# Patient Record
Sex: Male | Born: 1960
Health system: Southern US, Community
[De-identification: ages and names within clinical notes are randomized; demographics above are authoritative.]

## PROBLEM LIST (undated history)

## (undated) DIAGNOSIS — G4733 Obstructive sleep apnea (adult) (pediatric): Secondary | ICD-10-CM

## (undated) DIAGNOSIS — I4891 Unspecified atrial fibrillation: Secondary | ICD-10-CM

## (undated) DIAGNOSIS — I4819 Other persistent atrial fibrillation: Secondary | ICD-10-CM

## (undated) DIAGNOSIS — I519 Heart disease, unspecified: Secondary | ICD-10-CM

## (undated) DIAGNOSIS — Z974 Presence of external hearing-aid: Secondary | ICD-10-CM

## (undated) DIAGNOSIS — M1990 Unspecified osteoarthritis, unspecified site: Secondary | ICD-10-CM

## (undated) DIAGNOSIS — E785 Hyperlipidemia, unspecified: Secondary | ICD-10-CM

## (undated) HISTORY — DX: Other persistent atrial fibrillation: I48.19

## (undated) HISTORY — PX: CARDIAC CATHETERIZATION: SHX172

## (undated) HISTORY — DX: Obstructive sleep apnea (adult) (pediatric): G47.33

---

## 1997-06-16 ENCOUNTER — Other Ambulatory Visit: Admission: RE | Admit: 1997-06-16 | Discharge: 1997-06-16 | Payer: Self-pay | Admitting: Family Medicine

## 2005-04-21 ENCOUNTER — Ambulatory Visit: Payer: Self-pay | Admitting: Cardiology

## 2005-11-25 ENCOUNTER — Emergency Department: Payer: Self-pay | Admitting: Emergency Medicine

## 2005-11-25 IMAGING — CT CT CERVICAL SPINE WITHOUT CONTRAST
3 series · 16 of 33 positions shown, 19 images · non-contrast
Comparison: none

REASON FOR EXAM: fall, neck pain
COMMENTS:

[Series 3: coronal · coronal · 0.36mm/px · 3 of 37 slices shown]
[im 8/37  bone]
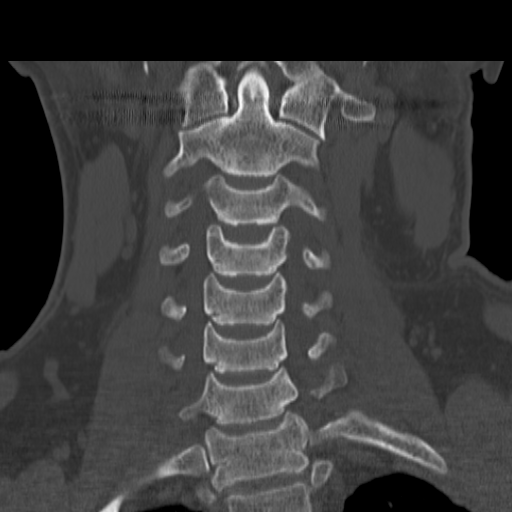
[im 15/37  bone]
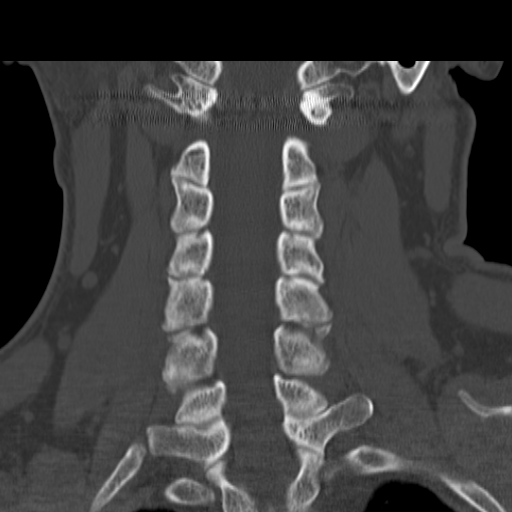
[im 22/37  bone]
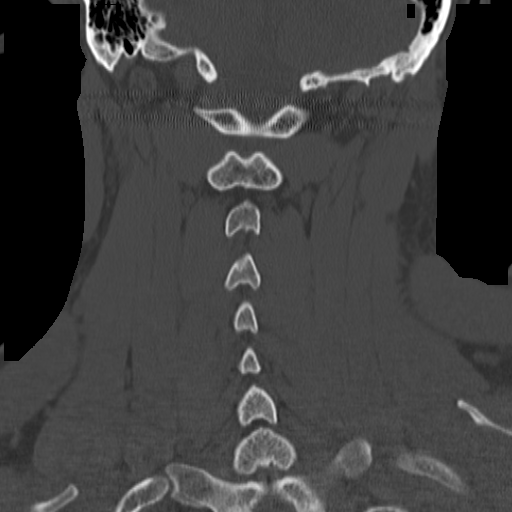

[Series 4: sagittal · sagittal · 0.35mm/px · 5 of 39 slices shown, 6 images]
[im 13/39  bone]
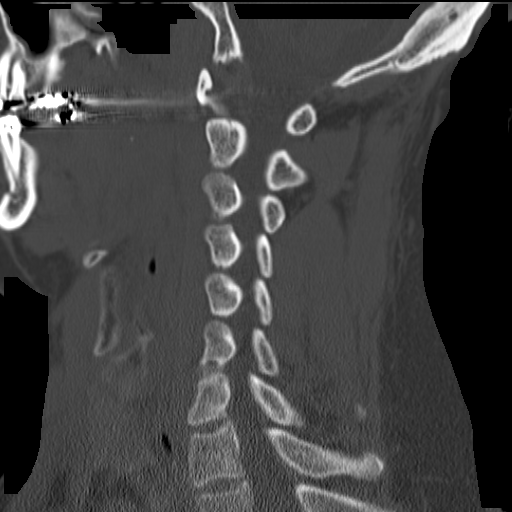
[im 16/39  bone]
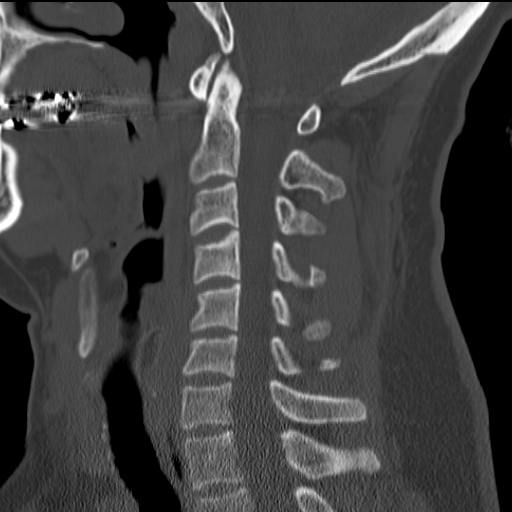
[im 20/39  soft-tissue]
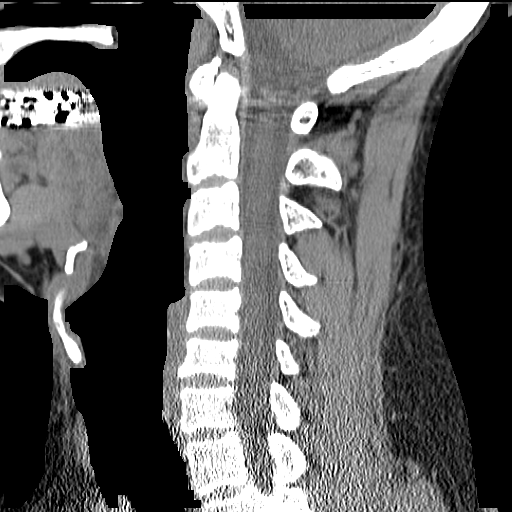
[im 20/39  bone]
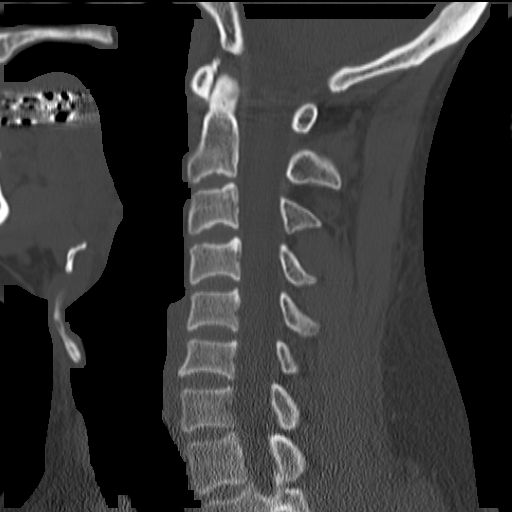
[im 23/39  bone]
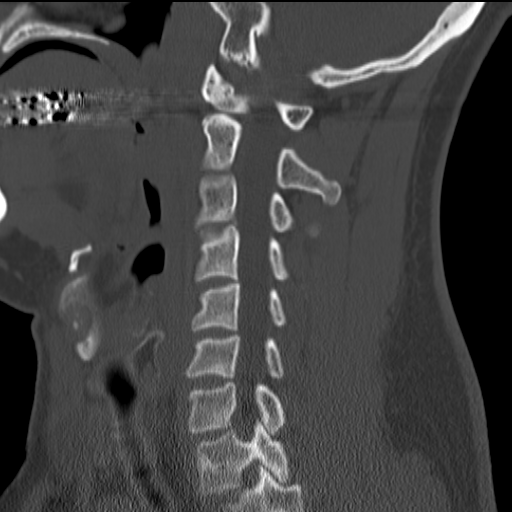
[im 26/39  bone]
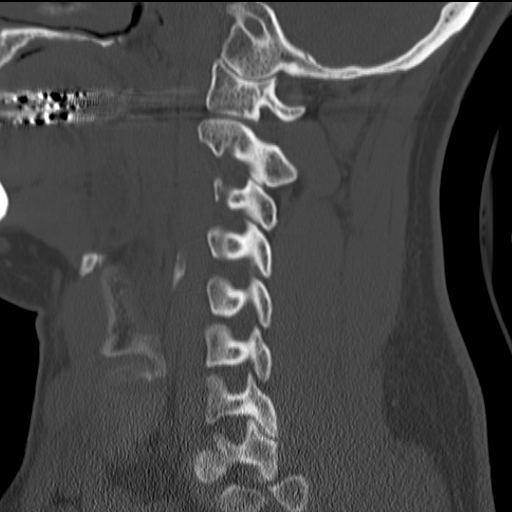

[Series 5: axial · axial · 0.32mm/px · z∈[+748,+901]mm · 8 of 91 slices shown, 10 images]
[im 7/91  soft-tissue]
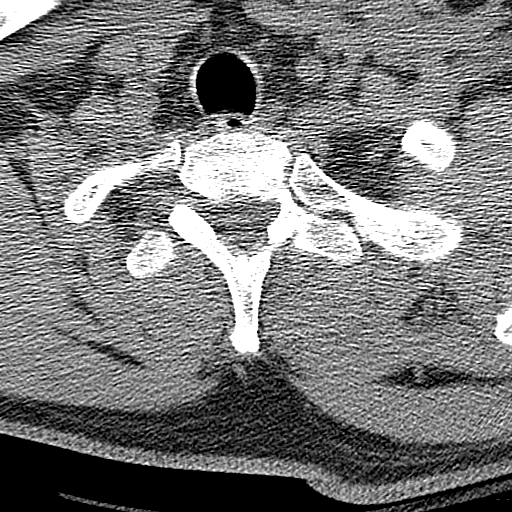
[im 7/91  bone]
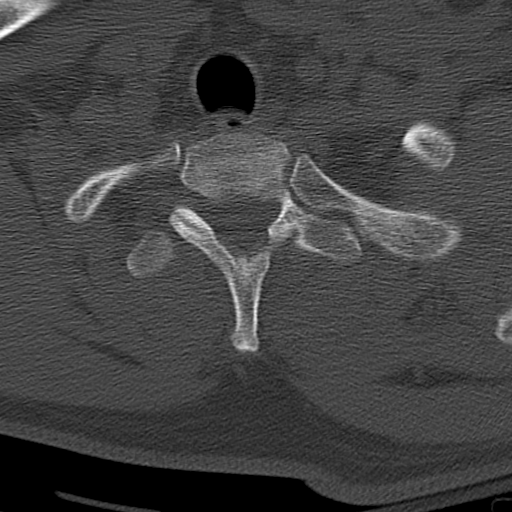
[im 21/91  bone]
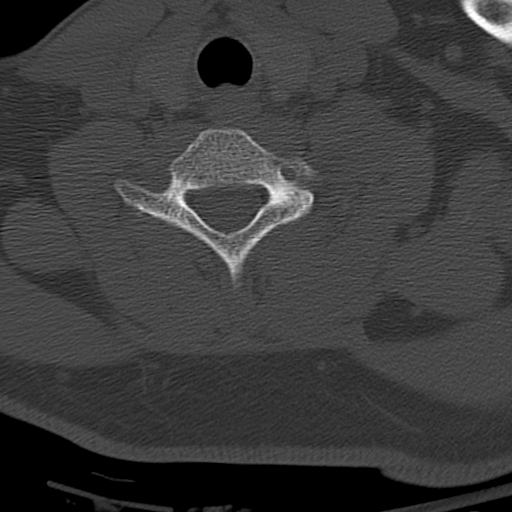
[im 28/91  bone]
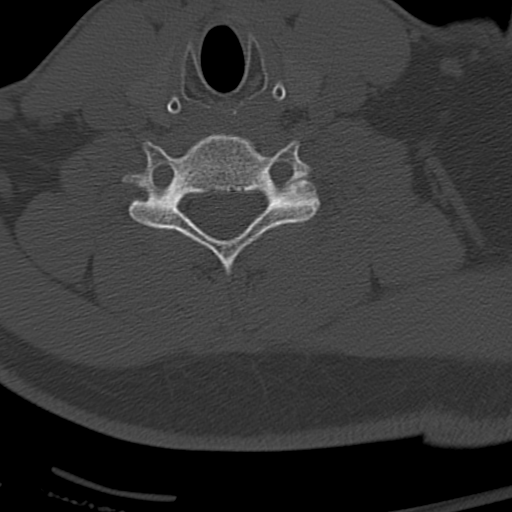
[im 42/91  bone]
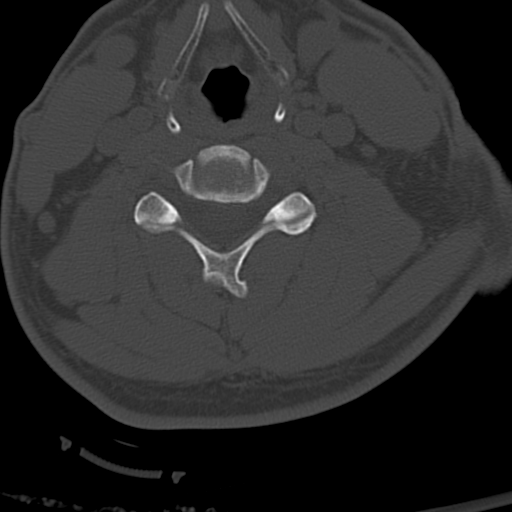
[im 49/91  soft-tissue]
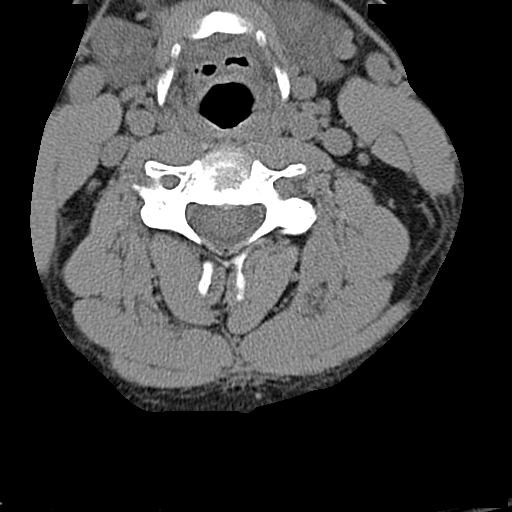
[im 49/91  bone]
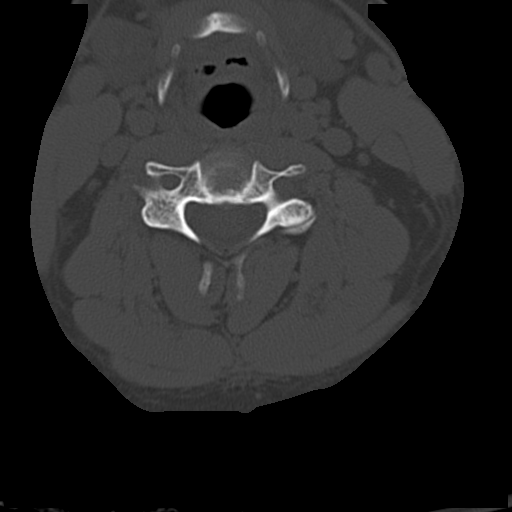
[im 63/91  bone]
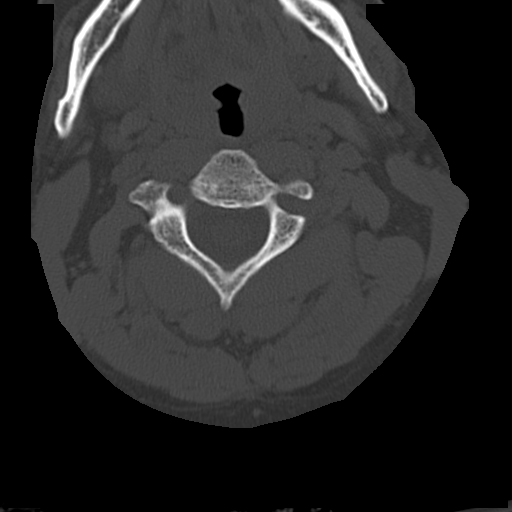
[im 70/91  bone]
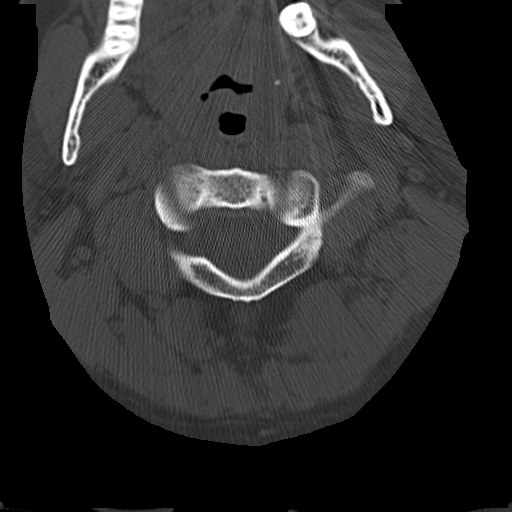
[im 84/91  bone]
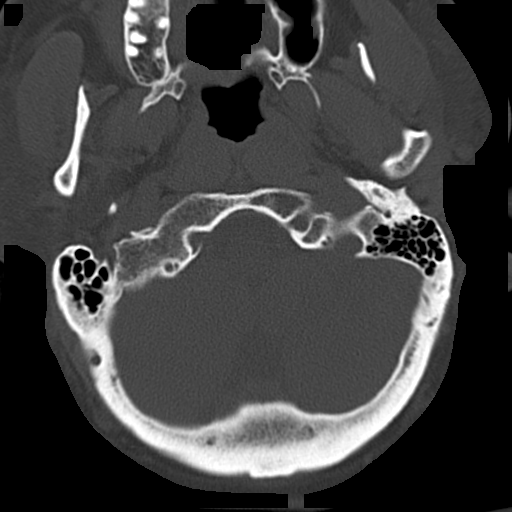

[16 of 33 positions shown; findings below may reference images not displayed]

PROCEDURE:     CT  - CT CERVICAL SPINE WO  - [DATE]  [DATE]

RESULT:          Noncontrast emergent CT scan of the cervical spine is
performed.

The vertebral body heights and intervertebral disc spaces appear to be
maintained.  There is no subluxation.  The prevertebral soft tissues are
normal.  The cervicobasilar relationships (craniocervical junction) appear
to be normal.  The odontoid appears to be intact.  The lateral masses of C1
and C2 appear to be normally aligned.  The head is rotated slightly to the
RIGHT compared to the remaining portions of the spine.
IMPRESSION: No acute bony abnormality.  No findings to suggest
acute cervical spine fracture.

The findings were phoned to the [HOSPITAL] the time of
dictation.

## 2012-08-17 ENCOUNTER — Inpatient Hospital Stay: Payer: Self-pay | Admitting: Internal Medicine

## 2012-08-17 LAB — URINALYSIS, COMPLETE
Bilirubin,UR: NEGATIVE
Glucose,UR: NEGATIVE mg/dL (ref 0–75)
Ketone: NEGATIVE
Leukocyte Esterase: NEGATIVE
Nitrite: NEGATIVE
Specific Gravity: 1.025 (ref 1.003–1.030)

## 2012-08-17 LAB — COMPREHENSIVE METABOLIC PANEL
Albumin: 4 g/dL (ref 3.4–5.0)
Alkaline Phosphatase: 77 U/L (ref 50–136)
Anion Gap: 6 — ABNORMAL LOW (ref 7–16)
BUN: 17 mg/dL (ref 7–18)
Bilirubin,Total: 1 mg/dL (ref 0.2–1.0)
Chloride: 108 mmol/L — ABNORMAL HIGH (ref 98–107)
Creatinine: 0.97 mg/dL (ref 0.60–1.30)
EGFR (Non-African Amer.): >60
Glucose: 95 mg/dL (ref 65–99)
SGOT(AST): 34 U/L (ref 15–37)
SGPT (ALT): 107 U/L — ABNORMAL HIGH (ref 12–78)
Sodium: 138 mmol/L (ref 136–145)
Total Protein: 7.4 g/dL (ref 6.4–8.2)

## 2012-08-17 LAB — CK TOTAL AND CKMB (NOT AT ARMC)
CK, Total: 92 U/L (ref 35–232)
CK-MB: 0.9 ng/mL (ref 0.5–3.6)

## 2012-08-17 LAB — CBC
HCT: 44.9 % (ref 40.0–52.0)
HGB: 15 g/dL (ref 13.0–18.0)
MCH: 30.7 pg (ref 26.0–34.0)
MCHC: 33.5 g/dL (ref 32.0–36.0)
Platelet: 192 10*3/uL (ref 150–440)
RBC: 4.9 10*6/uL (ref 4.40–5.90)
RDW: 14 % (ref 11.5–14.5)

## 2012-08-17 LAB — PRO B NATRIURETIC PEPTIDE: B-Type Natriuretic Peptide: 482 pg/mL — ABNORMAL HIGH (ref 0–125)

## 2012-08-17 LAB — TROPONIN I: Troponin-I: <0.02 ng/mL

## 2012-08-18 LAB — COMPREHENSIVE METABOLIC PANEL
Anion Gap: 8 (ref 7–16)
BUN: 13 mg/dL (ref 7–18)
Bilirubin,Total: 0.6 mg/dL (ref 0.2–1.0)
Calcium, Total: 8.1 mg/dL — ABNORMAL LOW (ref 8.5–10.1)
EGFR (African American): >60
EGFR (Non-African Amer.): >60
Glucose: 102 mg/dL — ABNORMAL HIGH (ref 65–99)
Osmolality: 280 (ref 275–301)
Potassium: 3.7 mmol/L (ref 3.5–5.1)
SGPT (ALT): 75 U/L (ref 12–78)
Sodium: 140 mmol/L (ref 136–145)
Total Protein: 6.1 g/dL — ABNORMAL LOW (ref 6.4–8.2)

## 2012-08-18 LAB — CBC WITH DIFFERENTIAL/PLATELET
Basophil %: 0.6 %
Eosinophil %: 2.6 %
HGB: 13 g/dL (ref 13.0–18.0)
MCHC: 33.4 g/dL (ref 32.0–36.0)
Monocyte #: 0.7 x10 3/mm (ref 0.2–1.0)
Monocyte %: 11.4 %
Neutrophil #: 3.3 10*3/uL (ref 1.4–6.5)
Platelet: 159 10*3/uL (ref 150–440)
RBC: 4.26 10*6/uL — ABNORMAL LOW (ref 4.40–5.90)
WBC: 5.8 10*3/uL (ref 3.8–10.6)

## 2012-08-18 LAB — MAGNESIUM: Magnesium: 1.8 mg/dL

## 2012-08-18 LAB — TROPONIN I: Troponin-I: <0.02 ng/mL

## 2012-08-19 LAB — COMPREHENSIVE METABOLIC PANEL
Albumin: 3.1 g/dL — ABNORMAL LOW (ref 3.4–5.0)
Alkaline Phosphatase: 68 U/L (ref 50–136)
BUN: 15 mg/dL (ref 7–18)
Bilirubin,Total: 0.4 mg/dL (ref 0.2–1.0)
Calcium, Total: 8 mg/dL — ABNORMAL LOW (ref 8.5–10.1)
Co2: 28 mmol/L (ref 21–32)
Creatinine: 1.1 mg/dL (ref 0.60–1.30)
EGFR (African American): >60
Glucose: 85 mg/dL (ref 65–99)
Osmolality: 287 (ref 275–301)
SGOT(AST): 17 U/L (ref 15–37)
SGPT (ALT): 60 U/L (ref 12–78)
Sodium: 144 mmol/L (ref 136–145)

## 2012-08-19 LAB — CBC WITH DIFFERENTIAL/PLATELET
Basophil #: 0 10*3/uL (ref 0.0–0.1)
Basophil %: 0.8 %
Eosinophil #: 0.2 10*3/uL (ref 0.0–0.7)
HCT: 37.5 % — ABNORMAL LOW (ref 40.0–52.0)
HGB: 12.8 g/dL — ABNORMAL LOW (ref 13.0–18.0)
Lymphocyte #: 1.5 10*3/uL (ref 1.0–3.6)
Lymphocyte %: 29.6 %
MCHC: 34 g/dL (ref 32.0–36.0)
MCV: 91 fL (ref 80–100)
Monocyte %: 12.3 %
Neutrophil #: 2.8 10*3/uL (ref 1.4–6.5)
Neutrophil %: 53.9 %
Platelet: 154 10*3/uL (ref 150–440)
RBC: 4.12 10*6/uL — ABNORMAL LOW (ref 4.40–5.90)
RDW: 14 % (ref 11.5–14.5)
WBC: 5.2 10*3/uL (ref 3.8–10.6)

## 2013-05-11 ENCOUNTER — Ambulatory Visit: Payer: Self-pay | Admitting: Internal Medicine

## 2013-07-22 DIAGNOSIS — I429 Cardiomyopathy, unspecified: Secondary | ICD-10-CM | POA: Insufficient documentation

## 2013-07-22 DIAGNOSIS — E782 Mixed hyperlipidemia: Secondary | ICD-10-CM | POA: Insufficient documentation

## 2013-07-22 DIAGNOSIS — K219 Gastro-esophageal reflux disease without esophagitis: Secondary | ICD-10-CM | POA: Insufficient documentation

## 2013-09-06 ENCOUNTER — Observation Stay: Payer: Self-pay | Admitting: Internal Medicine

## 2013-09-06 LAB — BASIC METABOLIC PANEL
Anion Gap: 10 (ref 7–16)
BUN: 17 mg/dL (ref 7–18)
Calcium, Total: 9 mg/dL (ref 8.5–10.1)
Chloride: 109 mmol/L — ABNORMAL HIGH (ref 98–107)
Co2: 19 mmol/L — ABNORMAL LOW (ref 21–32)
Creatinine: 1.35 mg/dL — ABNORMAL HIGH (ref 0.60–1.30)
EGFR (African American): >60
EGFR (Non-African Amer.): >60
Glucose: 174 mg/dL — ABNORMAL HIGH (ref 65–99)
Osmolality: 281 (ref 275–301)
POTASSIUM: 3.8 mmol/L (ref 3.5–5.1)
SODIUM: 138 mmol/L (ref 136–145)

## 2013-09-06 LAB — CBC
HCT: 46.4 % (ref 40.0–52.0)
HGB: 15.1 g/dL (ref 13.0–18.0)
MCH: 30.2 pg (ref 26.0–34.0)
MCHC: 32.5 g/dL (ref 32.0–36.0)
MCV: 93 fL (ref 80–100)
PLATELETS: 236 10*3/uL (ref 150–440)
RBC: 4.99 10*6/uL (ref 4.40–5.90)
RDW: 12.9 % (ref 11.5–14.5)
WBC: 6.4 10*3/uL (ref 3.8–10.6)

## 2013-09-06 LAB — MAGNESIUM: Magnesium: 1.8 mg/dL

## 2013-09-06 LAB — CK TOTAL AND CKMB (NOT AT ARMC)
CK, Total: 112 U/L
CK-MB: 1.1 ng/mL (ref 0.5–3.6)

## 2013-09-06 LAB — APTT: Activated PTT: 33.6 secs (ref 23.6–35.9)

## 2013-09-06 LAB — PROTIME-INR
INR: 1.2
Prothrombin Time: 15.1 secs — ABNORMAL HIGH (ref 11.5–14.7)

## 2013-09-06 LAB — PRO B NATRIURETIC PEPTIDE: B-Type Natriuretic Peptide: 685 pg/mL — ABNORMAL HIGH (ref 0–125)

## 2013-09-06 LAB — HEMOGLOBIN A1C: Hemoglobin A1C: 5.9 % (ref 4.2–6.3)

## 2013-09-06 LAB — TROPONIN I

## 2013-10-13 DIAGNOSIS — G4733 Obstructive sleep apnea (adult) (pediatric): Secondary | ICD-10-CM | POA: Insufficient documentation

## 2014-01-27 DIAGNOSIS — I480 Paroxysmal atrial fibrillation: Secondary | ICD-10-CM | POA: Insufficient documentation

## 2014-03-23 DIAGNOSIS — R001 Bradycardia, unspecified: Secondary | ICD-10-CM | POA: Insufficient documentation

## 2014-06-09 NOTE — Consult Note (Signed)
General Aspect 54 year old male that presented to the office yesterday with new onset A. fib with RVR and evaluated by Dr. Nehemiah Massed.  He was started on Eliquis, and Cardizem  240 mg by mouth x 2 doses.  He returned to the office today, with a systolic blood pressure of 96 and complaining of being dizzy, lightheaded and started complaining of chest pain, diaphoresis, shortness of breath.  Repeat EKG showed ongoing A. fib with RVR and  at 180 bpm.  I discussed with Dr. Nehemiah Massed  who recommended  be sent to the emergency room for admission, continuation of Cardizem with amiodarone drip, and plans for TEE guided cardioversion in the morning. In  the ER patient has shown improvement in rate with IV drugs. blood pressure has improved and cardiac enzymes have been negative.   Physical Exam:  GEN in mild distress   HEENT pink conjunctivae   NECK trachea midline   RESP feels short of breath   CARD Irregular rate and rhythm  Tachycardic  No murmur   ABD denies tenderness   EXTR negative edema   SKIN normal to palpation   NEURO cranial nerves intact, motor/sensory function intact   PSYCH alert, A+O to time, place, person, good insight, anxious   Review of Systems:  Subjective/Chief Complaint Rapid heartbeat associated with lightheadedness shortness of breath, chest pain   General: Weakness  for last 2 weeks   Skin: No Complaints   ENT: No Complaints   Eyes: No Complaints   Neck: No Complaints   Respiratory: Short of breath   Cardiovascular: Chest pain or discomfort  Tightness  Palpitations  Dyspnea   Gastrointestinal: No Complaints   Genitourinary: No Complaints   Vascular: No Complaints   Musculoskeletal: No Complaints   Neurologic: Dizzness   Hematologic: No Complaints   Endocrine: No Complaints   Psychiatric: Nervousness  Anxiety   Review of Systems: All other systems were reviewed and found to be negative   Medications/Allergies Reviewed Medications/Allergies  reviewed   Lab Results: Thyroid:  01-Jul-14 11:49   Thyroid Stimulating Hormone 1.81 (0.45-4.50 (International Unit)  ----------------------- Pregnant patients have  different reference  ranges for TSH:  - - - - - - - - - -  Pregnant, first trimetser:  0.36 - 2.50 uIU/mL)  Hepatic:  01-Jul-14 11:49   Bilirubin, Total 1.0  Alkaline Phosphatase 77  SGPT (ALT)  107  SGOT (AST) 34  Total Protein, Serum 7.4  Albumin, Serum 4.0  Routine Chem:  01-Jul-14 11:49   B-Type Natriuretic Peptide ([facility])  482 (Result(s) reported on 17 Aug 2012 at 12:22PM.)  Glucose, Serum 95  BUN 17  Creatinine (comp) 0.97  Sodium, Serum 138  Potassium, Serum 4.3  Chloride, Serum  108  CO2, Serum 24  Calcium (Total), Serum 8.8  Osmolality (calc) 277  eGFR (African American) >60  eGFR (Non-African American) >60 (eGFR values <54mL/min/1.73 m2 may be an indication of chronic kidney disease (CKD). Calculated eGFR is useful in patients with stable renal function. The eGFR calculation will not be reliable in acutely ill patients when serum creatinine is changing rapidly. It is not useful in  patients on dialysis. The eGFR calculation may not be applicable to patients at the low and high extremes of body sizes, pregnant women, and vegetarians.)  Anion Gap  6  Magnesium, Serum  1.7 (1.8-2.4 THERAPEUTIC RANGE: 4-7 mg/dL TOXIC: > 10 mg/dL  -----------------------)  Cardiac:  01-Jul-14 11:49   CK, Total 92  CPK-MB, Serum 0.9 (Result(s) reported  on 17 Aug 2012 at 12:22PM.)  Troponin I < 0.02 (0.00-0.05 0.05 ng/mL or less: NEGATIVE  Repeat testing in 3-6 hrs  if clinically indicated. >0.05 ng/mL: POTENTIAL  MYOCARDIAL INJURY. Repeat  testing in 3-6 hrs if  clinically indicated. NOTE: An increase or decrease  of 30% or more on serial  testing suggests a  clinically important change)  Routine Hem:  01-Jul-14 11:49   WBC (CBC) 6.2  RBC (CBC) 4.90  Hemoglobin (CBC) 15.0  Hematocrit (CBC) 44.9   Platelet Count (CBC) 192 (Result(s) reported on 17 Aug 2012 at 12:03PM.)  MCV 92  MCH 30.7  MCHC 33.5  RDW 14.0   Radiology Results: XRay:    01-Jul-14 11:54, Chest Portable Single View  Chest Portable Single View   REASON FOR EXAM:    dyspnea  COMMENTS:       PROCEDURE: DXR - DXR PORTABLE CHEST SINGLE VIEW  - Aug 17 2012 11:54AM     RESULT: The lungs are well-expanded. There is no focal infiltrate. The   cardiac silhouette is enlarged. The central pulmonary vascularity is   prominent. There is no interstitial nor alveolar edema or pleural   effusion. The mediastinum is normal in width.    IMPRESSION:  The findings suggest low-grade CHF without significant   pulmonary edema.     Dictation Site: 2    VerifiedBy: DAVID A. Martinique, M.D., MD    No Known Allergies:   Vital Signs/Nurse's Notes: **Vital Signs.:   01-Jul-14 13:10  Vital Signs Type Admission  Temperature Temperature (F) 97.9  Celsius 36.6  Temperature Source oral  Pulse Pulse 277  Systolic BP Systolic BP 824  Diastolic BP (mmHg) Diastolic BP (mmHg) 77  Mean BP 90  Pulse Ox % Pulse Ox % 95  Pulse Ox Activity Level  At rest  Oxygen Delivery Room Air/ 21 %  Pulse Ox Heart Rate 84    Impression New-onset A. fib with RVR,  less than optimal response to outpatient by mouth medication, with unstable symptoms.   Plan 1.  Continue Cardizem drip which after 2 IV doses patient showed some improvement in ventricular response with increase in blood pressure. 2.  Amiodarone drip for hopeful Maddon Horton or help with maintenance of sinus rhythm after  possible cardioversion. 3.  Continue Eloquis  for reduction of stroke risk. 3.  Plans for TEE guided cardioversion  tomorrow a.m. by Dr. Nehemiah Massed.  Patient was seen in collaboration with Dr. Nehemiah Massed.   Electronic Signatures: Roderic Palau (NP)  (Signed 01-Jul-14 15:09)  Authored: General Aspect/Present Illness, History and Physical Exam, Review of System, Labs,  Radiology, Allergies, Vital Signs/Nurse's Notes, Impression/Plan   Last Updated: 01-Jul-14 15:09 by Roderic Palau (NP)

## 2014-06-09 NOTE — H&P (Signed)
PATIENT NAME:  Francisco Reed, Francisco Reed MR#:  [id number] DATE OF BIRTH:  [birth year]  DATE OF ADMISSION:  08/17/2012  FAMILY PHYSICIAN:  Leonie Douglas. Doy Hutching, M.D.   REASON FOR ADMISSION: Rapid atrial fibrillation.   HISTORY OF PRESENT ILLNESS: The patient is a 54 year old male with a history of noncardiac chest pain with cardiac catheterization in 2007, which was normal. He presented to the office yesterday with a 2 week history of shortness of breath, fatigue and abdominal fullness. Was found to be in rapid atrial fibrillation. Was seen by cardiology and started on Cardizem CD and Eliquis. Represented today with persistent symptoms. Remained in rapid A. fib. He is now admitted for further evaluation.   PAST MEDICAL HISTORY:  1.  History of noncardiac chest pain with negative cardiac catheterization in 2007.   MEDICATIONS: 1.  Cardizem CD 240 mg p.o. daily.  2.  Eliquis 5 mg p.o. b.i.d.   ALLERGIES: No known drug allergies.   SOCIAL HISTORY: The patient is married with 2 children. No history of alcohol or tobacco abuse.   FAMILY HISTORY: Positive for coronary artery disease and atrial fibrillation. Negative for prostate or colon cancer.   REVIEW OF SYSTEMS:  CONSTITUTIONAL: No fever or change in weight.  EYES: No blurred or double vision. No glaucoma.  ENT: No tinnitus or hearing loss. No nasal discharge or bleeding. No difficulty swallowing.  RESPIRATORY: The patient has had cough and shortness of breath. No hemoptysis. No painful respiration.  CARDIOVASCULAR: No chest pain. Some orthopnea. Some palpitations. No syncope.  GASTROINTESTINAL: No nausea, vomiting or diarrhea. No change in bowel habits.  GENITOURINARY: No dysuria or hematuria. No incontinence.  ENDOCRINE: No polyuria or polydipsia. No heat or cold intolerance.  HEMATOLOGIC: The patient denies anemia, easy bruising or bleeding.  LYMPHATIC: No swollen glands.  MUSCULOSKELETAL: The patient denies pain in his neck, back, shoulders,  knees, hips. No gout.  NEUROLOGIC: No numbness or migraines. Denies stroke or seizures.  PSYCH: The patient denies anxiety, insomnia or depression.   PHYSICAL EXAMINATION: GENERAL: The patient is in moderate distress.  VITAL SIGNS: Currently remarkable for a blood pressure of 128/82 with a heart rate of 170, which is irregular. Respiratory rate of 20. He is afebrile. Sats 99% on room air.  HEENT: Normocephalic, atraumatic. Pupils equally round and reactive to light and accommodation. Extraocular movements are intact. Sclerae anicteric. Conjunctivae are clear.  Oropharynx is clear.  NECK: Supple without JVD or bruits. No adenopathy or thyromegaly was noted.  LUNGS:  Reveal basilar rhonchi with no wheezes or rales. No dullness. Respiratory effort is mildly increased.  CARDIAC: Irregularly irregular rhythm with a rapid rate. No significant rubs or gallops noted. PMI is nondisplaced. Chest wall is nontender.  ABDOMEN: Soft, nontender with normoactive bowel sounds. No organomegaly or masses were appreciated. No hernias or bruits were noted.  EXTREMITIES: Without clubbing, cyanosis or edema. Pulses were 2+ bilaterally.  SKIN: Warm and dry without rash or lesions.  NEUROLOGIC:  Reveal cranial nerves II through XII grossly intact. Deep tendon reflexes were symmetric. Motor and sensory exam is nonfocal.  PSYCHIATRIC:  Revealed the patient is alert and oriented to person, place and time. He was cooperative and used good judgment.   DIAGNOSTIC, RADIOLOGIC AND LABORATORY DATA: Magnesium was low at 1.7. Glucose 95 with a BUN of 17, creatinine 0.97 with a sodium of 138 and a potassium of 4.3. CBC was within normal limits. EKG: Revealed rapid atrial fibrillation with no acute ischemic changes. Chest x-ray was unremarkable.  ASSESSMENT: 1.  New onset rapid atrial fibrillation.  2.  Hypomagnesemia.  3.  Shortness of breath.  4.  Generalized weakness and fatigue.  5.  History of normal cardiac  catheterization in 2007.   PLAN: The patient will be started on amiodarone and diltiazem drips and admitted to the intensive care unit. We will continue Eliquis at this time. We will follow serial cardiac enzymes. We will obtain a cardiology consult. The patient was seen by Dr. Nehemiah Massed as an outpatient yesterday. The plan is to begin the amiodarone and Cardizem drips and proceed with a transesophageal echo followed by cardioversion tomorrow. We will supplement oxygen at this time empirically and wean as tolerated. Follow up routine labs in the morning. We will supplement his magnesium at this time. Further treatment and evaluation will depend upon the patient's progress.   TOTAL SPENT ON THIS PATIENT: 45 minutes.     ____________________________ Leonie Douglas Doy Hutching, MD jds:dmm D: 08/17/2012 12:21:54 ET T: 08/17/2012 12:39:39 ET JOB#: [id number]  cc: Leonie Douglas. Doy Hutching, MD, <Dictator> Nadyne Gariepy Lennice Sites MD ELECTRONICALLY SIGNED 08/17/2012 14:44

## 2014-06-10 NOTE — Consult Note (Signed)
PATIENT NAME:  Francisco Reed, Francisco Reed MR#:  [id number] DATE OF BIRTH:  1960/07/01  DATE OF CONSULTATION:  09/06/2013  REFERRING PHYSICIAN:  Dr. Doy Hutching.   CARDIOLOGIST:  Dr. Nehemiah Massed.  CONSULTING PHYSICIAN:  Dwayne D. Callwood, MD  INDICATION: Atrial fibrillation.   HISTORY OF PRESENT ILLNESS:  Francisco Reed is a 54 year old white male with known history of hypertension, with atrial fibrillation off and on recently.  He saw Dr. Nehemiah Massed when  his atrial fibrillation became uncontrolled again. Over the last week or so he had been stable.  The patient complains of palpitations, irregular heart beat, tachycardia.  He was seen by Dr. Nehemiah Massed and placed on verapamil, metoprolol and flecainide as well as Eliquis. He was doing reasonably well with reasonable rate control but then started having recurrent tachycardia with heart rates of 180.  He came to the Emergency Room after seeing Dr. Doy Hutching. The patient had some improvement in his rate and now is here with recurrent atrial fibrillation. He has had palpitations, tachycardia, no significant near-syncope, and now needs further evaluation and consideration for cardioversion.   REVIEW OF SYSTEMS: No blackout spells or syncope. Denies nausea or vomiting, had some sweating, weakness, fatigue. No weight loss, no weight gain.  No hemoptysis, no hematemesis. No bright red blood per rectum.   PAST MEDICAL HISTORY: Mild obesity, mild hypertension, paroxysmal atrial fibrillation.  FAMILY HISTORY: Atrial fibrillation, myocardial infarction.   SOCIAL HISTORY: Married. Works. Denies smoking or alcohol consumption.   ALLERGIES: No known drug allergies.  PHYSICAL EXAMINATION:  VITAL SIGNS: Blood pressure 104/90, pulse 77, respiratory rate 14, afebrile.  HEENT: Normocephalic, atraumatic. Pupils equal and reactive to light.  NECK: Supple. No significant JVD, adenopathy.  LUNGS: Clear to auscultation and percussion. No significant wheeze, rhonchi, or rale.  HEART:  Irregularly irregular. Systolic ejection murmur at the apex.  ABDOMEN: Benign. EXTREMITIES: Within normal limits.  NEUROLOGIC: Intact.  SKIN: Normal.   LABORATORY DATA: Troponin less than 0.02, white count 64, H and H 15 and 45, platelet count 236,000, glucose 174, BUN 17, creatinine 1.35, sodium 138, potassium 3.8, PT 15, PTT 33. BNP 685, hemoglobin A1c 5.9.   EKG: Atrial fibrillation, rate of about 80, nonspecific ST-T wave changes.   ASSESSMENT: Atrial fibrillation, difficult to control; palpitations, tachycardia, hypertension.   PLAN:  Agree with admission. Place on telemetry. Continue anticoagulation and consider whether cardioversion is indicated at this point, either with a TEE and/or defer TEE and go directly to cardioversion.  The patient appears to have not been in atrial fibrillation for very long. We will recommend therapy with flecainide. Would consider increasing the dose from 50 twice a day to 100 twice a day. He is on verapamil and metoprolol, appears to be rate-controlled at this point. Continue Eliquis for anticoagulation. Again, I will discuss the case with Dr. Nehemiah Massed.  The goal is probably cardioversion with strong consideration eventually for ablation in this patient since he has been difficult to control.    ____________________________ Loran Senters. Clayborn Bigness, MD ddc:lt D: 09/07/2013 09:19:51 ET T: 09/07/2013 09:59:03 ET JOB#: [id number]  cc: Dwayne D. Clayborn Bigness, MD, <Dictator> Yolonda Kida MD ELECTRONICALLY SIGNED 10/03/2013 10:22

## 2014-06-10 NOTE — H&P (Signed)
PATIENT NAME:  Francisco Reed, Francisco Reed MR#:  [id number] DATE OF BIRTH:  1960-05-22  DATE OF ADMISSION:  09/06/2013  PRIMARY CARE PHYSICIAN:  Dr. Doy Hutching.   CHIEF COMPLAINT:  Palpitations. Not feeling well.   HISTORY OF PRESENT ILLNESS:  Francisco Reed is a 54 year old Caucasian gentleman with past medical history of chronic atrial fibrillation, on p.o. anticoagulation with Eliquis, who comes to the Emergency Room after he started having weakness, felt palpitations.  His heart rate when monitored was up in the 200s.  He has recently had a visit to Dr. Alveria Apley office where verapamil, flecainide and Eliquis were added.  His metoprolol was also increased recently, about 3-4 days ago.  The patient has been feeling weak, was found to have a heart rate in the 200s. When he came to the Emergency Room his heart rate was in the 70s, however, he was hypotensive, had a presyncopal episode without any fall or loss of consciousness at work.  He is being admitted for further evaluation and management.   The patient's chronic atrial fibrillation was diagnosed in July 2014. He had undergone cardioversion last year due to irregular and labile heart rate. The patient at present denies any chest pain or shortness of breath.  His heart rate during my evaluation is 63-70, atrial fibrillation, with blood pressure of 117/51.    PAST MEDICAL HISTORY:  Atrial fibrillation.   ALLERGIES:  No known drug allergies.   MEDICATIONS:   1.  Verapamil 240 mg p.o. daily.  2.  Metoprolol succinate 25 mg b.i.d.  3.  Flecainide 50 mg b.i.d.  4.  Eliquis 5 mg b.i.d.   SOCIAL HISTORY:  The patient works in [employer].   FAMILY HISTORY:  Positive for CAD and atrial fibrillation.   REVIEW OF SYSTEMS:  CONSTITUTIONAL:  No fever. Positive for fatigue, weakness.  EYES:  No blurred or double vision, glaucoma, or cataract.  ENT:  No tinnitus, ear pain or hearing loss. RESPIRATORY: No cough, wheezing, hemoptysis or painful respiratory.   CARDIOVASCULAR: Positive for palpitations and presyncope. No chest pain or orthopnea, edema.  GASTROINTESTINAL: No nausea, vomiting, diarrhea or abdominal pain.  GENITOURINARY: No dysuria, hematuria, or frequency.  ENDOCRINE:  No polyuria, nocturia or thyroid problems.  HEMATOLOGY:  No anemia, easy bruising or bleeding.  SKIN:  No acne, rash or lesion.  NEUROLOGICAL: No CVA, TIA, migraines or dysarthria.  PSYCHIATRIC: No anxiety, depression or bipolar disorder.   All other systems reviewed are negative.   PHYSICAL EXAMINATION:  GENERAL: The patient is awake, alert and oriented x 3. In no acute distress.  VITAL SIGNS:  Afebrile. Pulse 63-77 and regular. Blood pressure 104/87, pulse oximetry 99% on room air.  HEENT:  Atraumatic, normocephalic, PERRLA, OMI intact. Oral mucosa is moist.   NECK:  Supple.  No JVD, no carotid bruits.  RESPIRATORY:  Clear to auscultation bilaterally.  No rales, rhonci, respiratory distress or labored breathing.  CARDIOVASCULAR:  Regular rate and rhythm, no murmur heard. PMI not lateralized.  CHEST:  Nontender.  EXTREMITIES:  Good pedal pulses, good femoral pulses, no lower extremity edema.  ABDOMEN:  Soft, benign, nontender.  No organomegaly.  Positive bowel sounds.  NEUROLOGICAL:  Grossly intact.  Cranial nerves II-XII intact. No motor or sensory deficits.  PSYCHIATRIC: Patient is awake, alert and oriented x 3.   EKG:  Shows chronic atrial fibrillation with heart rate in the 70s.   CHEST X-RAY:  No active disease.   LABORATORY DATA: CBC within normal limits.  Troponin less than  0.02.  Glucose is 174, BUN 17, creatinine 1.35, sodium 138, potassium 3.8, chloride 109, bicarbonate 19, calcium 9.0, PT-INR 15.1/1.2.  B-type natriuretic peptide 685.    ASSESSMENT:  Francisco Reed is a 54 year old male with history of chronic atrial fibrillation comes in with:  1.  Rapid atrial fibrillation with rapid ventricular response, heart rate in the 200s. The patient came  into the Emergency Room.  Heart rate was down in the 70s-80s.  He was asymptomatic, however, earlier did have a presyncopal episode.  The patient had required cardioversion in the past given his labile heart rate. We will admit the patient to the telemetry floor.  The case was discussed with Dr. Clayborn Bigness who will see the patient in consultation.  I will continue his home medications, verapamil, Toprol XL, flecainide and Eliquis. Cycle cardiac enzymes x 3.  I will hold off on any echocardiogram since one was done last year. Will await further cardiology recommendation.  2.  Hyperglycemia. Check A1c, monitor sugars; treatment accordingly.  3.  DVT prophylaxis. Already on Eliquis.  4.  Further workup according to the patient's clinical course. Hospital admission plan was discussed with the patient and his wife.   TIME SPENT:  50 minutes.    ____________________________ Hart Rochester Posey Pronto, MD sap:lt D: 09/06/2013 14:59:00 ET T: 09/06/2013 15:37:25 ET JOB#: [id number]  cc: Leonie Douglas. Doy Hutching, MD Terrian Ridlon A. Posey Pronto, MD, <Dictator>     Ilda Basset MD ELECTRONICALLY SIGNED 09/28/2013 11:31

## 2014-12-28 ENCOUNTER — Encounter
Admission: RE | Disposition: A | Discharge status: Home / Self Care | Payer: Self-pay | Source: Ambulatory Visit | Attending: Internal Medicine

## 2014-12-28 ENCOUNTER — Encounter: Payer: Self-pay | Admitting: Anesthesiology

## 2014-12-28 ENCOUNTER — Ambulatory Visit: Payer: BLUE CROSS/BLUE SHIELD | Admitting: Anesthesiology

## 2014-12-28 ENCOUNTER — Ambulatory Visit
Admission: RE | Admit: 2014-12-28 | Discharge: 2014-12-28 | Disposition: A | Discharge status: Home / Self Care | Payer: BLUE CROSS/BLUE SHIELD | Source: Ambulatory Visit | Attending: Internal Medicine | Admitting: Internal Medicine

## 2014-12-28 DIAGNOSIS — Z79899 Other long term (current) drug therapy: Secondary | ICD-10-CM | POA: Insufficient documentation

## 2014-12-28 DIAGNOSIS — E785 Hyperlipidemia, unspecified: Secondary | ICD-10-CM | POA: Insufficient documentation

## 2014-12-28 DIAGNOSIS — Z8249 Family history of ischemic heart disease and other diseases of the circulatory system: Secondary | ICD-10-CM | POA: Insufficient documentation

## 2014-12-28 DIAGNOSIS — I480 Paroxysmal atrial fibrillation: Secondary | ICD-10-CM | POA: Insufficient documentation

## 2014-12-28 DIAGNOSIS — Z7901 Long term (current) use of anticoagulants: Secondary | ICD-10-CM | POA: Insufficient documentation

## 2014-12-28 DIAGNOSIS — I428 Other cardiomyopathies: Secondary | ICD-10-CM | POA: Insufficient documentation

## 2014-12-28 DIAGNOSIS — R079 Chest pain, unspecified: Secondary | ICD-10-CM | POA: Diagnosis not present

## 2014-12-28 DIAGNOSIS — I4891 Unspecified atrial fibrillation: Secondary | ICD-10-CM | POA: Diagnosis present

## 2014-12-28 HISTORY — PX: ELECTROPHYSIOLOGIC STUDY: SHX172A

## 2014-12-28 HISTORY — DX: Hyperlipidemia, unspecified: E78.5

## 2014-12-28 SURGERY — CARDIOVERSION (CATH LAB)
Anesthesia: General

## 2014-12-28 MED ORDER — FENTANYL CITRATE (PF) 100 MCG/2ML IJ SOLN: 25.0000 ug | INTRAMUSCULAR | Status: DC | PRN

## 2014-12-28 MED ORDER — MIDAZOLAM HCL 2 MG/2ML IJ SOLN
INTRAMUSCULAR | Status: DC | PRN
  Administered 2014-12-28: 1 mg via INTRAVENOUS

## 2014-12-28 MED ORDER — SODIUM CHLORIDE 0.9 % IV SOLN
INTRAVENOUS | Status: DC
Start: 2014-12-28 — End: 2014-12-28
  Administered 2014-12-28 (×2): via INTRAVENOUS

## 2014-12-28 MED ORDER — ONDANSETRON HCL 4 MG/2ML IJ SOLN: 4.0000 mg | Freq: Once | INTRAMUSCULAR | Status: DC | PRN

## 2014-12-28 MED ORDER — PROPOFOL 10 MG/ML IV BOLUS
INTRAVENOUS | Status: DC | PRN
  Administered 2014-12-28 (×2): 40 mg via INTRAVENOUS
  Administered 2014-12-28: 50 mg via INTRAVENOUS

## 2014-12-28 NOTE — Transfer of Care (Signed)
Immediate Anesthesia Transfer of Care Note  Patient: Francisco Reed  Procedure(s) Performed: Procedure(s): CARDIOVERSION (N/A)  Patient Location: PACU  Anesthesia Type:General  Level of Consciousness: awake and sedated  Airway & Oxygen Therapy: Patient Spontanous Breathing  Post-op Assessment: Report given to RN and Post -op Vital signs reviewed and stable  Post vital signs: Reviewed and stable  Last Vitals:  Filed Vitals:   12/28/14 0733  BP: 113/95  Pulse: 108  Resp: 22    Complications: No apparent anesthesia complications

## 2014-12-28 NOTE — Anesthesia Postprocedure Evaluation (Signed)
  Anesthesia Post-op Note  Patient: Francisco Reed  Procedure(s) Performed: Procedure(s): CARDIOVERSION (N/A)  Anesthesia type:General  Patient location: PACU  Post pain: Pain level controlled  Post assessment: Post-op Vital signs reviewed, Patient's Cardiovascular Status Stable, Respiratory Function Stable, Patent Airway and No signs of Nausea or vomiting  Post vital signs: Reviewed and stable  Last Vitals:  Filed Vitals:   12/28/14 0907  BP: 109/86  Pulse: 58  Resp: 16    Level of consciousness: awake, alert  and patient cooperative  Complications: No apparent anesthesia complications

## 2014-12-28 NOTE — Discharge Instructions (Signed)
Electrical Cardioversion, Care After °Refer to this sheet in the next few weeks. These instructions provide you with information on caring for yourself after your procedure. Your health care provider may also give you more specific instructions. Your treatment has been planned according to current medical practices, but problems sometimes occur. Call your health care provider if you have any problems or questions after your procedure. °WHAT TO EXPECT AFTER THE PROCEDURE °After your procedure, it is typical to have the following sensations: °· Some redness on the skin where the shocks were delivered. If this is tender, a sunburn lotion or hydrocortisone cream may help. °· Possible return of an abnormal heart rhythm within hours or days after the procedure. °HOME CARE INSTRUCTIONS °· Take medicines only as directed by your health care provider. Be sure you understand how and when to take your medicine. °· Learn how to feel your pulse and check it often. °· Limit your activity for 48 hours after the procedure or as directed by your health care provider. °· Avoid or minimize caffeine and other stimulants as directed by your health care provider. °SEEK MEDICAL CARE IF: °· You feel like your heart is beating too fast or your pulse is not regular. °· You have any questions about your medicines. °· You have bleeding that will not stop. °SEEK IMMEDIATE MEDICAL CARE IF: °· You are dizzy or feel faint. °· It is hard to breathe or you feel short of breath. °· There is a change in discomfort in your chest. °· Your speech is slurred or you have trouble moving an arm or leg on one side of your body. °· You get a serious muscle cramp that does not go away. °· Your fingers or toes turn cold or blue. °  °This information is not intended to replace advice given to you by your health care provider. Make sure you discuss any questions you have with your health care provider. °  °Document Released: 11/24/2012 Document Revised: 02/24/2014  Document Reviewed: 11/24/2012 °Elsevier Interactive Patient Education ©2016 [employer] ° °

## 2014-12-28 NOTE — CV Procedure (Signed)
Electrical Cardioversion Procedure Note QUINNTON SCHILZ [id number] 1960/03/31  Procedure: Electrical Cardioversion Indications:  Atrial Fibrillation  Procedure Details Consent: Risks of procedure as well as the alternatives and risks of each were explained to the (patient/caregiver).  Consent for procedure obtained. Time Out: Verified patient identification, verified procedure, site/side was marked, verified correct patient position, special equipment/implants available, medications/allergies/relevent history reviewed, required imaging and test results available.  Performed  Patient placed on cardiac monitor, pulse oximetry, supplemental oxygen as necessary.  Sedation given: Short-acting barbiturates Pacer pads placed anterior and posterior chest.  Cardioverted 1 time(s).  Cardioverted at 120J.  Evaluation Findings: Post procedure EKG shows: NSR Complications: None Patient did tolerate procedure well.   Corey Skains 12/28/2014, 7:57 AM

## 2014-12-28 NOTE — Anesthesia Preprocedure Evaluation (Addendum)
Anesthesia Evaluation  Patient identified by MRN, date of birth, ID band Patient awake    Reviewed: Allergy & Precautions, NPO status , Patient's Chart, lab work & pertinent test results, reviewed documented beta blocker date and time   Airway Mallampati: II  TM Distance: >3 FB Neck ROM: Full    Dental no notable dental hx.    Pulmonary neg pulmonary ROS,    Pulmonary exam normal        Cardiovascular hypertension, Pt. on medications + angina Normal cardiovascular exam+ dysrhythmias Atrial Fibrillation   cardiomyopathy   Neuro/Psych negative neurological ROS  negative psych ROS   GI/Hepatic negative GI ROS, Neg liver ROS,   Endo/Other  negative endocrine ROS  Renal/GU negative Renal ROS  negative genitourinary   Musculoskeletal negative musculoskeletal ROS (+)   Abdominal Normal abdominal exam  (+)   Peds negative pediatric ROS (+)  Hematology negative hematology ROS (+)   Anesthesia Other Findings   Reproductive/Obstetrics                             Anesthesia Physical Anesthesia Plan  ASA: III  Anesthesia Plan: General   Post-op Pain Management:    Induction: Intravenous  Airway Management Planned: Nasal Cannula  Additional Equipment:   Intra-op Plan:   Post-operative Plan:   Informed Consent: I have reviewed the patients History and Physical, chart, labs and discussed the procedure including the risks, benefits and alternatives for the proposed anesthesia with the patient or authorized representative who has indicated his/her understanding and acceptance.   Dental advisory given  Plan Discussed with: CRNA  Anesthesia Plan Comments:         Anesthesia Quick Evaluation

## 2015-01-24 ENCOUNTER — Ambulatory Visit: Payer: BLUE CROSS/BLUE SHIELD | Attending: Internal Medicine

## 2015-01-24 DIAGNOSIS — G4733 Obstructive sleep apnea (adult) (pediatric): Secondary | ICD-10-CM | POA: Diagnosis not present

## 2015-09-07 ENCOUNTER — Ambulatory Visit
Admission: RE | Admit: 2015-09-07 | Discharge: 2015-09-07 | Disposition: A | Discharge status: Home / Self Care | Payer: BLUE CROSS/BLUE SHIELD | Source: Ambulatory Visit | Attending: Internal Medicine | Admitting: Internal Medicine

## 2015-09-07 ENCOUNTER — Ambulatory Visit: Payer: BLUE CROSS/BLUE SHIELD | Admitting: Anesthesiology

## 2015-09-07 ENCOUNTER — Encounter
Admission: RE | Disposition: A | Discharge status: Home / Self Care | Payer: Self-pay | Source: Ambulatory Visit | Attending: Internal Medicine

## 2015-09-07 ENCOUNTER — Encounter: Payer: Self-pay | Admitting: *Deleted

## 2015-09-07 DIAGNOSIS — Z841 Family history of disorders of kidney and ureter: Secondary | ICD-10-CM | POA: Insufficient documentation

## 2015-09-07 DIAGNOSIS — Z79899 Other long term (current) drug therapy: Secondary | ICD-10-CM | POA: Diagnosis not present

## 2015-09-07 DIAGNOSIS — I4891 Unspecified atrial fibrillation: Secondary | ICD-10-CM | POA: Diagnosis present

## 2015-09-07 DIAGNOSIS — I209 Angina pectoris, unspecified: Secondary | ICD-10-CM | POA: Diagnosis not present

## 2015-09-07 DIAGNOSIS — R001 Bradycardia, unspecified: Secondary | ICD-10-CM | POA: Diagnosis not present

## 2015-09-07 DIAGNOSIS — E785 Hyperlipidemia, unspecified: Secondary | ICD-10-CM | POA: Diagnosis not present

## 2015-09-07 DIAGNOSIS — G4733 Obstructive sleep apnea (adult) (pediatric): Secondary | ICD-10-CM | POA: Diagnosis not present

## 2015-09-07 DIAGNOSIS — I480 Paroxysmal atrial fibrillation: Secondary | ICD-10-CM | POA: Insufficient documentation

## 2015-09-07 DIAGNOSIS — Z8249 Family history of ischemic heart disease and other diseases of the circulatory system: Secondary | ICD-10-CM | POA: Insufficient documentation

## 2015-09-07 DIAGNOSIS — I429 Cardiomyopathy, unspecified: Secondary | ICD-10-CM | POA: Diagnosis not present

## 2015-09-07 DIAGNOSIS — K219 Gastro-esophageal reflux disease without esophagitis: Secondary | ICD-10-CM | POA: Insufficient documentation

## 2015-09-07 DIAGNOSIS — Z809 Family history of malignant neoplasm, unspecified: Secondary | ICD-10-CM | POA: Insufficient documentation

## 2015-09-07 HISTORY — PX: TEE WITHOUT CARDIOVERSION: SHX5443

## 2015-09-07 HISTORY — PX: ELECTROPHYSIOLOGIC STUDY: SHX172A

## 2015-09-07 SURGERY — ECHOCARDIOGRAM, TRANSESOPHAGEAL
Anesthesia: General

## 2015-09-07 MED ORDER — BUTAMBEN-TETRACAINE-BENZOCAINE 2-2-14 % EX AERO
INHALATION_SPRAY | CUTANEOUS | Status: AC
  Filled 2015-09-07: qty 20

## 2015-09-07 MED ORDER — LIDOCAINE VISCOUS 2 % MT SOLN
OROMUCOSAL | Status: AC
  Filled 2015-09-07: qty 15

## 2015-09-07 MED ORDER — PROPOFOL 10 MG/ML IV BOLUS
INTRAVENOUS | Status: DC | PRN
  Administered 2015-09-07: 20 mg via INTRAVENOUS
  Administered 2015-09-07: 80 mg via INTRAVENOUS
  Administered 2015-09-07 (×2): 20 mg via INTRAVENOUS
  Administered 2015-09-07: 10 mg via INTRAVENOUS

## 2015-09-07 MED ORDER — SODIUM CHLORIDE 0.9 % IV SOLN: INTRAVENOUS | Status: DC

## 2015-09-07 MED ORDER — SODIUM CHLORIDE 0.9 % IV SOLN
INTRAVENOUS | Status: DC
  Administered 2015-09-07: 08:00:00 via INTRAVENOUS

## 2015-09-07 MED ORDER — WHITE PETROLATUM GEL
Status: AC
  Filled 2015-09-07: qty 5

## 2015-09-07 NOTE — CV Procedure (Signed)
Electrical Cardioversion Procedure Note Francisco Reed [id number] 1960/05/10  Procedure: Electrical Cardioversion Indications:  Atrial Fibrillation  Procedure Details Consent: Risks of procedure as well as the alternatives and risks of each were explained to the (patient/caregiver).  Consent for procedure obtained. Time Out: Verified patient identification, verified procedure, site/side was marked, verified correct patient position, special equipment/implants available, medications/allergies/relevent history reviewed, required imaging and test results available.  Performed  Patient placed on cardiac monitor, pulse oximetry, supplemental oxygen as necessary.  Sedation given: Benzodiazepines and Short-acting barbiturates Pacer pads placed anterior and posterior chest.  Cardioverted 1 time(s).  Cardioverted at 120J.  Evaluation Findings: Post procedure EKG shows: NSR Complications: None Patient did tolerate procedure well.   Corey Skains 09/07/2015, 8:03 AM

## 2015-09-07 NOTE — Transfer of Care (Signed)
Immediate Anesthesia Transfer of Care Note  Patient: Francisco Reed  Procedure(s) Performed: Procedure(s): TRANSESOPHAGEAL ECHOCARDIOGRAM (TEE) (N/A) CARDIOVERSION (N/A)  Patient Location: PACU and Cath Lab  Anesthesia Type:General  Level of Consciousness: lethargic and responds to stimulation  Airway & Oxygen Therapy: Patient Spontanous Breathing and Patient connected to nasal cannula oxygen  Post-op Assessment: Report given to RN and Post -op Vital signs reviewed and stable  Post vital signs: Reviewed and stable  Last Vitals:  Filed Vitals:   09/07/15 0809 09/07/15 0810  BP:  96/66  Pulse: 50   Temp:    Resp: 15 14    Last Pain: There were no vitals filed for this visit.       Complications: No apparent anesthesia complications

## 2015-09-07 NOTE — Anesthesia Preprocedure Evaluation (Signed)
Anesthesia Evaluation  Patient identified by MRN, date of birth, ID band Patient awake    Reviewed: Allergy & Precautions, NPO status , Patient's Chart, lab work & pertinent test results, reviewed documented beta blocker date and time   History of Anesthesia Complications Negative for: history of anesthetic complications  Airway Mallampati: II  TM Distance: >3 FB Neck ROM: Full    Dental no notable dental hx. (+) Poor Dentition, Chipped   Pulmonary neg pulmonary ROS,    Pulmonary exam normal breath sounds clear to auscultation       Cardiovascular Exercise Tolerance: Poor hypertension, Pt. on medications + angina Normal cardiovascular exam+ dysrhythmias Atrial Fibrillation  Rhythm:irregular Rate:Normal  cardiomyopathy   Neuro/Psych negative neurological ROS  negative psych ROS   GI/Hepatic negative GI ROS, Neg liver ROS, neg GERD  ,  Endo/Other  negative endocrine ROS  Renal/GU negative Renal ROS  negative genitourinary   Musculoskeletal negative musculoskeletal ROS (+)   Abdominal Normal abdominal exam  (+)   Peds negative pediatric ROS (+)  Hematology negative hematology ROS (+)   Anesthesia Other Findings Past Medical History:   Atrial fibrillation (HCC)                                    Cardiomyopathy (HCC)                                         Anginal pain ([place])                                           Hyperlipemia                                                 Reproductive/Obstetrics                             Anesthesia Physical  Anesthesia Plan  ASA: IV  Anesthesia Plan: General   Post-op Pain Management:    Induction: Intravenous  Airway Management Planned: Nasal Cannula  Additional Equipment:   Intra-op Plan:   Post-operative Plan:   Informed Consent: I have reviewed the patients History and Physical, chart, labs and discussed the procedure including the  risks, benefits and alternatives for the proposed anesthesia with the patient or authorized representative who has indicated his/her understanding and acceptance.   Dental advisory given  Plan Discussed with: CRNA  Anesthesia Plan Comments:         Anesthesia Quick Evaluation

## 2015-09-07 NOTE — Discharge Instructions (Signed)
Electrical Cardioversion, Care After °Refer to this sheet in the next few weeks. These instructions provide you with information on caring for yourself after your procedure. Your health care provider may also give you more specific instructions. Your treatment has been planned according to current medical practices, but problems sometimes occur. Call your health care provider if you have any problems or questions after your procedure. °WHAT TO EXPECT AFTER THE PROCEDURE °After your procedure, it is typical to have the following sensations: °· Some redness on the skin where the shocks were delivered. If this is tender, a sunburn lotion or hydrocortisone cream may help. °· Possible return of an abnormal heart rhythm within hours or days after the procedure. °HOME CARE INSTRUCTIONS °· Take medicines only as directed by your health care provider. Be sure you understand how and when to take your medicine. °· Learn how to feel your pulse and check it often. °· Limit your activity for 48 hours after the procedure or as directed by your health care provider. °· Avoid or minimize caffeine and other stimulants as directed by your health care provider. °SEEK MEDICAL CARE IF: °· You feel like your heart is beating too fast or your pulse is not regular. °· You have any questions about your medicines. °· You have bleeding that will not stop. °SEEK IMMEDIATE MEDICAL CARE IF: °· You are dizzy or feel faint. °· It is hard to breathe or you feel short of breath. °· There is a change in discomfort in your chest. °· Your speech is slurred or you have trouble moving an arm or leg on one side of your body. °· You get a serious muscle cramp that does not go away. °· Your fingers or toes turn cold or blue. °  °This information is not intended to replace advice given to you by your health care provider. Make sure you discuss any questions you have with your health care provider. °  °Document Released: 11/24/2012 Document Revised: 02/24/2014  Document Reviewed: 11/24/2012 °Elsevier Interactive Patient Education ©2016 [employer] ° °

## 2015-09-07 NOTE — Progress Notes (Signed)
*  PRELIMINARY RESULTS* Echocardiogram Echocardiogram Transesophageal has been performed.  Francisco Reed 09/07/2015, 8:11 AM

## 2015-09-07 NOTE — Anesthesia Postprocedure Evaluation (Signed)
Anesthesia Post Note  Patient: Francisco Reed  Procedure(s) Performed: Procedure(s) (LRB): TRANSESOPHAGEAL ECHOCARDIOGRAM (TEE) (N/A) CARDIOVERSION (N/A)  Patient location during evaluation: Cath Lab Anesthesia Type: General Level of consciousness: awake and alert Pain management: pain level controlled Vital Signs Assessment: post-procedure vital signs reviewed and stable Respiratory status: spontaneous breathing, nonlabored ventilation, respiratory function stable and patient connected to nasal cannula oxygen Cardiovascular status: blood pressure returned to baseline and stable Postop Assessment: no signs of nausea or vomiting Anesthetic complications: no    Last Vitals:  Filed Vitals:   09/07/15 0925 09/07/15 0930  BP:    Pulse: 46   Temp:    Resp: 22 21    Last Pain: There were no vitals filed for this visit.               Precious Haws Piscitello

## 2015-09-19 DIAGNOSIS — R600 Localized edema: Secondary | ICD-10-CM | POA: Insufficient documentation

## 2016-02-25 ENCOUNTER — Ambulatory Visit (INDEPENDENT_AMBULATORY_CARE_PROVIDER_SITE_OTHER): Payer: BLUE CROSS/BLUE SHIELD | Admitting: Internal Medicine

## 2016-02-25 ENCOUNTER — Encounter: Payer: Self-pay | Admitting: Internal Medicine

## 2016-02-25 VITALS — BP 100/74 | HR 127 | Ht 70.0 in | Wt 223.4 lb

## 2016-02-25 DIAGNOSIS — I4891 Unspecified atrial fibrillation: Secondary | ICD-10-CM | POA: Diagnosis not present

## 2016-02-25 DIAGNOSIS — I481 Persistent atrial fibrillation: Secondary | ICD-10-CM

## 2016-02-25 DIAGNOSIS — G4733 Obstructive sleep apnea (adult) (pediatric): Secondary | ICD-10-CM

## 2016-02-25 DIAGNOSIS — I4819 Other persistent atrial fibrillation: Secondary | ICD-10-CM

## 2016-02-25 NOTE — Progress Notes (Signed)
Electrophysiology Office Note   Date:  02/25/2016   ID:  [name], DOB 10/30/1960, MRN [id number]  PCP:  Idelle Crouch, MD  Cardiologist:  Dr Nehemiah Massed Primary Electrophysiologist: [name], MD    Chief Complaint  Patient presents with  . Advice Only     History of Present Illness: Francisco Reed is a 56 y.o. male who presents today for electrophysiology evaluation.   The patient reports initially having atrial fibrillation in 2013 after presenting with symptoms of CHF.  His EF was 40-45% at that time.  He has had 4 episodes of afib (typcally about once per year) in the setting of significant stress.  He reports symptoms of fatigue and decreased exercise tolerance during his afib.  He has failed medical therapy with multaq.  He has also taken metoprolol and diltiazem.  He is anticoagulated with eliquis.  He has required cardioversion 4 times (most recently 8/17).  He saw Dr Dwana Curd for consideration of ablation at [facility] in 2016 but opted for ongoing medical therapy. Today, he is in afib.  He is not sure howe long he has been in afib currently.  Today, he denies symptoms of orthopnea, PND, lower extremity edema, claudication, dizziness, presyncope, syncope, bleeding, or neurologic sequela. The patient is tolerating medications without difficulties and is otherwise without complaint today.    Past Medical History:  Diagnosis Date  . Hyperlipemia    pt denies  . Obstructive sleep apnea    mild  . Persistent atrial fibrillation [facility])    Past Surgical History:  Procedure Laterality Date  . CARDIOVERSION    . ELECTROPHYSIOLOGIC STUDY N/A 12/28/2014   Procedure: CARDIOVERSION;  Surgeon: Corey Skains, MD;  Location: [facility] ORS;  Service: Cardiovascular;  Laterality: N/A;  . ELECTROPHYSIOLOGIC STUDY N/A 09/07/2015   Procedure: CARDIOVERSION;  Surgeon: Corey Skains, MD;  Location: [facility] ORS;  Service: Cardiovascular;  Laterality: N/A;  . TEE WITHOUT CARDIOVERSION N/A  09/07/2015   Procedure: TRANSESOPHAGEAL ECHOCARDIOGRAM (TEE);  Surgeon: Corey Skains, MD;  Location: [facility] ORS;  Service: Cardiovascular;  Laterality: N/A;     Current Outpatient Prescriptions  Medication Sig Dispense Refill  . magnesium oxide (MAG-OX) 400 MG tablet Take 400 mg by mouth daily.    Marland Kitchen apixaban (ELIQUIS) 5 MG TABS tablet Take 5 mg by mouth 2 (two) times daily.    Marland Kitchen ascorbic acid (VITAMIN C) 1000 MG tablet Take 1,000 mg by mouth daily.    Marland Kitchen diltiazem (DILACOR XR) 240 MG 24 hr capsule Take 240 mg by mouth daily.    Marland Kitchen dronedarone (MULTAQ) 400 MG tablet Take 400 mg by mouth 2 (two) times daily with a meal.    . metoprolol succinate (TOPROL-XL) 100 MG 24 hr tablet Take by mouth daily. Pt takes 100 mg in the morning and 50 mg at bedtime. Take with or immediately following a meal.    . Multiple Vitamin (MULTIVITAMIN) tablet Take 1 tablet by mouth daily.     No current facility-administered medications for this visit.     Allergies:   Patient has no allergy information on record.   Social History:  The patient  reports that he has never smoked. He has never used smokeless tobacco. He reports that he does not drink alcohol or use drugs.   Family History:  The patients father had afib.  He has two brothers with afib also.   ROS:  Please see the history of present illness.   All other systems are reviewed  and negative.    PHYSICAL EXAM: VS:  BP 100/74   Pulse (!) 127   Ht 5\' 10"  (1.778 m)   Wt 223 lb 6.4 oz (101.3 kg)   BMI 32.05 kg/m  , BMI Body mass index is 32.05 kg/m. GEN: Well nourished, well developed, in no acute distress  HEENT: normal  Neck: no JVD, carotid bruits, or masses Cardiac: iRRR; no murmurs, rubs, or gallops,no edema  Respiratory:  clear to auscultation bilaterally, normal work of breathing GI: soft, nontender, nondistended, + BS MS: no deformity or atrophy  Skin: warm and dry  Neuro:  Strength and sensation are intact Psych: euthymic mood, full  affect  EKG:  EKG is ordered today. The ekg ordered today shows afib, V rate 127 bpm, nonspecific St/T changes  Wt Readings from Last 3 Encounters:  02/25/16 223 lb 6.4 oz (101.3 kg)  09/07/15 223 lb (101.2 kg)      Other studies Reviewed: Additional studies/ records that were reviewed today include: Dr Fabiola Backer notes, Dr Dwana Curd notes  Review of the above records today demonstrates: as above   ASSESSMENT AND PLAN:  1.  Persistent atrial fibrillation The patient has symptomatic persistent afib.  He has failed medical therapy with metoprolol, diltiazem, and multaq. Therapeutic strategies for afib including medicine and ablation were discussed in detail with the patient today. Risk, benefits, and alternatives to EP study and radiofrequency ablation for afib were also discussed in detail today. These risks include but are not limited to stroke, bleeding, vascular damage, tamponade, perforation, damage to the esophagus, lungs, and other structures, pulmonary vein stenosis, worsening renal function, and death. The patient understands these risk and wishes to proceed.  We will therefore proceed with catheter ablation at the next available time.  Will proceed with TEE prior to ablation.  Stop multaq  2. OSA Mild and does not require CPAP   Current medicines are reviewed at length with the patient today.   The patient does not have concerns regarding his medicines.  The following changes were made today:  none   Signed, [name], MD  02/25/2016 12:41 PM     [address] [address] [phone] (office) [phone] (fax)

## 2016-02-25 NOTE — Patient Instructions (Addendum)
   Medication Instructions:  Your physician has recommended you make the following change in your medication:  1) Stop Multaq   Labwork: Your physician recommends that you return for lab work on 03/11/16--you do not have to fast    Testing/Procedures: Your physician has requested that you have a TEE. During a TEE, sound waves are used to create images of your heart. It provides your doctor with information about the size and shape of your heart and how well your heart's chambers and valves are working. In this test, a transducer is attached to the end of a flexible tube that's guided down your throat and into your esophagus (the tube leading from you mouth to your stomach) to get a more detailed image of your heart. You are not awake for the procedure. Please see the instruction sheet given to you today. For further information please visit [email or web address]/   Please arrive at [facility] at 8:30am Do not eat or drink after midnight the night prior to the procedure Okay to take medications the morning of the procedure Will need someone to drive you home after    Your physician has recommended that you have an ablation. Catheter ablation is a medical procedure used to treat some cardiac arrhythmias (irregular heartbeats). During catheter ablation, a long, thin, flexible tube is put into a blood vessel in your groin (upper thigh), or neck. This tube is called an ablation catheter. It is then guided to your heart through the blood vessel. Radio frequency waves destroy small areas of heart tissue where abnormal heartbeats may cause an arrhythmia to start. Please see the instruction sheet given to you today.---03/18/16  Please arrive at [facility] at 5:30am Do not eat or drink after midnight the night prior to the procedure Do not take medications the morning of the procedure Plan for one night stay  and will need someone to drive you home at discharge  Follow-Up:  Your physician recommends that you schedule a follow-up appointment in: 4 weeks from 03/18/16 with Roderic Palau, NP in [place] clinic and 3 months from 03/18/16 with Dr Rayann Heman   Any Other Special Instructions Will Be Listed Below (If Applicable).     If you need a refill on your cardiac medications before your next appointment, please call your pharmacy.

## 2016-03-10 ENCOUNTER — Other Ambulatory Visit: Payer: BLUE CROSS/BLUE SHIELD | Admitting: *Deleted

## 2016-03-10 DIAGNOSIS — I4891 Unspecified atrial fibrillation: Secondary | ICD-10-CM

## 2016-03-11 LAB — BASIC METABOLIC PANEL
BUN / CREAT RATIO: 10 (ref 9–20)
BUN: 11 mg/dL (ref 6–24)
CALCIUM: 8.6 mg/dL — AB (ref 8.7–10.2)
CHLORIDE: 103 mmol/L (ref 96–106)
CO2: 24 mmol/L (ref 18–29)
Creatinine, Ser: 1.06 mg/dL (ref 0.76–1.27)
GFR, EST AFRICAN AMERICAN: 92 mL/min/{1.73_m2} (ref >59)
GFR, EST NON AFRICAN AMERICAN: 79 mL/min/{1.73_m2} (ref >59)
Glucose: 95 mg/dL (ref 65–99)
POTASSIUM: 5.1 mmol/L (ref 3.5–5.2)
Sodium: 140 mmol/L (ref 134–144)

## 2016-03-11 LAB — CBC WITH DIFFERENTIAL/PLATELET
BASOS ABS: 0 10*3/uL (ref 0.0–0.2)
BASOS: 0 %
EOS (ABSOLUTE): 0.2 10*3/uL (ref 0.0–0.4)
Eos: 3 %
HEMOGLOBIN: 15.1 g/dL (ref 13.0–17.7)
Hematocrit: 44 % (ref 37.5–51.0)
IMMATURE GRANS (ABS): 0 10*3/uL (ref 0.0–0.1)
Immature Granulocytes: 0 %
LYMPHS ABS: 2.3 10*3/uL (ref 0.7–3.1)
Lymphs: 40 %
MCH: 31 pg (ref 26.6–33.0)
MCHC: 34.3 g/dL (ref 31.5–35.7)
MCV: 90 fL (ref 79–97)
MONOCYTES: 13 %
Monocytes Absolute: 0.8 10*3/uL (ref 0.1–0.9)
NEUTROS ABS: 2.5 10*3/uL (ref 1.4–7.0)
Neutrophils: 44 %
Platelets: 203 10*3/uL (ref 150–379)
RBC: 4.87 x10E6/uL (ref 4.14–5.80)
RDW: 13.7 % (ref 12.3–15.4)
WBC: 5.7 10*3/uL (ref 3.4–10.8)

## 2016-03-17 ENCOUNTER — Encounter (HOSPITAL_COMMUNITY)
Admission: RE | Disposition: A | Discharge status: Home / Self Care | Payer: Self-pay | Source: Ambulatory Visit | Attending: Cardiology

## 2016-03-17 ENCOUNTER — Ambulatory Visit (HOSPITAL_BASED_OUTPATIENT_CLINIC_OR_DEPARTMENT_OTHER): Payer: BLUE CROSS/BLUE SHIELD

## 2016-03-17 ENCOUNTER — Ambulatory Visit (HOSPITAL_COMMUNITY)
Admission: RE | Admit: 2016-03-17 | Discharge: 2016-03-17 | Disposition: A | Discharge status: Home / Self Care | Payer: BLUE CROSS/BLUE SHIELD | Source: Ambulatory Visit | Attending: Cardiology | Admitting: Cardiology

## 2016-03-17 ENCOUNTER — Encounter (HOSPITAL_COMMUNITY): Payer: Self-pay | Admitting: *Deleted

## 2016-03-17 DIAGNOSIS — G4733 Obstructive sleep apnea (adult) (pediatric): Secondary | ICD-10-CM | POA: Insufficient documentation

## 2016-03-17 DIAGNOSIS — I340 Nonrheumatic mitral (valve) insufficiency: Secondary | ICD-10-CM | POA: Insufficient documentation

## 2016-03-17 DIAGNOSIS — Z7901 Long term (current) use of anticoagulants: Secondary | ICD-10-CM | POA: Diagnosis not present

## 2016-03-17 DIAGNOSIS — I4891 Unspecified atrial fibrillation: Secondary | ICD-10-CM | POA: Diagnosis not present

## 2016-03-17 DIAGNOSIS — I481 Persistent atrial fibrillation: Secondary | ICD-10-CM | POA: Diagnosis not present

## 2016-03-17 DIAGNOSIS — Z79899 Other long term (current) drug therapy: Secondary | ICD-10-CM | POA: Diagnosis not present

## 2016-03-17 HISTORY — PX: TEE WITHOUT CARDIOVERSION: SHX5443

## 2016-03-17 SURGERY — ECHOCARDIOGRAM, TRANSESOPHAGEAL
Anesthesia: Moderate Sedation

## 2016-03-17 MED ORDER — MIDAZOLAM HCL 5 MG/ML IJ SOLN
INTRAMUSCULAR | Status: AC
  Filled 2016-03-17: qty 2

## 2016-03-17 MED ORDER — FENTANYL CITRATE (PF) 100 MCG/2ML IJ SOLN
INTRAMUSCULAR | Status: DC | PRN
  Administered 2016-03-17 (×2): 25 ug via INTRAVENOUS

## 2016-03-17 MED ORDER — FENTANYL CITRATE (PF) 100 MCG/2ML IJ SOLN
INTRAMUSCULAR | Status: AC
  Filled 2016-03-17: qty 2

## 2016-03-17 MED ORDER — SODIUM CHLORIDE 0.9 % IV SOLN: INTRAVENOUS | Status: DC

## 2016-03-17 MED ORDER — BUTAMBEN-TETRACAINE-BENZOCAINE 2-2-14 % EX AERO
INHALATION_SPRAY | CUTANEOUS | Status: DC | PRN
  Administered 2016-03-17: 2 via TOPICAL

## 2016-03-17 MED ORDER — MIDAZOLAM HCL 10 MG/2ML IJ SOLN
INTRAMUSCULAR | Status: DC | PRN
  Administered 2016-03-17: 1 mg via INTRAVENOUS
  Administered 2016-03-17 (×2): 2 mg via INTRAVENOUS

## 2016-03-17 NOTE — CV Procedure (Signed)
See full report in [employer] Pt sedated with versed mg and fentanyl micrograms IV Moderate to severe global reduction in LV systolic function (EF [id number]) Mild LAE; No LAA thrombus Mild MR and TR Francisco Reed

## 2016-03-17 NOTE — H&P (Signed)
[name]  02/25/2016 12:15 PM  Office Visit  MRN:  [id number]  Description: Male DOB: 1960/11/16 Provider: [name], MD Department: [facility] [facility]  Vitals   BP  100/74   Pulse    127   Ht  5\' 10"  (1.778 m)   Wt  223 lb 6.4 oz (101.3 kg)   BMI  32.05 kg/m    Progress Notes   [name], MD at 02/25/2016 12:15 PM   Status: Signed       Electrophysiology Office Note   Date:  02/25/2016   ID:  Francisco Reed, DOB 02-Apr-1960, MRN [id number]  PCP:  Idelle Crouch, MD       Cardiologist:  Dr Nehemiah Massed Primary Electrophysiologist: [name], MD          Chief Complaint  Patient presents with  . Advice Only     History of Present Illness: Francisco Reed is a 56 y.o. male who presents today for electrophysiology evaluation.   The patient reports initially having atrial fibrillation in 2013 after presenting with symptoms of CHF.  His EF was 40-45% at that time.  He has had 4 episodes of afib (typcally about once per year) in the setting of significant stress.  He reports symptoms of fatigue and decreased exercise tolerance during his afib.  He has failed medical therapy with multaq.  He has also taken metoprolol and diltiazem.  He is anticoagulated with eliquis.  He has required cardioversion 4 times (most recently 8/17).  He saw Dr Dwana Curd for consideration of ablation at [facility] in 2016 but opted for ongoing medical therapy. Today, he is in afib.  He is not sure howe long he has been in afib currently.  Today, he denies symptoms of orthopnea, PND, lower extremity edema, claudication, dizziness, presyncope, syncope, bleeding, or neurologic sequela. The patient is tolerating medications without difficulties and is otherwise without complaint today.        Past Medical History:  Diagnosis Date  . Hyperlipemia    pt denies  . Obstructive sleep apnea    mild  . Persistent atrial fibrillation [facility])         Past Surgical History:  Procedure  Laterality Date  . CARDIOVERSION    . ELECTROPHYSIOLOGIC STUDY N/A 12/28/2014   Procedure: CARDIOVERSION;  Surgeon: Corey Skains, MD;  Location: [facility] ORS;  Service: Cardiovascular;  Laterality: N/A;  . ELECTROPHYSIOLOGIC STUDY N/A 09/07/2015   Procedure: CARDIOVERSION;  Surgeon: Corey Skains, MD;  Location: [facility] ORS;  Service: Cardiovascular;  Laterality: N/A;  . TEE WITHOUT CARDIOVERSION N/A 09/07/2015   Procedure: TRANSESOPHAGEAL ECHOCARDIOGRAM (TEE);  Surgeon: Corey Skains, MD;  Location: [facility] ORS;  Service: Cardiovascular;  Laterality: N/A;           Current Outpatient Prescriptions  Medication Sig Dispense Refill  . magnesium oxide (MAG-OX) 400 MG tablet Take 400 mg by mouth daily.    Marland Kitchen apixaban (ELIQUIS) 5 MG TABS tablet Take 5 mg by mouth 2 (two) times daily.    Marland Kitchen ascorbic acid (VITAMIN C) 1000 MG tablet Take 1,000 mg by mouth daily.    Marland Kitchen diltiazem (DILACOR XR) 240 MG 24 hr capsule Take 240 mg by mouth daily.    Marland Kitchen dronedarone (MULTAQ) 400 MG tablet Take 400 mg by mouth 2 (two) times daily with a meal.    . metoprolol succinate (TOPROL-XL) 100 MG 24 hr tablet Take by mouth daily. Pt takes 100 mg in the morning and 50 mg at  bedtime. Take with or immediately following a meal.    . Multiple Vitamin (MULTIVITAMIN) tablet Take 1 tablet by mouth daily.     No current facility-administered medications for this visit.     Allergies:   Patient has no allergy information on record.   Social History:  The patient  reports that he has never smoked. He has never used smokeless tobacco. He reports that he does not drink alcohol or use drugs.   Family History:  The patients father had afib.  He has two brothers with afib also.   ROS:  Please see the history of present illness.   All other systems are reviewed and negative.    PHYSICAL EXAM: VS:  BP 100/74   Pulse (!) 127   Ht 5\' 10"  (1.778 m)   Wt 223 lb 6.4 oz (101.3 kg)   BMI 32.05 kg/m  ,  BMI Body mass index is 32.05 kg/m. GEN: Well nourished, well developed, in no acute distress  HEENT: normal  Neck: no JVD, carotid bruits, or masses Cardiac: iRRR; no murmurs, rubs, or gallops,no edema  Respiratory:  clear to auscultation bilaterally, normal work of breathing GI: soft, nontender, nondistended, + BS MS: no deformity or atrophy  Skin: warm and dry  Neuro:  Strength and sensation are intact Psych: euthymic mood, full affect  EKG:  EKG is ordered today. The ekg ordered today shows afib, V rate 127 bpm, nonspecific St/T changes     Wt Readings from Last 3 Encounters:  02/25/16 223 lb 6.4 oz (101.3 kg)  09/07/15 223 lb (101.2 kg)      Other studies Reviewed: Additional studies/ records that were reviewed today include: Dr Fabiola Backer notes, Dr Dwana Curd notes  Review of the above records today demonstrates: as above   ASSESSMENT AND PLAN:  1.  Persistent atrial fibrillation The patient has symptomatic persistent afib.  He has failed medical therapy with metoprolol, diltiazem, and multaq. Therapeutic strategies for afib including medicine and ablation were discussed in detail with the patient today. Risk, benefits, and alternatives to EP study and radiofrequency ablation for afib were also discussed in detail today. These risks include but are not limited to stroke, bleeding, vascular damage, tamponade, perforation, damage to the esophagus, lungs, and other structures, pulmonary vein stenosis, worsening renal function, and death. The patient understands these risk and wishes to proceed.  We will therefore proceed with catheter ablation at the next available time.  Will proceed with TEE prior to ablation.  Stop multaq  2. OSA Mild and does not require CPAP   Current medicines are reviewed at length with the patient today.   The patient does not have concerns regarding his medicines.  The following changes were made today:  none   Signed, [name], MD    02/25/2016 12:41 PM     [place] [address] [phone] (office) [phone] (fax)     For TEE; no changes. Kirk Ruths

## 2016-03-17 NOTE — Progress Notes (Signed)
  Echocardiogram Echocardiogram Transesophageal has been performed.  Francisco Reed 03/17/2016, 10:51 AM

## 2016-03-17 NOTE — Discharge Instructions (Signed)
TEE  YOU HAD AN CARDIAC PROCEDURE TODAY: Refer to the procedure report and other information in the discharge instructions given to you for any specific questions about what was found during the examination. If this information does not answer your questions, please call [facility] office at [phone] to clarify.   DIET: Your first meal following the procedure should be a light meal and then it is ok to progress to your normal diet. A half-sandwich or bowl of soup is an example of a good first meal. Heavy or fried foods are harder to digest and may make you feel nauseous or bloated. Drink plenty of fluids but you should avoid alcoholic beverages for 24 hours. If you had a esophageal dilation, please see attached instructions for diet.   ACTIVITY: Your care partner should take you home directly after the procedure. You should plan to take it easy, moving slowly for the rest of the day. You can resume normal activity the day after the procedure however YOU SHOULD NOT DRIVE, use power tools, machinery or perform tasks that involve climbing or major physical exertion for 24 hours (because of the sedation medicines used during the test).   SYMPTOMS TO REPORT IMMEDIATELY: A cardiologist can be reached at any hour. Please call [phone] for any of the following symptoms:  Vomiting of blood or coffee ground material  New, significant abdominal pain  New, significant chest pain or pain under the shoulder blades  Painful or persistently difficult swallowing  New shortness of breath  Black, tarry-looking or red, bloody stools  FOLLOW UP:  Please also call with any specific questions about appointments or follow up tests.   Moderate Conscious Sedation, Adult, Care After These instructions provide you with information about caring for yourself after your procedure. Your health care provider may also give you more specific instructions. Your treatment has been planned according to current medical  practices, but problems sometimes occur. Call your health care provider if you have any problems or questions after your procedure. What can I expect after the procedure? After your procedure, it is common:  To feel sleepy for several hours.  To feel clumsy and have poor balance for several hours.  To have poor judgment for several hours.  To vomit if you eat too soon. Follow these instructions at home: For at least 24 hours after the procedure:   Do not:  Participate in activities where you could fall or become injured.  Drive.  Use heavy machinery.  Drink alcohol.  Take sleeping pills or medicines that cause drowsiness.  Make important decisions or sign legal documents.  Take care of children on your own.  Rest. Eating and drinking  Follow the diet recommended by your health care provider.  If you vomit:  Drink water, juice, or soup when you can drink without vomiting.  Make sure you have little or no nausea before eating solid foods. General instructions  Have a responsible adult stay with you until you are awake and alert.  Take over-the-counter and prescription medicines only as told by your health care provider.  If you smoke, do not smoke without supervision.  Keep all follow-up visits as told by your health care provider. This is important. Contact a health care provider if:  You keep feeling nauseous or you keep vomiting.  You feel light-headed.  You develop a rash.  You have a fever. Get help right away if:  You have trouble breathing. This information is not intended to replace advice given  to you by your health care provider. Make sure you discuss any questions you have with your health care provider. Document Released: 11/24/2012 Document Revised: 07/09/2015 Document Reviewed: 05/26/2015 Elsevier Interactive Patient Education  2017 [employer].

## 2016-03-17 NOTE — Interval H&P Note (Signed)
History and Physical Interval Note:  03/17/2016 9:04 AM  Francisco Reed  has presented today for surgery, with the diagnosis of AFIB  The various methods of treatment have been discussed with the patient and family. After consideration of risks, benefits and other options for treatment, the patient has consented to  Procedure(s): TRANSESOPHAGEAL ECHOCARDIOGRAM (TEE) (N/A) as a surgical intervention .  The patient's history has been reviewed, patient examined, no change in status, stable for surgery.  I have reviewed the patient's chart and labs.  Questions were answered to the patient's satisfaction.     Kirk Ruths

## 2016-03-17 NOTE — Anesthesia Preprocedure Evaluation (Addendum)
Anesthesia Evaluation  Patient identified by MRN, date of birth, ID band Patient awake    Reviewed: Allergy & Precautions, NPO status , Patient's Chart, lab work & pertinent test results  Airway Mallampati: II  TM Distance: >3 FB Neck ROM: Full    Dental  (+) Dental Advisory Given   Pulmonary sleep apnea ,    breath sounds clear to auscultation       Cardiovascular +CHF  + dysrhythmias Atrial Fibrillation  Rhythm:Regular Rate:Normal  Echo: Study Conclusions  - Left ventricle: Systolic function was moderately to severely   reduced. The estimated ejection fraction was in the range of 30%   to 35%. Diffuse hypokinesis. - Aortic valve: No evidence of vegetation. There was trivial   regurgitation. - Aortic root: The aortic root was mildly dilated. - Mitral valve: No evidence of vegetation. There was mild   regurgitation. - Left atrium: The atrium was mildly dilated. No evidence of   thrombus in the atrial cavity or appendage. - Right ventricle: The cavity size was mildly dilated. - Right atrium: No evidence of thrombus in the atrial cavity or   appendage. - Atrial septum: No defect or patent foramen ovale was identified. - Tricuspid valve: No evidence of vegetation. - Pulmonic valve: No evidence of vegetation.   Neuro/Psych negative neurological ROS     GI/Hepatic negative GI ROS, Neg liver ROS,   Endo/Other  negative endocrine ROS  Renal/GU negative Renal ROS     Musculoskeletal   Abdominal   Peds  Hematology negative hematology ROS (+)   Anesthesia Other Findings   Reproductive/Obstetrics                            Lab Results  Component Value Date   WBC 5.7 03/10/2016   HGB 15.1 09/06/2013   HCT 44.0 03/10/2016   MCV 90 03/10/2016   PLT 203 03/10/2016   Lab Results  Component Value Date   CREATININE 1.06 03/10/2016   BUN 11 03/10/2016   NA 140 03/10/2016   K 5.1 03/10/2016    CL 103 03/10/2016   CO2 24 03/10/2016    Anesthesia Physical Anesthesia Plan  ASA: III  Anesthesia Plan: General   Post-op Pain Management:    Induction: Intravenous  Airway Management Planned: Oral ETT  Additional Equipment:   Intra-op Plan:   Post-operative Plan: Extubation in OR  Informed Consent: I have reviewed the patients History and Physical, chart, labs and discussed the procedure including the risks, benefits and alternatives for the proposed anesthesia with the patient or authorized representative who has indicated his/her understanding and acceptance.   Dental advisory given  Plan Discussed with: CRNA  Anesthesia Plan Comments:        Anesthesia Quick Evaluation

## 2016-03-18 ENCOUNTER — Ambulatory Visit (HOSPITAL_COMMUNITY): Payer: BLUE CROSS/BLUE SHIELD | Admitting: Certified Registered Nurse Anesthetist

## 2016-03-18 ENCOUNTER — Encounter (HOSPITAL_COMMUNITY): Payer: Self-pay | Admitting: Certified Registered Nurse Anesthetist

## 2016-03-18 ENCOUNTER — Encounter (HOSPITAL_COMMUNITY)
Admission: RE | Disposition: A | Discharge status: Home / Self Care | Payer: Self-pay | Source: Ambulatory Visit | Attending: Internal Medicine

## 2016-03-18 ENCOUNTER — Ambulatory Visit (HOSPITAL_COMMUNITY)
Admission: RE | Admit: 2016-03-18 | Discharge: 2016-03-19 | Disposition: A | Discharge status: Home / Self Care | Payer: BLUE CROSS/BLUE SHIELD | Source: Ambulatory Visit | Attending: Internal Medicine | Admitting: Internal Medicine

## 2016-03-18 DIAGNOSIS — I5022 Chronic systolic (congestive) heart failure: Secondary | ICD-10-CM | POA: Diagnosis not present

## 2016-03-18 DIAGNOSIS — Z7901 Long term (current) use of anticoagulants: Secondary | ICD-10-CM | POA: Diagnosis not present

## 2016-03-18 DIAGNOSIS — I4819 Other persistent atrial fibrillation: Secondary | ICD-10-CM

## 2016-03-18 DIAGNOSIS — G4733 Obstructive sleep apnea (adult) (pediatric): Secondary | ICD-10-CM | POA: Diagnosis not present

## 2016-03-18 DIAGNOSIS — E785 Hyperlipidemia, unspecified: Secondary | ICD-10-CM | POA: Insufficient documentation

## 2016-03-18 DIAGNOSIS — I4891 Unspecified atrial fibrillation: Secondary | ICD-10-CM | POA: Diagnosis present

## 2016-03-18 DIAGNOSIS — I428 Other cardiomyopathies: Secondary | ICD-10-CM | POA: Diagnosis not present

## 2016-03-18 DIAGNOSIS — I481 Persistent atrial fibrillation: Secondary | ICD-10-CM | POA: Diagnosis not present

## 2016-03-18 HISTORY — PX: ELECTROPHYSIOLOGIC STUDY: SHX172A

## 2016-03-18 LAB — POCT ACTIVATED CLOTTING TIME
ACTIVATED CLOTTING TIME: 290 s
ACTIVATED CLOTTING TIME: 296 s
ACTIVATED CLOTTING TIME: 191 s
ACTIVATED CLOTTING TIME: 191 s
Activated Clotting Time: 329 seconds

## 2016-03-18 SURGERY — ATRIAL FIBRILLATION ABLATION
Anesthesia: General

## 2016-03-18 MED ORDER — PROMETHAZINE HCL 25 MG/ML IJ SOLN: 6.2500 mg | INTRAMUSCULAR | Status: DC | PRN

## 2016-03-18 MED ORDER — HEPARIN SODIUM (PORCINE) 1000 UNIT/ML IJ SOLN
INTRAMUSCULAR | Status: DC | PRN
  Administered 2016-03-18: 1000 [IU] via INTRAVENOUS

## 2016-03-18 MED ORDER — HYDROCODONE-ACETAMINOPHEN 5-325 MG PO TABS: 1.0000 | ORAL_TABLET | ORAL | Status: DC | PRN

## 2016-03-18 MED ORDER — HEPARIN SODIUM (PORCINE) 1000 UNIT/ML IJ SOLN
INTRAMUSCULAR | Status: DC | PRN
  Administered 2016-03-18: 4000 [IU] via INTRAVENOUS
  Administered 2016-03-18: 3000 [IU] via INTRAVENOUS

## 2016-03-18 MED ORDER — SODIUM CHLORIDE 0.9 % IV SOLN
INTRAVENOUS | Status: DC
  Administered 2016-03-18 (×2): via INTRAVENOUS

## 2016-03-18 MED ORDER — BUPIVACAINE HCL (PF) 0.25 % IJ SOLN
INTRAMUSCULAR | Status: DC | PRN
  Administered 2016-03-18: 30 mL

## 2016-03-18 MED ORDER — BUPIVACAINE HCL (PF) 0.25 % IJ SOLN
INTRAMUSCULAR | Status: AC
Start: 2016-03-18 — End: 2016-03-18
  Filled 2016-03-18: qty 30

## 2016-03-18 MED ORDER — IOPAMIDOL (ISOVUE-370) INJECTION 76%
INTRAVENOUS | Status: DC | PRN
  Administered 2016-03-18: 100 mL via INTRAVENOUS
  Administered 2016-03-18 (×2): 4 mL via INTRAVENOUS

## 2016-03-18 MED ORDER — FENTANYL CITRATE (PF) 100 MCG/2ML IJ SOLN: 25.0000 ug | INTRAMUSCULAR | Status: DC | PRN

## 2016-03-18 MED ORDER — PROTAMINE SULFATE 10 MG/ML IV SOLN
INTRAVENOUS | Status: DC | PRN
  Administered 2016-03-18: 30 mg via INTRAVENOUS

## 2016-03-18 MED ORDER — PHENYLEPHRINE HCL 10 MG/ML IJ SOLN
INTRAMUSCULAR | Status: DC | PRN
  Administered 2016-03-18 (×6): 80 ug via INTRAVENOUS

## 2016-03-18 MED ORDER — ROCURONIUM BROMIDE 100 MG/10ML IV SOLN
INTRAVENOUS | Status: DC | PRN
  Administered 2016-03-18: 40 mg via INTRAVENOUS
  Administered 2016-03-18: 10 mg via INTRAVENOUS

## 2016-03-18 MED ORDER — LIDOCAINE HCL (CARDIAC) 20 MG/ML IV SOLN
INTRAVENOUS | Status: DC | PRN
  Administered 2016-03-18: 60 mg via INTRAVENOUS

## 2016-03-18 MED ORDER — SODIUM CHLORIDE 0.9% FLUSH: 3.0000 mL | Freq: Two times a day (BID) | INTRAVENOUS | Status: DC

## 2016-03-18 MED ORDER — SODIUM CHLORIDE 0.9 % IV SOLN: 250.0000 mL | INTRAVENOUS | Status: DC | PRN

## 2016-03-18 MED ORDER — SODIUM CHLORIDE 0.9% FLUSH: 3.0000 mL | INTRAVENOUS | Status: DC | PRN

## 2016-03-18 MED ORDER — MIDAZOLAM HCL 5 MG/5ML IJ SOLN
INTRAMUSCULAR | Status: DC | PRN
Start: 2016-03-18 — End: 2016-03-18
  Administered 2016-03-18: 2 mg via INTRAVENOUS

## 2016-03-18 MED ORDER — ONDANSETRON HCL 4 MG/2ML IJ SOLN
INTRAMUSCULAR | Status: DC | PRN
  Administered 2016-03-18: 4 mg via INTRAVENOUS

## 2016-03-18 MED ORDER — ONDANSETRON HCL 4 MG/2ML IJ SOLN: 4.0000 mg | Freq: Four times a day (QID) | INTRAMUSCULAR | Status: DC | PRN

## 2016-03-18 MED ORDER — ACETAMINOPHEN 325 MG PO TABS
650.0000 mg | ORAL_TABLET | ORAL | Status: DC | PRN
  Administered 2016-03-18: 650 mg via ORAL
  Filled 2016-03-18: qty 2

## 2016-03-18 MED ORDER — SUGAMMADEX SODIUM 200 MG/2ML IV SOLN
INTRAVENOUS | Status: DC | PRN
  Administered 2016-03-18: 200 mg via INTRAVENOUS

## 2016-03-18 MED ORDER — METOPROLOL SUCCINATE ER 50 MG PO TB24: 50.0000 mg | ORAL_TABLET | Freq: Every day | ORAL | Status: DC

## 2016-03-18 MED ORDER — APIXABAN 5 MG PO TABS
5.0000 mg | ORAL_TABLET | Freq: Two times a day (BID) | ORAL | Status: DC
  Administered 2016-03-18 – 2016-03-19 (×2): 5 mg via ORAL
  Filled 2016-03-18 (×2): qty 1

## 2016-03-18 MED ORDER — PROPOFOL 10 MG/ML IV BOLUS
INTRAVENOUS | Status: DC | PRN
Start: 2016-03-18 — End: 2016-03-18
  Administered 2016-03-18: 50 mg via INTRAVENOUS
  Administered 2016-03-18: 150 mg via INTRAVENOUS

## 2016-03-18 MED ORDER — PHENYLEPHRINE HCL 10 MG/ML IJ SOLN
INTRAVENOUS | Status: DC | PRN
  Administered 2016-03-18: 25 ug/min via INTRAVENOUS

## 2016-03-18 MED ORDER — SUCCINYLCHOLINE CHLORIDE 20 MG/ML IJ SOLN
INTRAMUSCULAR | Status: DC | PRN
  Administered 2016-03-18: 120 mg via INTRAVENOUS

## 2016-03-18 MED ORDER — METOPROLOL SUCCINATE ER 50 MG PO TB24
50.0000 mg | ORAL_TABLET | Freq: Every day | ORAL | Status: DC
  Administered 2016-03-18: 50 mg via ORAL
  Filled 2016-03-18: qty 1

## 2016-03-18 MED ORDER — HEPARIN SODIUM (PORCINE) 1000 UNIT/ML IJ SOLN
INTRAMUSCULAR | Status: AC
Start: 2016-03-18 — End: 2016-03-18
  Filled 2016-03-18: qty 1

## 2016-03-18 MED ORDER — HEPARIN SODIUM (PORCINE) 1000 UNIT/ML IJ SOLN
INTRAMUSCULAR | Status: DC | PRN
  Administered 2016-03-18: 12000 [IU] via INTRAVENOUS

## 2016-03-18 MED ORDER — FENTANYL CITRATE (PF) 100 MCG/2ML IJ SOLN
INTRAMUSCULAR | Status: DC | PRN
  Administered 2016-03-18 (×2): 50 ug via INTRAVENOUS

## 2016-03-18 SURGICAL SUPPLY — 21 items
BAG SNAP BAND KOVER 36X36 (MISCELLANEOUS) ×3 IMPLANT
BLANKET WARM UNDERBOD FULL ACC (MISCELLANEOUS) ×3 IMPLANT
CATH DIAG 6FR PIGTAIL (CATHETERS) ×3 IMPLANT
CATH NAVISTAR SMARTTOUCH DF (ABLATOR) ×3 IMPLANT
CATH SOUNDSTAR 3D IMAGING (CATHETERS) ×3 IMPLANT
CATH VARIABLE LASSO NAV 2515 (CATHETERS) ×3 IMPLANT
CATH WEBSTER BI DIR CS D-F CRV (CATHETERS) ×3 IMPLANT
COVER SWIFTLINK CONNECTOR (BAG) ×3 IMPLANT
NEEDLE TRANSEP BRK 71CM 407200 (NEEDLE) ×3 IMPLANT
PACK EP LATEX FREE (CUSTOM PROCEDURE TRAY) ×2
PACK EP LF (CUSTOM PROCEDURE TRAY) ×1 IMPLANT
PAD DEFIB LIFELINK (PAD) ×3 IMPLANT
PATCH CARTO3 (PAD) ×3 IMPLANT
SHEATH AVANTI 11F 11CM (SHEATH) ×3 IMPLANT
SHEATH PINNACLE 7F 10CM (SHEATH) ×6 IMPLANT
SHEATH PINNACLE 9F 10CM (SHEATH) ×3 IMPLANT
SHEATH SWARTZ TS SL2 63CM 8.5F (SHEATH) ×3 IMPLANT
SHIELD RADPAD SCOOP 12X17 (MISCELLANEOUS) ×3 IMPLANT
SYR MEDRAD MARK V 150ML (SYRINGE) ×3 IMPLANT
TUBING CONTRAST HIGH PRESS 48 (TUBING) ×3 IMPLANT
TUBING SMART ABLATE COOLFLOW (TUBING) ×3 IMPLANT

## 2016-03-18 NOTE — Interval H&P Note (Signed)
History and Physical Interval Note:  03/18/2016 7:22 AM  Francisco Reed  has presented today for surgery, with the diagnosis of a-fib  The various methods of treatment have been discussed with the patient and family. After consideration of risks, benefits and other options for treatment, the patient has consented to  Procedure(s): Atrial Fibrillation Ablation (N/A) as a surgical intervention .  The patient's history has been reviewed, patient examined, no change in status, stable for surgery.  I have reviewed the patient's chart and labs.  Questions were answered to the patient's satisfaction.    TEE reviewed with patient  [name]

## 2016-03-18 NOTE — Transfer of Care (Signed)
Immediate Anesthesia Transfer of Care Note  Patient: Francisco Reed  Procedure(s) Performed: Procedure(s): Atrial Fibrillation Ablation (N/A)  Patient Location: PACU  Anesthesia Type:General  Level of Consciousness: awake, alert  and oriented  Airway & Oxygen Therapy: Patient Spontanous Breathing and Patient connected to face mask oxygen  Post-op Assessment: Report given to RN, Post -op Vital signs reviewed and stable and Patient moving all extremities X 4  Post vital signs: Reviewed and stable  Last Vitals:  Vitals:   03/18/16 1035 03/18/16 1040  BP: 108/81 104/81  Pulse: 70 69  Resp: (!) 9 10  Temp:      Last Pain:  Vitals:   03/18/16 0542  TempSrc: Oral         Complications: No apparent anesthesia complications

## 2016-03-18 NOTE — Anesthesia Procedure Notes (Signed)
Procedure Name: Intubation Date/Time: 03/18/2016 7:47 AM Performed by: Rejeana Brock L Pre-anesthesia Checklist: Patient identified, Emergency Drugs available, Suction available and Patient being monitored Patient Re-evaluated:Patient Re-evaluated prior to inductionOxygen Delivery Method: Circle System Utilized Preoxygenation: Pre-oxygenation with 100% oxygen Intubation Type: IV induction Ventilation: Mask ventilation without difficulty Laryngoscope Size: Mac and 4 Grade View: Grade I Tube type: Oral Tube size: 7.5 mm Number of attempts: 1 Airway Equipment and Method: Stylet and Oral airway Placement Confirmation: ETT inserted through vocal cords under direct vision,  positive ETCO2 and breath sounds checked- equal and bilateral Secured at: 22 cm Tube secured with: Tape Dental Injury: Teeth and Oropharynx as per pre-operative assessment

## 2016-03-18 NOTE — Anesthesia Postprocedure Evaluation (Signed)
Anesthesia Post Note  Patient: Francisco Reed  Procedure(s) Performed: Procedure(s) (LRB): Atrial Fibrillation Ablation (N/A)  Patient location during evaluation: PACU Anesthesia Type: General Level of consciousness: awake and alert Pain management: pain level controlled Vital Signs Assessment: post-procedure vital signs reviewed and stable Respiratory status: spontaneous breathing, nonlabored ventilation, respiratory function stable and patient connected to nasal cannula oxygen Cardiovascular status: blood pressure returned to baseline and stable Postop Assessment: no signs of nausea or vomiting Anesthetic complications: no       Last Vitals:  Vitals:   03/18/16 1222 03/18/16 1237  BP: 109/89 110/88  Pulse: 67 69  Resp: 15 17  Temp:      Last Pain:  Vitals:   03/18/16 1141  TempSrc: Temporal                 Tiajuana Amass

## 2016-03-18 NOTE — Progress Notes (Signed)
Site area: rt groin- 3 fv sheaths Site Prior to Removal:  Level 0 Pressure Applied For:  20 minutes Manual:   yes Patient Status During Pull:  stable Post Pull Site:  Level  0 Post Pull Instructions Given:  yes Post Pull Pulses Present: palpable Dressing Applied:  yes Bedrest begins @ 1135 Comments: IV saline locked

## 2016-03-18 NOTE — H&P (View-Only) (Signed)
Electrophysiology Office Note   Date:  02/25/2016   ID:  Francisco Reed, DOB [birth year], MRN [id number]  PCP:  Idelle Crouch, MD  Cardiologist:  Dr Nehemiah Massed Primary Electrophysiologist: [name], MD    Chief Complaint  Patient presents with  . Advice Only     History of Present Illness: Francisco Reed is a 56 y.o. male who presents today for electrophysiology evaluation.   The patient reports initially having atrial fibrillation in 2013 after presenting with symptoms of CHF.  His EF was 40-45% at that time.  He has had 4 episodes of afib (typcally about once per year) in the setting of significant stress.  He reports symptoms of fatigue and decreased exercise tolerance during his afib.  He has failed medical therapy with multaq.  He has also taken metoprolol and diltiazem.  He is anticoagulated with eliquis.  He has required cardioversion 4 times (most recently 8/17).  He saw Dr Dwana Curd for consideration of ablation at [facility] in 2016 but opted for ongoing medical therapy. Today, he is in afib.  He is not sure howe long he has been in afib currently.  Today, he denies symptoms of orthopnea, PND, lower extremity edema, claudication, dizziness, presyncope, syncope, bleeding, or neurologic sequela. The patient is tolerating medications without difficulties and is otherwise without complaint today.    Past Medical History:  Diagnosis Date  . Hyperlipemia    pt denies  . Obstructive sleep apnea    mild  . Persistent atrial fibrillation [facility])    Past Surgical History:  Procedure Laterality Date  . CARDIOVERSION    . ELECTROPHYSIOLOGIC STUDY N/A 12/28/2014   Procedure: CARDIOVERSION;  Surgeon: Corey Skains, MD;  Location: [facility] ORS;  Service: Cardiovascular;  Laterality: N/A;  . ELECTROPHYSIOLOGIC STUDY N/A 09/07/2015   Procedure: CARDIOVERSION;  Surgeon: Corey Skains, MD;  Location: [facility] ORS;  Service: Cardiovascular;  Laterality: N/A;  . TEE WITHOUT CARDIOVERSION N/A  09/07/2015   Procedure: TRANSESOPHAGEAL ECHOCARDIOGRAM (TEE);  Surgeon: Corey Skains, MD;  Location: [facility] ORS;  Service: Cardiovascular;  Laterality: N/A;     Current Outpatient Prescriptions  Medication Sig Dispense Refill  . magnesium oxide (MAG-OX) 400 MG tablet Take 400 mg by mouth daily.    Marland Kitchen apixaban (ELIQUIS) 5 MG TABS tablet Take 5 mg by mouth 2 (two) times daily.    Marland Kitchen ascorbic acid (VITAMIN C) 1000 MG tablet Take 1,000 mg by mouth daily.    Marland Kitchen diltiazem (DILACOR XR) 240 MG 24 hr capsule Take 240 mg by mouth daily.    Marland Kitchen dronedarone (MULTAQ) 400 MG tablet Take 400 mg by mouth 2 (two) times daily with a meal.    . metoprolol succinate (TOPROL-XL) 100 MG 24 hr tablet Take by mouth daily. Pt takes 100 mg in the morning and 50 mg at bedtime. Take with or immediately following a meal.    . Multiple Vitamin (MULTIVITAMIN) tablet Take 1 tablet by mouth daily.     No current facility-administered medications for this visit.     Allergies:   Patient has no allergy information on record.   Social History:  The patient  reports that he has never smoked. He has never used smokeless tobacco. He reports that he does not drink alcohol or use drugs.   Family History:  The patients father had afib.  He has two brothers with afib also.   ROS:  Please see the history of present illness.   All other systems are reviewed  and negative.    PHYSICAL EXAM: VS:  BP 100/74   Pulse (!) 127   Ht 5\' 10"  (1.778 m)   Wt 223 lb 6.4 oz (101.3 kg)   BMI 32.05 kg/m  , BMI Body mass index is 32.05 kg/m. GEN: Well nourished, well developed, in no acute distress  HEENT: normal  Neck: no JVD, carotid bruits, or masses Cardiac: iRRR; no murmurs, rubs, or gallops,no edema  Respiratory:  clear to auscultation bilaterally, normal work of breathing GI: soft, nontender, nondistended, + BS MS: no deformity or atrophy  Skin: warm and dry  Neuro:  Strength and sensation are intact Psych: euthymic mood, full  affect  EKG:  EKG is ordered today. The ekg ordered today shows afib, V rate 127 bpm, nonspecific St/T changes  Wt Readings from Last 3 Encounters:  02/25/16 223 lb 6.4 oz (101.3 kg)  09/07/15 223 lb (101.2 kg)      Other studies Reviewed: Additional studies/ records that were reviewed today include: Dr Fabiola Backer notes, Dr Dwana Curd notes  Review of the above records today demonstrates: as above   ASSESSMENT AND PLAN:  1.  Persistent atrial fibrillation The patient has symptomatic persistent afib.  He has failed medical therapy with metoprolol, diltiazem, and multaq. Therapeutic strategies for afib including medicine and ablation were discussed in detail with the patient today. Risk, benefits, and alternatives to EP study and radiofrequency ablation for afib were also discussed in detail today. These risks include but are not limited to stroke, bleeding, vascular damage, tamponade, perforation, damage to the esophagus, lungs, and other structures, pulmonary vein stenosis, worsening renal function, and death. The patient understands these risk and wishes to proceed.  We will therefore proceed with catheter ablation at the next available time.  Will proceed with TEE prior to ablation.  Stop multaq  2. OSA Mild and does not require CPAP   Current medicines are reviewed at length with the patient today.   The patient does not have concerns regarding his medicines.  The following changes were made today:  none   Signed, [name], MD  02/25/2016 12:41 PM     [place] [address] [phone] (office) [phone] (fax)

## 2016-03-19 DIAGNOSIS — I5022 Chronic systolic (congestive) heart failure: Secondary | ICD-10-CM | POA: Diagnosis not present

## 2016-03-19 DIAGNOSIS — G4733 Obstructive sleep apnea (adult) (pediatric): Secondary | ICD-10-CM | POA: Diagnosis not present

## 2016-03-19 DIAGNOSIS — I481 Persistent atrial fibrillation: Secondary | ICD-10-CM

## 2016-03-19 DIAGNOSIS — I428 Other cardiomyopathies: Secondary | ICD-10-CM | POA: Diagnosis not present

## 2016-03-19 LAB — BASIC METABOLIC PANEL
Anion gap: 5 (ref 5–15)
BUN: 10 mg/dL (ref 6–20)
CALCIUM: 8.6 mg/dL — AB (ref 8.9–10.3)
CO2: 24 mmol/L (ref 22–32)
CREATININE: 0.98 mg/dL (ref 0.61–1.24)
Chloride: 106 mmol/L (ref 101–111)
GFR calc non Af Amer: >60 mL/min (ref >60)
Glucose, Bld: 92 mg/dL (ref 65–99)
Potassium: 4 mmol/L (ref 3.5–5.1)
SODIUM: 135 mmol/L (ref 135–145)

## 2016-03-19 MED ORDER — PANTOPRAZOLE SODIUM 40 MG PO TBEC: 40.0000 mg | DELAYED_RELEASE_TABLET | Freq: Every day | ORAL | 0 refills | Status: DC

## 2016-03-19 NOTE — Discharge Summary (Signed)
ELECTROPHYSIOLOGY PROCEDURE DISCHARGE SUMMARY    Patient ID: Francisco Reed,  MRN: [id number], DOB/AGE: [birth year] 56 y.o.  Admit date: 03/18/2016 Discharge date: 03/19/2016  Primary Care Physician: Idelle Crouch, MD Primary Cardiologist: Nehemiah Massed Electrophysiologist: [name], MD  Primary Discharge Diagnosis:  Persistent atrial fibrillation status post ablation this admission  Secondary Discharge Diagnosis:  1.  OSA 2.  Non ischemic cardiomyopathy 3.  Chronic systolic heart failure  Procedures This Admission:  1.  Electrophysiology study and radiofrequency catheter ablation on 03/18/16 by Dr [name].  This study demonstrated atrial fibrillation upon presentation; rotational Angiography reveals a moderate sized left atrium with four separate pulmonary veins without evidence of pulmonary vein stenosis; successful electrical isolation and anatomical encircling of all four pulmonary veins with radiofrequency current.  A WACA approach was used; additional left atrial ablation was performed with a standard box lesion created along the posterior wall of the left atrium; atrial fibrillation successfully cardioverted to sinus rhythm; no early apparent complications. .   Brief HPI: Francisco Reed is a 56 y.o. male with a history of persistent atrial fibrillation.  They have failed medical therapy with metoprolol, diltiazem, and multaq. Risks, benefits, and alternatives to catheter ablation of atrial fibrillation were reviewed with the patient who wished to proceed.  The patient underwent TEE prior to the procedure which demonstrated depressed LV function and no LAA thrombus.    Hospital Course:  The patient was admitted and underwent EPS/RFCA of atrial fibrillation with details as outlined above.  They were monitored on telemetry overnight which demonstrated sinus rhythm.  Groin was without complication on the day of discharge.  The patient was examined and considered to be  stable for discharge.  Wound care and restrictions were reviewed with the patient.  The patient will be seen back by Roderic Palau, NP in 4 weeks and Dr Rayann Heman in 12 weeks for post ablation follow up.   This patients CHA2DS2-VASc Score and unadjusted Ischemic Stroke Rate (% per year) is equal to 0.6 % stroke rate/year from a score of 1 Above score calculated as 1 point each if present [CHF, HTN, DM, Vascular=MI/PAD/Aortic Plaque, Age if 65-74, or Male] Above score calculated as 2 points each if present [Age > 75, or Stroke/TIA/TE]   Physical Exam: Vitals:   03/19/16 0043 03/19/16 0045 03/19/16 0453 03/19/16 0813  BP:  (!) 121/92 121/83 124/81  Pulse:   66 69  Resp:  16 15 18   Temp: 98.4 F (36.9 C)  97.9 F (36.6 C) 98 F (36.7 C)  TempSrc: Oral  Oral Oral  SpO2:   97% 99%  Weight:   217 lb (98.4 kg)   Height:        GEN- The patient is well appearing, alert and oriented x 3 today.   HEENT: normocephalic, atraumatic; sclera clear, conjunctiva pink; hearing intact; oropharynx clear; neck supple  Lungs- Clear to ausculation bilaterally, normal work of breathing.  No wheezes, rales, rhonchi Heart- Regular rate and rhythm, no murmurs, rubs or gallops  GI- soft, non-tender, non-distended, bowel sounds present  Extremities- no clubbing, cyanosis, or edema; DP/PT/radial pulses 2+ bilaterally, groin without hematoma/bruit MS- no significant deformity or atrophy Skin- warm and dry, no rash or lesion Psych- euthymic mood, full affect Neuro- strength and sensation are intact   Labs:   Lab Results  Component Value Date   WBC 5.7 03/10/2016   HGB 15.1 09/06/2013   HCT 44.0 03/10/2016   MCV 90 03/10/2016   PLT  203 03/10/2016   No results for input(s): NA, K, CL, CO2, BUN, CREATININE, CALCIUM, PROT, BILITOT, ALKPHOS, ALT, AST, GLUCOSE in the last 168 hours.  Invalid input(s): LABALBU   Discharge Medications:  Allergies as of 03/19/2016      Reactions   No Known Allergies         Medication List    STOP taking these medications   diltiazem 120 MG 24 hr capsule Commonly known as:  CARDIZEM CD   dronedarone 400 MG tablet Commonly known as:  MULTAQ     TAKE these medications   ascorbic acid 1000 MG tablet Commonly known as:  VITAMIN C Take 1,000 mg by mouth daily.   ELIQUIS 5 MG Tabs tablet Generic drug:  apixaban Take 5 mg by mouth 2 (two) times daily.   magnesium oxide 400 MG tablet Commonly known as:  MAG-OX Take 400 mg by mouth daily.   metoprolol succinate 100 MG 24 hr tablet Commonly known as:  TOPROL-XL Take 100 mg by mouth every morning. What changed:  Another medication with the same name was removed. Continue taking this medication, and follow the directions you see here.   pantoprazole 40 MG tablet Commonly known as:  PROTONIX Take 1 tablet (40 mg total) by mouth daily. Take for 6 weeks then discontinue   VICKS SINEX DAYQUIL/NYQUIL PO Take 2 tablets by mouth daily as needed (cold symptoms).       Disposition:  Discharge Instructions    Diet - low sodium heart healthy    Complete by:  As directed    Increase activity slowly    Complete by:  As directed      Follow-up Information    [place] Follow up on 04/16/2016.   Specialty:  Cardiology Why:  at 9:30AM  Contact information: [address] [id number] [place] [id number] [place], [place] Follow up on 06/18/2016.   Specialty:  Cardiology Why:  at 9:45AM Contact information: [place] [address] [phone]           Duration of Discharge Encounter: Greater than 30 minutes including physician time.  Signed, Chanetta Marshall, NP 03/19/2016 8:32 AM  I have seen, examined the patient, and reviewed the above assessment and plan. On exam, RRR.  Changes to above are made where necessary.  Doing well s/p ablation.  Routine post procedure care.  Co Sign: [name],  MD 03/19/2016 12:21 PM

## 2016-04-16 ENCOUNTER — Encounter (HOSPITAL_COMMUNITY): Payer: BLUE CROSS/BLUE SHIELD | Admitting: Nurse Practitioner

## 2016-04-18 ENCOUNTER — Ambulatory Visit (HOSPITAL_COMMUNITY)
Admission: RE | Admit: 2016-04-18 | Discharge: 2016-04-18 | Disposition: A | Discharge status: Home / Self Care | Payer: BLUE CROSS/BLUE SHIELD | Source: Ambulatory Visit | Attending: Nurse Practitioner | Admitting: Nurse Practitioner

## 2016-04-18 ENCOUNTER — Encounter (HOSPITAL_COMMUNITY): Payer: Self-pay | Admitting: Nurse Practitioner

## 2016-04-18 VITALS — BP 112/74 | HR 57 | Ht 70.0 in | Wt 221.0 lb

## 2016-04-18 DIAGNOSIS — Z7901 Long term (current) use of anticoagulants: Secondary | ICD-10-CM | POA: Insufficient documentation

## 2016-04-18 DIAGNOSIS — Z9889 Other specified postprocedural states: Secondary | ICD-10-CM | POA: Diagnosis not present

## 2016-04-18 DIAGNOSIS — G4733 Obstructive sleep apnea (adult) (pediatric): Secondary | ICD-10-CM | POA: Insufficient documentation

## 2016-04-18 DIAGNOSIS — I481 Persistent atrial fibrillation: Secondary | ICD-10-CM | POA: Insufficient documentation

## 2016-04-18 DIAGNOSIS — R001 Bradycardia, unspecified: Secondary | ICD-10-CM | POA: Diagnosis not present

## 2016-04-18 DIAGNOSIS — I480 Paroxysmal atrial fibrillation: Secondary | ICD-10-CM | POA: Diagnosis not present

## 2016-04-18 DIAGNOSIS — E785 Hyperlipidemia, unspecified: Secondary | ICD-10-CM | POA: Diagnosis not present

## 2016-04-18 DIAGNOSIS — I4891 Unspecified atrial fibrillation: Secondary | ICD-10-CM | POA: Diagnosis not present

## 2016-04-18 NOTE — Progress Notes (Signed)
Primary Care Physician: Idelle Crouch, MD Referring Physician: Dr. Rosebud Poles is a 56 y.o. male with a h/o afib s/p ablation one month ago. He reports that he has not had any afib. He is well pleased with the results. No swallowing difficulties and he wishes to stop protonix. No groin issues. 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  Today, he denies symptoms of palpitations, chest pain, shortness of breath, orthopnea, PND, lower extremity edema, dizziness, presyncope, syncope, or neurologic sequela. The patient is tolerating medications without difficulties and is otherwise without complaint today.   Past Medical History:  Diagnosis Date  . Hyperlipemia    pt denies  . Obstructive sleep apnea    mild  . Persistent atrial fibrillation [facility])    Past Surgical History:  Procedure Laterality Date  . CARDIOVERSION    . ELECTROPHYSIOLOGIC STUDY N/A 12/28/2014   Procedure: CARDIOVERSION;  Surgeon: Corey Skains, MD;  Location: [facility] ORS;  Service: Cardiovascular;  Laterality: N/A;  . ELECTROPHYSIOLOGIC STUDY N/A 09/07/2015   Procedure: CARDIOVERSION;  Surgeon: Corey Skains, MD;  Location: [facility] ORS;  Service: Cardiovascular;  Laterality: N/A;  . ELECTROPHYSIOLOGIC STUDY N/A 03/18/2016   Procedure: Atrial Fibrillation Ablation;  Surgeon: [name], MD;  Location: [place] CV LAB;  Service: Cardiovascular;  Laterality: N/A;  . TEE WITHOUT CARDIOVERSION N/A 09/07/2015   Procedure: TRANSESOPHAGEAL ECHOCARDIOGRAM (TEE);  Surgeon: Corey Skains, MD;  Location: [facility] ORS;  Service: Cardiovascular;  Laterality: N/A;  . TEE WITHOUT CARDIOVERSION N/A 03/17/2016   Procedure: TRANSESOPHAGEAL ECHOCARDIOGRAM (TEE);  Surgeon: Lelon Perla, MD;  Location: [facility] ENDOSCOPY;  Service: Cardiovascular;  Laterality: N/A;    Current Outpatient Prescriptions  Medication Sig Dispense Refill  . apixaban (ELIQUIS) 5 MG TABS tablet Take 5 mg by mouth 2 (two)  times daily.    Marland Kitchen ascorbic acid (VITAMIN C) 1000 MG tablet Take 1,000 mg by mouth daily.    . magnesium oxide (MAG-OX) 400 MG tablet Take 400 mg by mouth daily.    . metoprolol succinate (TOPROL-XL) 100 MG 24 hr tablet Take 100 mg by mouth every morning.     . pantoprazole (PROTONIX) 40 MG tablet Take 1 tablet (40 mg total) by mouth daily. Take for 6 weeks then discontinue 45 tablet 0   No current facility-administered medications for this encounter.     Allergies  Allergen Reactions  . No Known Allergies     Social History   Social History  . Marital status: Married    Spouse name: N/A  . Number of children: N/A  . Years of education: N/A   Occupational History  . Not on file.   Social History Main Topics  . Smoking status: Never Smoker  . Smokeless tobacco: Never Used  . Alcohol use No  . Drug use: No  . Sexual activity: Not on file   Other Topics Concern  . Not on file   Social History Narrative   Pt lives in [place] with spouse.  Works as a Chief Strategy Officer.    No family history on file.  ROS- All systems are reviewed and negative except as per the HPI above  Physical Exam: Vitals:   04/18/16 1105  BP: 112/74  Pulse: (!) 57  Weight: 221 lb (100.2 kg)  Height: 5\' 10"  (1.778 m)   Wt Readings from Last 3 Encounters:  04/18/16 221 lb (100.2 kg)  03/19/16 217 lb (98.4 kg)  02/25/16 223  lb 6.4 oz (101.3 kg)    Labs: Lab Results  Component Value Date   NA 135 03/19/2016   K 4.0 03/19/2016   CL 106 03/19/2016   CO2 24 03/19/2016   GLUCOSE 92 03/19/2016   BUN 10 03/19/2016   CREATININE 0.98 03/19/2016   CALCIUM 8.6 (L) 03/19/2016   MG 1.8 09/06/2013   Lab Results  Component Value Date   INR 1.2 09/06/2013   No results found for: CHOL, HDL, LDLCALC, TRIG   GEN- The patient is well appearing, alert and oriented x 3 today.   Head- normocephalic, atraumatic Eyes-  Sclera clear, conjunctiva pink Ears- hearing intact Oropharynx- clear Neck- supple,  no JVP Lymph- no cervical lymphadenopathy Lungs- Clear to ausculation bilaterally, normal work of breathing Heart- Regular rate and rhythm, no murmurs, rubs or gallops, PMI not laterally displaced GI- soft, NT, ND, + BS Extremities- no clubbing, cyanosis, or edema MS- no significant deformity or atrophy Skin- no rash or lesion Psych- euthymic mood, full affect Neuro- strength and sensation are intact  EKG-Sinus brady at 57 bpm, pr int 138 ms, qrs int 96 ms, qtc 418 ms Epic records reviewed    Assessment and Plan: 1. Afib S/p ablation and is staying in [place] to stop protonix but if any indigestion symptoms occur, restart and finish RX Continue on Eliquis 5 mg bid  Continue metoprolol   F/u with Dr. Rayann Heman 5/2   Geroge Baseman. [place], [facility] [address] [place], [place] [address] [phone]

## 2016-04-24 NOTE — Addendum Note (Signed)
Encounter addended by: Sherran Needs, NP on: 04/24/2016  4:55 PM<BR>    Actions taken: LOS modified

## 2016-05-18 ENCOUNTER — Other Ambulatory Visit: Payer: Self-pay | Admitting: Internal Medicine

## 2016-05-19 NOTE — Telephone Encounter (Signed)
Pt last saw Dr Rayann Heman 03/06/16, last labs 03/19/16 Creat 0.98, Age 56, weight 100.2kg, based on specified criteria pt is on appropriate dosage of Eliquis 5mg  BID.  Will refill rx.

## 2016-05-30 ENCOUNTER — Encounter: Payer: Self-pay | Admitting: Internal Medicine

## 2016-06-18 ENCOUNTER — Encounter: Payer: BLUE CROSS/BLUE SHIELD | Admitting: Internal Medicine

## 2016-07-16 ENCOUNTER — Encounter: Payer: Self-pay | Admitting: Internal Medicine

## 2016-07-16 ENCOUNTER — Ambulatory Visit (INDEPENDENT_AMBULATORY_CARE_PROVIDER_SITE_OTHER): Payer: BLUE CROSS/BLUE SHIELD | Admitting: Internal Medicine

## 2016-07-16 VITALS — BP 110/78 | HR 57 | Ht 70.0 in | Wt 219.8 lb

## 2016-07-16 DIAGNOSIS — I4819 Other persistent atrial fibrillation: Secondary | ICD-10-CM

## 2016-07-16 DIAGNOSIS — I481 Persistent atrial fibrillation: Secondary | ICD-10-CM

## 2016-07-16 NOTE — Progress Notes (Signed)
   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 male who presents today for routine electrophysiology followup.  Since his recent afib ablation, the patient reports doing very well.  Denies procedure related complications and pleased with results of the procedure.  Today, he denies symptoms of palpitations, shortness of breath,  lower extremity edema, dizziness, presyncope, or syncope.  He had brief R sided chest pain yesterday, which has resolved.  He declines further workup for this. The patient is otherwise without complaint today.   Past Medical History:  Diagnosis Date  . Hyperlipemia    pt denies  . Obstructive sleep apnea    mild  . Persistent atrial fibrillation [facility])    Past Surgical History:  Procedure Laterality Date  . CARDIOVERSION    . ELECTROPHYSIOLOGIC STUDY N/A 12/28/2014   Procedure: CARDIOVERSION;  Surgeon: Corey Skains, MD;  Location: [facility] ORS;  Service: Cardiovascular;  Laterality: N/A;  . ELECTROPHYSIOLOGIC STUDY N/A 09/07/2015   Procedure: CARDIOVERSION;  Surgeon: Corey Skains, MD;  Location: [facility] ORS;  Service: Cardiovascular;  Laterality: N/A;  . ELECTROPHYSIOLOGIC STUDY N/A 03/18/2016   Procedure: Atrial Fibrillation Ablation;  Surgeon: [name], MD;  Location: [place] CV LAB;  Service: Cardiovascular;  Laterality: N/A;  . TEE WITHOUT CARDIOVERSION N/A 09/07/2015   Procedure: TRANSESOPHAGEAL ECHOCARDIOGRAM (TEE);  Surgeon: Corey Skains, MD;  Location: [facility] ORS;  Service: Cardiovascular;  Laterality: N/A;  . TEE WITHOUT CARDIOVERSION N/A 03/17/2016   Procedure: TRANSESOPHAGEAL ECHOCARDIOGRAM (TEE);  Surgeon: Lelon Perla, MD;  Location: [facility] ENDOSCOPY;  Service: Cardiovascular;  Laterality: N/A;    ROS- all systems are reviewed and negatives except as per HPI above  Current Outpatient Prescriptions  Medication Sig Dispense Refill  . ascorbic acid (VITAMIN C) 1000 MG tablet Take 1,000 mg by  mouth daily.    Marland Kitchen ELIQUIS 5 MG TABS tablet Take 1 tablet (5 mg total) by mouth 2 (two) times daily. 60 tablet 6  . magnesium oxide (MAG-OX) 400 MG tablet Take 400 mg by mouth daily.    . metoprolol succinate (TOPROL-XL) 100 MG 24 hr tablet Take 100 mg by mouth every morning.      No current facility-administered medications for this visit.     Physical Exam: Vitals:   07/16/16 0929  BP: 110/78  Pulse: (!) 57  SpO2: 97%  Weight: 219 lb 12.8 oz (99.7 kg)  Height: 5\' 10"  (1.778 m)    GEN- The patient is well appearing, alert and oriented x 3 today.   Head- normocephalic, atraumatic Eyes-  Sclera clear, conjunctiva pink Ears- hearing intact Oropharynx- clear Lungs- Clear to ausculation bilaterally, normal work of breathing Heart- Regular rate and rhythm, no murmurs, rubs or gallops, PMI not laterally displaced GI- soft, NT, ND, + BS Extremities- no clubbing, cyanosis, or edema  ekg today reveals sinus rhythm  Assessment and Plan:  1. Persistent afib Doing well s/p ablation off AAD therapy chads2vasc score is 0 Stop anticoagulation at this time  Return to see me in 3 months Follow-up with Dr Nehemiah Massed as scheduled  [name] MD, [facility] 07/16/2016 9:33 AM

## 2016-07-16 NOTE — Patient Instructions (Signed)
Medication Instructions:  Your physician has recommended you make the following change in your medication:  1) Stop Eliquis   Labwork: None ordered   Testing/Procedures: None ordered   Follow-Up: Your physician recommends that you schedule a follow-up appointment in: 3 months with Dr Allred   Any Other Special Instructions Will Be Listed Below (If Applicable).     If you need a refill on your cardiac medications before your next appointment, please call your pharmacy.   

## 2016-10-21 ENCOUNTER — Other Ambulatory Visit: Payer: Self-pay | Admitting: Internal Medicine

## 2016-10-23 DIAGNOSIS — I480 Paroxysmal atrial fibrillation: Secondary | ICD-10-CM | POA: Diagnosis not present

## 2016-10-23 DIAGNOSIS — L02415 Cutaneous abscess of right lower limb: Secondary | ICD-10-CM | POA: Diagnosis not present

## 2016-10-27 ENCOUNTER — Encounter: Payer: Self-pay | Admitting: Internal Medicine

## 2016-10-27 ENCOUNTER — Ambulatory Visit (INDEPENDENT_AMBULATORY_CARE_PROVIDER_SITE_OTHER): Payer: BLUE CROSS/BLUE SHIELD | Admitting: Internal Medicine

## 2016-10-27 ENCOUNTER — Encounter (INDEPENDENT_AMBULATORY_CARE_PROVIDER_SITE_OTHER): Payer: Self-pay

## 2016-10-27 VITALS — BP 124/80 | HR 52 | Ht 70.0 in | Wt 219.4 lb

## 2016-10-27 DIAGNOSIS — I481 Persistent atrial fibrillation: Secondary | ICD-10-CM | POA: Diagnosis not present

## 2016-10-27 DIAGNOSIS — I4819 Other persistent atrial fibrillation: Secondary | ICD-10-CM

## 2016-10-27 MED ORDER — METOPROLOL SUCCINATE ER 50 MG PO TB24: 50.0000 mg | ORAL_TABLET | Freq: Every day | ORAL | 3 refills | Status: DC

## 2016-10-27 NOTE — Progress Notes (Signed)
   PCP: Idelle Crouch, MD Primary Cardiologist: Dr Nehemiah Massed Primary EP: Dr Rayann Heman  Francisco Reed is a 56 y.o. male who presents today for routine electrophysiology followup.  Since last being seen in our clinic, the patient reports doing very well.  Today, he denies symptoms of palpitations, chest pain, shortness of breath,  lower extremity edema, dizziness, presyncope, or syncope.  The patient is otherwise without complaint today.   Past Medical History:  Diagnosis Date  . Hyperlipemia    pt denies  . Obstructive sleep apnea    mild  . Persistent atrial fibrillation [facility])    Past Surgical History:  Procedure Laterality Date  . CARDIOVERSION    . ELECTROPHYSIOLOGIC STUDY N/A 12/28/2014   Procedure: CARDIOVERSION;  Surgeon: Corey Skains, MD;  Location: [facility] ORS;  Service: Cardiovascular;  Laterality: N/A;  . ELECTROPHYSIOLOGIC STUDY N/A 09/07/2015   Procedure: CARDIOVERSION;  Surgeon: Corey Skains, MD;  Location: [facility] ORS;  Service: Cardiovascular;  Laterality: N/A;  . ELECTROPHYSIOLOGIC STUDY N/A 03/18/2016   Procedure: Atrial Fibrillation Ablation;  Surgeon: [name], MD;  Location: [place] CV LAB;  Service: Cardiovascular;  Laterality: N/A;  . TEE WITHOUT CARDIOVERSION N/A 09/07/2015   Procedure: TRANSESOPHAGEAL ECHOCARDIOGRAM (TEE);  Surgeon: Corey Skains, MD;  Location: [facility] ORS;  Service: Cardiovascular;  Laterality: N/A;  . TEE WITHOUT CARDIOVERSION N/A 03/17/2016   Procedure: TRANSESOPHAGEAL ECHOCARDIOGRAM (TEE);  Surgeon: Lelon Perla, MD;  Location: [facility] ENDOSCOPY;  Service: Cardiovascular;  Laterality: N/A;    ROS- all systems are reviewed and negatives except as per HPI above  Current Outpatient Prescriptions  Medication Sig Dispense Refill  . ascorbic acid (VITAMIN C) 1000 MG tablet Take 1,000 mg by mouth daily.    . metoprolol succinate (TOPROL-XL) 100 MG 24 hr tablet Take 1 tablet (100 mg total) by mouth daily. 30 tablet 0  .  sulfamethoxazole-trimethoprim (BACTRIM DS,SEPTRA DS) 800-160 MG tablet Take 1 tablet by mouth 2 (two) times daily. for 10 days  0   No current facility-administered medications for this visit.     Physical Exam: Vitals:   10/27/16 1000  BP: 124/80  Pulse: (!) 52  SpO2: 97%  Weight: 219 lb 6.4 oz (99.5 kg)  Height: 5\' 10"  (1.778 m)    GEN- The patient is well appearing, alert and oriented x 3 today.   Head- normocephalic, atraumatic Eyes-  Sclera clear, conjunctiva pink Ears- hearing intact Oropharynx- clear Lungs- Clear to ausculation bilaterally, normal work of breathing Heart- Regular rate and rhythm, no murmurs, rubs or gallops, PMI not laterally displaced GI- soft, NT, ND, + BS Extremities- no clubbing, cyanosis, or edema  EKG tracing ordered today is personally reviewed and shows sinus bradycardia 52 bpm, otherwise normal ekg  Assessment and Plan:  1. Persistent atrial fibrillation Doing very well s/p ablation off AAD therapy chads2vasc score is 0.  No anticoagulation at this time Reduce toprol xl to 50mg  daily  Return in 6 months  [name] MD, [facility] 10/27/2016 10:05 AM

## 2016-10-27 NOTE — Patient Instructions (Addendum)
Medication Instructions:  Your physician has recommended you make the following change in your medication:  1) Decrease Toprol to 50 mg daily   Labwork: None ordered   Testing/Procedures: None ordered   Follow-Up: Your physician wants you to follow-up in: 6 months with Dr Rayann Heman Dennis Bast will receive a reminder letter in the mail two months in advance. If you don't receive a letter, please call our office to schedule the follow-up appointment.   Any Other Special Instructions Will Be Listed Below (If Applicable).     Thank you for choosing [facility]!!     Janan Halter, RN [phone]

## 2016-11-21 ENCOUNTER — Other Ambulatory Visit: Payer: Self-pay | Admitting: Internal Medicine

## 2016-11-21 NOTE — Telephone Encounter (Signed)
Medication Detail    Disp Refills Start End   metoprolol succinate (TOPROL-XL) 50 MG 24 hr tablet 90 tablet 3 10/27/2016    Sig - Route: Take 1 tablet (50 mg total) by mouth daily. - Oral   Sent to pharmacy as: metoprolol succinate (TOPROL-XL) 50 MG 24 hr tablet   Notes to Pharmacy: Please keep appointment 10-27-16 for additional refills thanks.   E-Prescribing Status: Receipt confirmed by pharmacy (10/27/2016 10:18 AM EDT)   Pharmacy   [id number] Lorina Rabon, [place] - [address]

## 2016-12-22 ENCOUNTER — Ambulatory Visit (INDEPENDENT_AMBULATORY_CARE_PROVIDER_SITE_OTHER): Payer: BLUE CROSS/BLUE SHIELD | Admitting: Internal Medicine

## 2016-12-22 ENCOUNTER — Encounter (INDEPENDENT_AMBULATORY_CARE_PROVIDER_SITE_OTHER): Payer: Self-pay

## 2016-12-22 VITALS — BP 90/80 | HR 161 | Ht 70.0 in | Wt 217.4 lb

## 2016-12-22 DIAGNOSIS — I480 Paroxysmal atrial fibrillation: Secondary | ICD-10-CM | POA: Diagnosis not present

## 2016-12-22 MED ORDER — METOPROLOL SUCCINATE ER 100 MG PO TB24: 100.0000 mg | ORAL_TABLET | Freq: Every day | ORAL | 3 refills | Status: DC

## 2016-12-22 MED ORDER — APIXABAN 5 MG PO TABS: 5.0000 mg | ORAL_TABLET | Freq: Two times a day (BID) | ORAL | 3 refills | Status: DC

## 2016-12-22 NOTE — Patient Instructions (Addendum)
Medication Instructions:  Your physician has recommended you make the following change in your medication:  1) Increase Metoprolol to 100 mg daily 2) Start Eliquis 5 mg twice per day   Labwork: Your physician recommends that you return for lab work today:  BMET/CBC   Testing/Procedures: Your physician has requested that you have a TEE/Cardioversion. During a TEE, sound waves are used to create images of your heart. It provides your doctor with information about the size and shape of your heart and how well your heart's chambers and valves are working. In this test, a transducer is attached to the end of a flexible tube that is guided down you throat and into your esophagus (the tube leading from your mouth to your stomach) to get a more detailed image of your heart. Once the TEE has determined that a blood clot is not present, the cardioversion begins. Electrical Cardioversion uses a jolt of electricity to your heart either through paddles or wired patches attached to your chest. This is a controlled, usually prescheduled, procedure. This procedure is done at the hospital and you are not awake during the procedure. You usually go home the day of the procedure. Please see the instruction sheet given to you today for more information.  We will call you tomorrow morning    Follow-Up: Your physician recommends that you schedule a follow-up appointment in: 1 week Afib clinic/ 4 weeks with Dr. Rayann Heman   Any Other Special Instructions Will Be Listed Below (If Applicable).     If you need a refill on your cardiac medications before your next appointment, please call your pharmacy.

## 2016-12-22 NOTE — Progress Notes (Signed)
   PCP: Idelle Crouch, MD Primary Cardiologist: Dr Nehemiah Massed Primary EP: Dr Rayann Heman  Francisco Reed is a 56 y.o. male who presents today for routine electrophysiology followup.  Since last being seen in our clinic, the patient reports doing reasonably well.  Unfortunately, he has returned to afib this past Friday.  He reports tachypalpitations and fatigue.  Today, he denies symptoms of chest pain, shortness of breath,  lower extremity edema, dizziness, presyncope, or syncope.  The patient is otherwise without complaint today.   Past Medical History:  Diagnosis Date  . Hyperlipemia    pt denies  . Obstructive sleep apnea    mild  . Persistent atrial fibrillation [facility])    Past Surgical History:  Procedure Laterality Date  . CARDIOVERSION      ROS- all systems are reviewed and negatives except as per HPI above  Current Outpatient Medications  Medication Sig Dispense Refill  . ascorbic acid (VITAMIN C) 1000 MG tablet Take 1,000 mg by mouth daily.    . metoprolol succinate (TOPROL-XL) 100 MG 24 hr tablet Take 50 mg daily by mouth.     No current facility-administered medications for this visit.     Physical Exam: Vitals:   12/22/16 1550  BP: 90/80  Pulse: (!) 161  SpO2: 98%  Weight: 217 lb 6.4 oz (98.6 kg)  Height: 5\' 10"  (1.778 m)    GEN- The patient is well appearing, alert and oriented x 3 today.   Head- normocephalic, atraumatic Eyes-  Sclera clear, conjunctiva pink Ears- hearing intact Oropharynx- clear Lungs- Clear to ausculation bilaterally, normal work of breathing Heart- irregular rate and rhythm, no murmurs, rubs or gallops, PMI not laterally displaced GI- soft, NT, ND, + BS Extremities- no clubbing, cyanosis, or edema  EKG tracing ordered today is personally reviewed and shows afib, V rate 161 bpm, nonspecific ST/T changes  Assessment and Plan:  1. Persistent afib Unfortunately, he is back in afib. Will increase toprol XL back to 100mg  daily  today Restart anticoagulation with eliquis.  Given significant tachycardia and symptoms, I do not feel that he can wait 3 weeks for cardioversion.  I would therefore advise TEE guided cardioversion after at least 3 doses of eliquis. Risks of TEE guided cardioversion were discussed at length today with the patient who wishes to proceed.  We will therefore schedule TEE guided cardioversion later this week.  Follow-up in AF clinic 1 week after cardioversion. I will see in 4 weeks  Complicated patient with high heart rates at risk for decompensation/ hospitalization.  A high level of decision making was required for this encounter.  Currently, no TEE/ cardioversion spots available.  After being on the phone for 10 minutes with central scheduling, my nurse was instructed to call endoscopy back tomorrow.  I have informed the patient that if we cannot make arrangements at [facility] that we may have to schedule at [facility] or another institution.  He should go to the ED with symptoms of SOB, CP, or presyncope.  [name] MD, [facility] 12/22/2016 4:16 PM

## 2016-12-22 NOTE — H&P (View-Only) (Signed)
   PCP: Idelle Crouch, MD Primary Cardiologist: Dr Nehemiah Massed Primary EP: Dr Rayann Heman  Francisco Reed is a 56 y.o. male who presents today for routine electrophysiology followup.  Since last being seen in our clinic, the patient reports doing reasonably well.  Unfortunately, he has returned to afib this past Friday.  He reports tachypalpitations and fatigue.  Today, he denies symptoms of chest pain, shortness of breath,  lower extremity edema, dizziness, presyncope, or syncope.  The patient is otherwise without complaint today.   Past Medical History:  Diagnosis Date  . Hyperlipemia    pt denies  . Obstructive sleep apnea    mild  . Persistent atrial fibrillation [facility])    Past Surgical History:  Procedure Laterality Date  . CARDIOVERSION      ROS- all systems are reviewed and negatives except as per HPI above  Current Outpatient Medications  Medication Sig Dispense Refill  . ascorbic acid (VITAMIN C) 1000 MG tablet Take 1,000 mg by mouth daily.    . metoprolol succinate (TOPROL-XL) 100 MG 24 hr tablet Take 50 mg daily by mouth.     No current facility-administered medications for this visit.     Physical Exam: Vitals:   12/22/16 1550  BP: 90/80  Pulse: (!) 161  SpO2: 98%  Weight: 217 lb 6.4 oz (98.6 kg)  Height: 5\' 10"  (1.778 m)    GEN- The patient is well appearing, alert and oriented x 3 today.   Head- normocephalic, atraumatic Eyes-  Sclera clear, conjunctiva pink Ears- hearing intact Oropharynx- clear Lungs- Clear to ausculation bilaterally, normal work of breathing Heart- irregular rate and rhythm, no murmurs, rubs or gallops, PMI not laterally displaced GI- soft, NT, ND, + BS Extremities- no clubbing, cyanosis, or edema  EKG tracing ordered today is personally reviewed and shows afib, V rate 161 bpm, nonspecific ST/T changes  Assessment and Plan:  1. Persistent afib Unfortunately, he is back in afib. Will increase toprol XL back to 100mg  daily  today Restart anticoagulation with eliquis.  Given significant tachycardia and symptoms, I do not feel that he can wait 3 weeks for cardioversion.  I would therefore advise TEE guided cardioversion after at least 3 doses of eliquis. Risks of TEE guided cardioversion were discussed at length today with the patient who wishes to proceed.  We will therefore schedule TEE guided cardioversion later this week.  Follow-up in AF clinic 1 week after cardioversion. I will see in 4 weeks  Complicated patient with high heart rates at risk for decompensation/ hospitalization.  A high level of decision making was required for this encounter.  Currently, no TEE/ cardioversion spots available.  After being on the phone for 10 minutes with central scheduling, my nurse was instructed to call endoscopy back tomorrow.  I have informed the patient that if we cannot make arrangements at [facility] that we may have to schedule at [facility] or another institution.  He should go to the ED with symptoms of SOB, CP, or presyncope.  [name] MD, [facility] 12/22/2016 4:16 PM

## 2016-12-23 ENCOUNTER — Telehealth: Payer: Self-pay | Admitting: Internal Medicine

## 2016-12-23 ENCOUNTER — Telehealth: Payer: Self-pay | Admitting: Cardiovascular Disease

## 2016-12-23 LAB — CBC WITH DIFFERENTIAL/PLATELET
BASOS ABS: 0.1 10*3/uL (ref 0.0–0.2)
Basos: 1 %
EOS (ABSOLUTE): 0.1 10*3/uL (ref 0.0–0.4)
Eos: 1 %
HEMOGLOBIN: 15.5 g/dL (ref 13.0–17.7)
Hematocrit: 45.9 % (ref 37.5–51.0)
IMMATURE GRANS (ABS): 0 10*3/uL (ref 0.0–0.1)
Immature Granulocytes: 0 %
LYMPHS: 30 %
Lymphocytes Absolute: 2.7 10*3/uL (ref 0.7–3.1)
MCH: 31.1 pg (ref 26.6–33.0)
MCHC: 33.8 g/dL (ref 31.5–35.7)
MCV: 92 fL (ref 79–97)
MONOCYTES: 11 %
Monocytes Absolute: 1 10*3/uL — ABNORMAL HIGH (ref 0.1–0.9)
Neutrophils Absolute: 5 10*3/uL (ref 1.4–7.0)
Neutrophils: 57 %
Platelets: 236 10*3/uL (ref 150–379)
RBC: 4.98 x10E6/uL (ref 4.14–5.80)
RDW: 13.4 % (ref 12.3–15.4)
WBC: 8.8 10*3/uL (ref 3.4–10.8)

## 2016-12-23 LAB — BASIC METABOLIC PANEL
BUN/Creatinine Ratio: 12 (ref 9–20)
BUN: 13 mg/dL (ref 6–24)
CALCIUM: 9.5 mg/dL (ref 8.7–10.2)
CO2: 19 mmol/L — AB (ref 20–29)
CREATININE: 1.06 mg/dL (ref 0.76–1.27)
Chloride: 103 mmol/L (ref 96–106)
GFR calc Af Amer: 91 mL/min/{1.73_m2} (ref >59)
GFR, EST NON AFRICAN AMERICAN: 79 mL/min/{1.73_m2} (ref >59)
GLUCOSE: 96 mg/dL (ref 65–99)
Potassium: 4.3 mmol/L (ref 3.5–5.2)
Sodium: 139 mmol/L (ref 134–144)

## 2016-12-23 NOTE — Telephone Encounter (Signed)
Spoke with patient in regards to TEE/Cardioversion instructions.  Patient verbalized understanding

## 2016-12-23 NOTE — Telephone Encounter (Signed)
Patient returning call for lab results. 

## 2016-12-23 NOTE — Progress Notes (Unsigned)
You are scheduled for a Cardioversion / TEE Cardioversion on *** with Dr. Marland Kitchen Please arrive at the [facility] at *** a.m. / p.m. on the day of your procedure.  1) DIET:            A) Nothing to eat or drink after midnight except your medications with a                  sip of water.            B) May have clear liquid breakfast, then nothing to eat or drink after ***                    a.m. / p.m.  Clear liquids include: water, broth, Sprite, Ginger Ale, black               coffee, tea (no sugar), cranberry / grape / apple juice, jello (not red),                     popsicle from clear juices (not red).  2) Come to the [place] office on *** for lab work. The lab at [facility] is open from 8:30 a.m. to 1:30 p.m. and 2:30 p.m. to 5:00 p.m. The lab at [facility] is open from 7:30 a.m. to 5:30 p.m. you do not have to be fasting.  3) MAKE SURE YOU TAKE YOUR COUMADIN.  4) A)  DO NOT TAKE these medications before your procedure: ***  B) YOU MAY TAKE ALL of your remaining medications with a small amount of water.  C) START NEW medications: ***  5) Must have a responsible person to drive you home.  6) Bring a current list of your medications and current insurance cards.   * Special note: Every effort is made to have your procedure done on time. Occasionally there are emergencies that present themselves at the hospital that may cause delays. Please be patient if a delay does occur.

## 2016-12-23 NOTE — Telephone Encounter (Signed)
Patient calling, has to have cardioversion tomorrow (with Dr. Johnsie Cancel)  and is calling for instructions.

## 2016-12-24 ENCOUNTER — Other Ambulatory Visit: Payer: Self-pay

## 2016-12-24 ENCOUNTER — Ambulatory Visit (HOSPITAL_COMMUNITY): Payer: BLUE CROSS/BLUE SHIELD | Admitting: Certified Registered"

## 2016-12-24 ENCOUNTER — Ambulatory Visit (HOSPITAL_COMMUNITY)
Admission: RE | Admit: 2016-12-24 | Discharge: 2016-12-24 | Disposition: A | Discharge status: Home / Self Care | Payer: BLUE CROSS/BLUE SHIELD | Source: Ambulatory Visit | Attending: Cardiovascular Disease | Admitting: Cardiovascular Disease

## 2016-12-24 ENCOUNTER — Encounter (HOSPITAL_COMMUNITY)
Admission: RE | Disposition: A | Discharge status: Home / Self Care | Payer: Self-pay | Source: Ambulatory Visit | Attending: Cardiovascular Disease

## 2016-12-24 ENCOUNTER — Ambulatory Visit (HOSPITAL_BASED_OUTPATIENT_CLINIC_OR_DEPARTMENT_OTHER): Payer: BLUE CROSS/BLUE SHIELD

## 2016-12-24 ENCOUNTER — Encounter (HOSPITAL_COMMUNITY): Payer: Self-pay

## 2016-12-24 DIAGNOSIS — Z7901 Long term (current) use of anticoagulants: Secondary | ICD-10-CM | POA: Diagnosis not present

## 2016-12-24 DIAGNOSIS — Z79899 Other long term (current) drug therapy: Secondary | ICD-10-CM | POA: Diagnosis not present

## 2016-12-24 DIAGNOSIS — G4733 Obstructive sleep apnea (adult) (pediatric): Secondary | ICD-10-CM | POA: Diagnosis not present

## 2016-12-24 DIAGNOSIS — E785 Hyperlipidemia, unspecified: Secondary | ICD-10-CM | POA: Diagnosis not present

## 2016-12-24 DIAGNOSIS — Z6831 Body mass index (BMI) 31.0-31.9, adult: Secondary | ICD-10-CM | POA: Insufficient documentation

## 2016-12-24 DIAGNOSIS — D759 Disease of blood and blood-forming organs, unspecified: Secondary | ICD-10-CM | POA: Diagnosis not present

## 2016-12-24 DIAGNOSIS — I481 Persistent atrial fibrillation: Secondary | ICD-10-CM | POA: Insufficient documentation

## 2016-12-24 DIAGNOSIS — E669 Obesity, unspecified: Secondary | ICD-10-CM | POA: Insufficient documentation

## 2016-12-24 DIAGNOSIS — I351 Nonrheumatic aortic (valve) insufficiency: Secondary | ICD-10-CM

## 2016-12-24 HISTORY — PX: CARDIOVERSION: SHX1299

## 2016-12-24 HISTORY — PX: TEE WITHOUT CARDIOVERSION: SHX5443

## 2016-12-24 SURGERY — ECHOCARDIOGRAM, TRANSESOPHAGEAL
Anesthesia: Monitor Anesthesia Care

## 2016-12-24 MED ORDER — SODIUM CHLORIDE 0.9 % IV SOLN: INTRAVENOUS | Status: DC

## 2016-12-24 MED ORDER — PROPOFOL 500 MG/50ML IV EMUL
INTRAVENOUS | Status: DC | PRN
  Administered 2016-12-24: 100 ug/kg/min via INTRAVENOUS

## 2016-12-24 MED ORDER — LACTATED RINGERS IV SOLN
INTRAVENOUS | Status: DC | PRN
  Administered 2016-12-24: 12:00:00 via INTRAVENOUS

## 2016-12-24 MED ORDER — LACTATED RINGERS IV SOLN
INTRAVENOUS | Status: DC
  Administered 2016-12-24: 11:00:00 via INTRAVENOUS

## 2016-12-24 MED ORDER — BUTAMBEN-TETRACAINE-BENZOCAINE 2-2-14 % EX AERO
INHALATION_SPRAY | CUTANEOUS | Status: DC | PRN
  Administered 2016-12-24: 2 via TOPICAL

## 2016-12-24 NOTE — Interval H&P Note (Signed)
History and Physical Interval Note:  12/24/2016 11:36 AM  Francisco Reed  has presented today for surgery, with the diagnosis of a fib  The various methods of treatment have been discussed with the patient and family. After consideration of risks, benefits and other options for treatment, the patient has consented to  Procedure(s): TRANSESOPHAGEAL ECHOCARDIOGRAM (TEE) (N/A) CARDIOVERSION (N/A) as a surgical intervention .  The patient's history has been reviewed, patient examined, no change in status, stable for surgery.  I have reviewed the patient's chart and labs.  Questions were answered to the patient's satisfaction.     Jenkins Rouge

## 2016-12-24 NOTE — CV Procedure (Signed)
TEE/DCC: Anesthesia propofol Dr Gifford Shave  LVE EF 30-35% diffuse hypokinesis Moderate RVE Trace MR Mild LAE No ASD/PFO Mild AR No LAA thrombus  DCC x 2 120 J then 200J converted from rapid afib rate 144 to NSR rate 72 No immediate neurologic sequela On Eliquis  [employer]

## 2016-12-24 NOTE — Anesthesia Postprocedure Evaluation (Signed)
Anesthesia Post Note  Patient: [name]  Procedure(s) Performed: TRANSESOPHAGEAL ECHOCARDIOGRAM (TEE) (N/A ) CARDIOVERSION (N/A )     Patient location during evaluation: Endoscopy Anesthesia Type: MAC Level of consciousness: awake and alert Pain management: pain level controlled Vital Signs Assessment: post-procedure vital signs reviewed and stable Respiratory status: spontaneous breathing, nonlabored ventilation, respiratory function stable and patient connected to nasal cannula oxygen Cardiovascular status: stable and blood pressure returned to baseline Postop Assessment: no apparent nausea or vomiting Anesthetic complications: no Comments: No antiemetics given due to MAC procedure, and no patient complaint of nausea/vomiting.     Last Vitals:  Vitals:   12/24/16 1230 12/24/16 1240  BP: 97/74 108/69  Pulse: 70 64  Resp: 15 12  Temp:    SpO2: 94% 96%    Last Pain:  Vitals:   12/24/16 1108  TempSrc: Oral                 Francisco Reed

## 2016-12-24 NOTE — Progress Notes (Signed)
  Echocardiogram Echocardiogram Transesophageal has been performed.  Francisco Reed M 12/24/2016, 12:19 PM

## 2016-12-24 NOTE — Discharge Instructions (Signed)
TEE ° °YOU HAD AN CARDIAC PROCEDURE TODAY: Refer to the procedure report and other information in the discharge instructions given to you for any specific questions about what was found during the examination. If this information does not answer your questions, please call [facility] office at [phone] to clarify.  ° °DIET: Your first meal following the procedure should be a light meal and then it is ok to progress to your normal diet. A half-sandwich or bowl of soup is an example of a good first meal. Heavy or fried foods are harder to digest and may make you feel nauseous or bloated. Drink plenty of fluids but you should avoid alcoholic beverages for 24 hours. If you had a esophageal dilation, please see attached instructions for diet.  ° °ACTIVITY: Your care partner should take you home directly after the procedure. You should plan to take it easy, moving slowly for the rest of the day. You can resume normal activity the day after the procedure however YOU SHOULD NOT DRIVE, use power tools, machinery or perform tasks that involve climbing or major physical exertion for 24 hours (because of the sedation medicines used during the test).  ° °SYMPTOMS TO REPORT IMMEDIATELY: °A cardiologist can be reached at any hour. Please call [phone] for any of the following symptoms:  °Vomiting of blood or coffee ground material  °New, significant abdominal pain  °New, significant chest pain or pain under the shoulder blades  °Painful or persistently difficult swallowing  °New shortness of breath  °Black, tarry-looking or red, bloody stools ° °FOLLOW UP:  °Please also call with any specific questions about appointments or follow up tests. ° °Electrical Cardioversion, Care After °This sheet gives you information about how to care for yourself after your procedure. Your health care provider may also give you more specific instructions. If you have problems or questions, contact your health care provider. °What can I  expect after the procedure? °After the procedure, it is common to have: °· Some redness on the skin where the shocks were given. ° °Follow these instructions at home: °· Do not drive for 24 hours if you were given a medicine to help you relax (sedative). °· Take over-the-counter and prescription medicines only as told by your health care provider. °· Ask your health care provider how to check your pulse. Check it often. °· Rest for 48 hours after the procedure or as told by your health care provider. °· Avoid or limit your caffeine use as told by your health care provider. °Contact a health care provider if: °· You feel like your heart is beating too quickly or your pulse is not regular. °· You have a serious muscle cramp that does not go away. °Get help right away if: °· You have discomfort in your chest. °· You are dizzy or you feel faint. °· You have trouble breathing or you are short of breath. °· Your speech is slurred. °· You have trouble moving an arm or leg on one side of your body. °· Your fingers or toes turn cold or blue. °This information is not intended to replace advice given to you by your health care provider. Make sure you discuss any questions you have with your health care provider. °Document Released: 11/24/2012 Document Revised: 09/07/2015 Document Reviewed: 08/10/2015 °Elsevier Interactive Patient Education © 2018 [employer] Inc[employer] ° °

## 2016-12-24 NOTE — Transfer of Care (Signed)
Immediate Anesthesia Transfer of Care Note  Patient: Francisco Reed  Procedure(s) Performed: TRANSESOPHAGEAL ECHOCARDIOGRAM (TEE) (N/A ) CARDIOVERSION (N/A )  Patient Location: PACU  Anesthesia Type:General  Level of Consciousness: sedated and responds to stimulation  Airway & Oxygen Therapy: Patient Spontanous Breathing and Patient connected to nasal cannula oxygen  Post-op Assessment: Report given to RN and Post -op Vital signs reviewed and stable  Post vital signs: Reviewed and stable  Last Vitals:  Vitals:   12/24/16 1206 12/24/16 1207  BP: 92/70   Pulse: 67 67  Resp: 16 15  Temp:    SpO2: 97% 97%    Last Pain:  Vitals:   12/24/16 1108  TempSrc: Oral         Complications: No apparent anesthesia complications

## 2016-12-24 NOTE — Anesthesia Preprocedure Evaluation (Addendum)
Anesthesia Evaluation  Patient identified by MRN, date of birth, ID band Patient awake    Reviewed: Allergy & Precautions, NPO status , Patient's Chart, lab work & pertinent test results, reviewed documented beta blocker date and time   Airway Mallampati: II  TM Distance: >3 FB Neck ROM: Full    Dental  (+) Teeth Intact, Dental Advisory Given   Pulmonary sleep apnea and Continuous Positive Airway Pressure Ventilation ,    Pulmonary exam normal breath sounds clear to auscultation       Cardiovascular + dysrhythmias Atrial Fibrillation  Rhythm:Irregular Rate:Normal     Neuro/Psych negative neurological ROS  negative psych ROS   GI/Hepatic negative GI ROS, Neg liver ROS,   Endo/Other  Obesity   Renal/GU negative Renal ROS     Musculoskeletal negative musculoskeletal ROS (+)   Abdominal   Peds  Hematology  (+) Blood dyscrasia (Eliquis), ,   Anesthesia Other Findings Day of surgery medications reviewed with the patient.  Reproductive/Obstetrics                            Anesthesia Physical Anesthesia Plan  ASA: III  Anesthesia Plan: MAC   Post-op Pain Management:    Induction: Intravenous  PONV Risk Score and Plan: 1 and Propofol infusion  Airway Management Planned: Nasal Cannula  Additional Equipment:   Intra-op Plan:   Post-operative Plan:   Informed Consent: I have reviewed the patients History and Physical, chart, labs and discussed the procedure including the risks, benefits and alternatives for the proposed anesthesia with the patient or authorized representative who has indicated his/her understanding and acceptance.   Dental advisory given  Plan Discussed with: CRNA and Anesthesiologist  Anesthesia Plan Comments: (Discussed risks/benefits/alternatives to MAC sedation including need for ventilatory support, hypotension, need for conversion to general anesthesia.  All  patient questions answered.  Patient/guardian wishes to proceed.)        Anesthesia Quick Evaluation

## 2016-12-26 ENCOUNTER — Encounter (HOSPITAL_COMMUNITY): Payer: Self-pay | Admitting: Cardiovascular Disease

## 2017-02-13 DIAGNOSIS — L0292 Furuncle, unspecified: Secondary | ICD-10-CM | POA: Diagnosis not present

## 2017-02-13 DIAGNOSIS — L02424 Furuncle of left upper limb: Secondary | ICD-10-CM | POA: Diagnosis not present

## 2017-03-17 ENCOUNTER — Telehealth: Payer: Self-pay | Admitting: Internal Medicine

## 2017-03-17 NOTE — Telephone Encounter (Signed)
Spoke with patient who states he feels like is in a fib since Sunday night. He states he had a stressful event occur and immediately went into a fib. He states HR is currently 69-140 bpm which he monitors through his telephone. He has hx of afib ablation 1/18 and had DCCV 11/18. He has not followed up since that time. He c/o weakness and pain in lower back since being back in afib. He denies dizziness, light-headedness, or SOB. He states the back pain may be from sleeping on the sofa Sunday night; he is taking Tylenol for the pain. He states he is compliant with medications as prescribed and has not missed a dose of eliquis. He states he only wants to see Dr. Rayann Heman. I was able to schedule patient with Dr. Rayann Heman for tomorrow 1/30 at 10:00 am. I advised him that if HR remains >130 bpm or if he develops worsening symptoms, to go to the hospital for evaluation. He verbalized understanding and agreement and thanked me for the call.

## 2017-03-17 NOTE — Telephone Encounter (Signed)
New message   Patient c/o Palpitations:  High priority if patient c/o lightheadedness, shortness of breath, or chest pain  1) How long have you had palpitations/irregular HR/ Afib? Are you having the symptoms now? Since Sunday 1/27 night at 11pm  2) Are you currently experiencing lightheadedness, SOB or CP? no  3) Do you have a history of afib (atrial fibrillation) or irregular heart rhythm? yes  4) Have you checked your BP or HR? (document readings if available): yes as low as 35 and high as 200(checks on phone)  5) Are you experiencing any other symptoms? Pain in lower back and weakness  Patient says that he has Afib, it went out and he thinks he needs to be shocked again

## 2017-03-18 ENCOUNTER — Telehealth: Payer: Self-pay | Admitting: Internal Medicine

## 2017-03-18 ENCOUNTER — Ambulatory Visit: Payer: BLUE CROSS/BLUE SHIELD | Admitting: Internal Medicine

## 2017-03-18 NOTE — Telephone Encounter (Signed)
New message   Patient had an appt with [facility] today 1/30 but had to cancel due to provider being out Pt says he has been experiencing afib since Sunday night and has been shocked 7 times in the past I schedule pt for 2/4 but he would like to be seen this week please call

## 2017-03-18 NOTE — Telephone Encounter (Signed)
Spoke with patient and he would like to be seen tomorrow.  I explained to him that I was not sure what would be able to be done before Mon when he is scheduled with [facility] but if it would make him feel better to be seen in the afib clinic would get him scheduled.  I have scheduled him for tomorrow at 8:30am

## 2017-03-19 ENCOUNTER — Ambulatory Visit (HOSPITAL_COMMUNITY)
Admission: RE | Admit: 2017-03-19 | Discharge: 2017-03-19 | Disposition: A | Discharge status: Home / Self Care | Payer: BLUE CROSS/BLUE SHIELD | Source: Ambulatory Visit | Attending: Nurse Practitioner | Admitting: Nurse Practitioner

## 2017-03-19 ENCOUNTER — Encounter (HOSPITAL_COMMUNITY): Payer: Self-pay | Admitting: Nurse Practitioner

## 2017-03-19 VITALS — BP 124/68 | HR 142 | Ht 70.0 in | Wt 223.0 lb

## 2017-03-19 DIAGNOSIS — Z9889 Other specified postprocedural states: Secondary | ICD-10-CM | POA: Diagnosis not present

## 2017-03-19 DIAGNOSIS — E785 Hyperlipidemia, unspecified: Secondary | ICD-10-CM | POA: Insufficient documentation

## 2017-03-19 DIAGNOSIS — Z7901 Long term (current) use of anticoagulants: Secondary | ICD-10-CM | POA: Insufficient documentation

## 2017-03-19 DIAGNOSIS — I481 Persistent atrial fibrillation: Secondary | ICD-10-CM | POA: Diagnosis not present

## 2017-03-19 DIAGNOSIS — I480 Paroxysmal atrial fibrillation: Secondary | ICD-10-CM | POA: Diagnosis not present

## 2017-03-19 DIAGNOSIS — G4733 Obstructive sleep apnea (adult) (pediatric): Secondary | ICD-10-CM | POA: Insufficient documentation

## 2017-03-19 DIAGNOSIS — I4891 Unspecified atrial fibrillation: Secondary | ICD-10-CM | POA: Diagnosis not present

## 2017-03-19 DIAGNOSIS — Z79899 Other long term (current) drug therapy: Secondary | ICD-10-CM | POA: Insufficient documentation

## 2017-03-19 LAB — CBC
HCT: 44 % (ref 39.0–52.0)
Hemoglobin: 14.9 g/dL (ref 13.0–17.0)
MCH: 31.1 pg (ref 26.0–34.0)
MCHC: 33.9 g/dL (ref 30.0–36.0)
MCV: 91.9 fL (ref 78.0–100.0)
PLATELETS: 201 10*3/uL (ref 150–400)
RBC: 4.79 MIL/uL (ref 4.22–5.81)
RDW: 12.8 % (ref 11.5–15.5)
WBC: 5.4 10*3/uL (ref 4.0–10.5)

## 2017-03-19 LAB — BASIC METABOLIC PANEL
ANION GAP: 8 (ref 5–15)
BUN: 14 mg/dL (ref 6–20)
CO2: 22 mmol/L (ref 22–32)
Calcium: 9 mg/dL (ref 8.9–10.3)
Chloride: 110 mmol/L (ref 101–111)
Creatinine, Ser: 0.99 mg/dL (ref 0.61–1.24)
GFR calc Af Amer: >60 mL/min (ref >60)
Glucose, Bld: 131 mg/dL — ABNORMAL HIGH (ref 65–99)
POTASSIUM: 3.9 mmol/L (ref 3.5–5.1)
Sodium: 140 mmol/L (ref 135–145)

## 2017-03-19 NOTE — Progress Notes (Signed)
Primary Care Physician: Idelle Crouch, MD Referring Physician: Dr. Rosebud Poles is a 57 y.o. male with a h/o afib s/p ablation one year ago.  He had return to afib in November  and successful cardioversion.   He went back into afib Sunday PM after an emotional upset. In the office today, he is in afib with RVR at 143 bpm. He has fatigue in afib. His wife feels he needs something to smooth out his mood as he is quick to get angry.  Today, he denies symptoms of palpitations, chest pain, shortness of breath, orthopnea, PND, lower extremity edema, dizziness, presyncope, syncope, or neurologic sequela. + for fatigue The patient is tolerating medications without difficulties and is otherwise without complaint today.   Past Medical History:  Diagnosis Date  . Hyperlipemia    pt denies  . Obstructive sleep apnea    mild  . Persistent atrial fibrillation [facility])    Past Surgical History:  Procedure Laterality Date  . CARDIOVERSION    . CARDIOVERSION N/A 12/24/2016   Procedure: CARDIOVERSION;  Surgeon: Josue Hector, MD;  Location: [facility] ENDOSCOPY;  Service: Cardiovascular;  Laterality: N/A;  . ELECTROPHYSIOLOGIC STUDY N/A 12/28/2014   Procedure: CARDIOVERSION;  Surgeon: Corey Skains, MD;  Location: [facility] ORS;  Service: Cardiovascular;  Laterality: N/A;  . ELECTROPHYSIOLOGIC STUDY N/A 09/07/2015   Procedure: CARDIOVERSION;  Surgeon: Corey Skains, MD;  Location: [facility] ORS;  Service: Cardiovascular;  Laterality: N/A;  . ELECTROPHYSIOLOGIC STUDY N/A 03/18/2016   Procedure: Atrial Fibrillation Ablation;  Surgeon: [name], MD;  Location: [place] CV LAB;  Service: Cardiovascular;  Laterality: N/A;  . TEE WITHOUT CARDIOVERSION N/A 09/07/2015   Procedure: TRANSESOPHAGEAL ECHOCARDIOGRAM (TEE);  Surgeon: Corey Skains, MD;  Location: [facility] ORS;  Service: Cardiovascular;  Laterality: N/A;  . TEE WITHOUT CARDIOVERSION N/A 03/17/2016   Procedure: TRANSESOPHAGEAL ECHOCARDIOGRAM  (TEE);  Surgeon: Lelon Perla, MD;  Location: [facility] ENDOSCOPY;  Service: Cardiovascular;  Laterality: N/A;  . TEE WITHOUT CARDIOVERSION N/A 12/24/2016   Procedure: TRANSESOPHAGEAL ECHOCARDIOGRAM (TEE);  Surgeon: Josue Hector, MD;  Location: [facility] ENDOSCOPY;  Service: Cardiovascular;  Laterality: N/A;    Current Outpatient Medications  Medication Sig Dispense Refill  . apixaban (ELIQUIS) 5 MG TABS tablet Take 1 tablet (5 mg total) 2 (two) times daily by mouth. 60 tablet 3  . ascorbic acid (VITAMIN C) 1000 MG tablet Take 1,000 mg by mouth daily.    . Biotin 10000 MCG TABS Take by mouth.    . metoprolol succinate (TOPROL-XL) 100 MG 24 hr tablet Take 1 tablet (100 mg total) daily by mouth. Take with or immediately following a meal. 90 tablet 3   No current facility-administered medications for this encounter.     Allergies  Allergen Reactions  . No Known Allergies     Social History   Socioeconomic History  . Marital status: Married    Spouse name: Not on file  . Number of children: Not on file  . Years of education: Not on file  . Highest education level: Not on file  Social Needs  . Financial resource strain: Not on file  . Food insecurity - worry: Not on file  . Food insecurity - inability: Not on file  . Transportation needs - medical: Not on file  . Transportation needs - non-medical: Not on file  Occupational History  . Not on file  Tobacco Use  . Smoking status: Never Smoker  . Smokeless tobacco: Never  Used  Substance and Sexual Activity  . Alcohol use: No  . Drug use: No  . Sexual activity: Not on file  Other Topics Concern  . Not on file  Social History Narrative   Pt lives in [place] with spouse.  Works as a Chief Strategy Officer.    No family history on file.  ROS- All systems are reviewed and negative except as per the HPI above  Physical Exam: Vitals:   03/19/17 0831  BP: 124/68  Pulse: (!) 142  Weight: 223 lb (101.2 kg)  Height: 5\' 10"  (1.778 m)   Wt  Readings from Last 3 Encounters:  03/19/17 223 lb (101.2 kg)  12/22/16 217 lb 6.4 oz (98.6 kg)  10/27/16 219 lb 6.4 oz (99.5 kg)    Labs: Lab Results  Component Value Date   NA 139 12/22/2016   K 4.3 12/22/2016   CL 103 12/22/2016   CO2 19 (L) 12/22/2016   GLUCOSE 96 12/22/2016   BUN 13 12/22/2016   CREATININE 1.06 12/22/2016   CALCIUM 9.5 12/22/2016   MG 1.8 09/06/2013   Lab Results  Component Value Date   INR 1.2 09/06/2013   No results found for: CHOL, HDL, LDLCALC, TRIG   GEN- The patient is well appearing, alert and oriented x 3 today.   Head- normocephalic, atraumatic Eyes-  Sclera clear, conjunctiva pink Ears- hearing intact Oropharynx- clear Neck- supple, no JVP Lymph- no cervical lymphadenopathy Lungs- Clear to ausculation bilaterally, normal work of breathing Heart- irregular rate and rhythm, no murmurs, rubs or gallops, PMI not laterally displaced GI- soft, NT, ND, + BS Extremities- no clubbing, cyanosis, or edema MS- no significant deformity or atrophy Skin- no rash or lesion Psych- euthymic mood, full affect Neuro- strength and sensation are intact  EKG- afib at 142 ms, qrs int 92 ms, qtc at 492 ms Epic records reviewed   Assessment and Plan: 1. Afib S/p ablation one year ago and did well until November at which time he required cardioversion and has return to afib since Sunday  He has an appointment with Dr. Rayann Heman on Monday and encouraged him to discuss repeat ablation In the interim, cardioversion is scheduled for tommorrow Continue on Eliquis 5 mg bid, chadsvasc score of 0, states no missed doses for the last 3 weeks Continue metoprolol succinate 100 mg daily   F/u with Dr. Rayann Heman 2/4   Geroge Baseman. Antonios Ostrow, [facility] [address] [place], [place] [address] [phone]

## 2017-03-19 NOTE — Patient Instructions (Addendum)
Cardioversion scheduled for Friday, February 1st  - Arrive at the [facility] and go to admitting at 11:30AM  - Do not eat or drink anything after midnight the night prior to your procedure.  - Take all your medication with a sip of water prior to arrival.  - Do Not miss any doses of eliquis.  - You will not be able to drive home after your procedure.

## 2017-03-19 NOTE — H&P (View-Only) (Signed)
Primary Care Physician: Idelle Crouch, MD Referring Physician: Dr. Rosebud Poles is a 57 y.o. male with a h/o afib s/p ablation one year ago.  He had return to afib in November  and successful cardioversion.   He went back into afib Sunday PM after an emotional upset. In the office today, he is in afib with RVR at 143 bpm. He has fatigue in afib. His wife feels he needs something to smooth out his mood as he is quick to get angry.  Today, he denies symptoms of palpitations, chest pain, shortness of breath, orthopnea, PND, lower extremity edema, dizziness, presyncope, syncope, or neurologic sequela. + for fatigue The patient is tolerating medications without difficulties and is otherwise without complaint today.   Past Medical History:  Diagnosis Date  . Hyperlipemia    pt denies  . Obstructive sleep apnea    mild  . Persistent atrial fibrillation [facility])    Past Surgical History:  Procedure Laterality Date  . CARDIOVERSION    . CARDIOVERSION N/A 12/24/2016   Procedure: CARDIOVERSION;  Surgeon: Josue Hector, MD;  Location: [facility] ENDOSCOPY;  Service: Cardiovascular;  Laterality: N/A;  . ELECTROPHYSIOLOGIC STUDY N/A 12/28/2014   Procedure: CARDIOVERSION;  Surgeon: Corey Skains, MD;  Location: [facility] ORS;  Service: Cardiovascular;  Laterality: N/A;  . ELECTROPHYSIOLOGIC STUDY N/A 09/07/2015   Procedure: CARDIOVERSION;  Surgeon: Corey Skains, MD;  Location: [facility] ORS;  Service: Cardiovascular;  Laterality: N/A;  . ELECTROPHYSIOLOGIC STUDY N/A 03/18/2016   Procedure: Atrial Fibrillation Ablation;  Surgeon: [name], MD;  Location: [place] CV LAB;  Service: Cardiovascular;  Laterality: N/A;  . TEE WITHOUT CARDIOVERSION N/A 09/07/2015   Procedure: TRANSESOPHAGEAL ECHOCARDIOGRAM (TEE);  Surgeon: Corey Skains, MD;  Location: [facility] ORS;  Service: Cardiovascular;  Laterality: N/A;  . TEE WITHOUT CARDIOVERSION N/A 03/17/2016   Procedure: TRANSESOPHAGEAL ECHOCARDIOGRAM  (TEE);  Surgeon: Lelon Perla, MD;  Location: [facility] ENDOSCOPY;  Service: Cardiovascular;  Laterality: N/A;  . TEE WITHOUT CARDIOVERSION N/A 12/24/2016   Procedure: TRANSESOPHAGEAL ECHOCARDIOGRAM (TEE);  Surgeon: Josue Hector, MD;  Location: [facility] ENDOSCOPY;  Service: Cardiovascular;  Laterality: N/A;    Current Outpatient Medications  Medication Sig Dispense Refill  . apixaban (ELIQUIS) 5 MG TABS tablet Take 1 tablet (5 mg total) 2 (two) times daily by mouth. 60 tablet 3  . ascorbic acid (VITAMIN C) 1000 MG tablet Take 1,000 mg by mouth daily.    . Biotin 10000 MCG TABS Take by mouth.    . metoprolol succinate (TOPROL-XL) 100 MG 24 hr tablet Take 1 tablet (100 mg total) daily by mouth. Take with or immediately following a meal. 90 tablet 3   No current facility-administered medications for this encounter.     Allergies  Allergen Reactions  . No Known Allergies     Social History   Socioeconomic History  . Marital status: Married    Spouse name: Not on file  . Number of children: Not on file  . Years of education: Not on file  . Highest education level: Not on file  Social Needs  . Financial resource strain: Not on file  . Food insecurity - worry: Not on file  . Food insecurity - inability: Not on file  . Transportation needs - medical: Not on file  . Transportation needs - non-medical: Not on file  Occupational History  . Not on file  Tobacco Use  . Smoking status: Never Smoker  . Smokeless tobacco: Never  Used  Substance and Sexual Activity  . Alcohol use: No  . Drug use: No  . Sexual activity: Not on file  Other Topics Concern  . Not on file  Social History Narrative   Pt lives in [place] with spouse.  Works as a Chief Strategy Officer.    No family history on file.  ROS- All systems are reviewed and negative except as per the HPI above  Physical Exam: Vitals:   03/19/17 0831  BP: 124/68  Pulse: (!) 142  Weight: 223 lb (101.2 kg)  Height: 5\' 10"  (1.778 m)   Wt  Readings from Last 3 Encounters:  03/19/17 223 lb (101.2 kg)  12/22/16 217 lb 6.4 oz (98.6 kg)  10/27/16 219 lb 6.4 oz (99.5 kg)    Labs: Lab Results  Component Value Date   NA 139 12/22/2016   K 4.3 12/22/2016   CL 103 12/22/2016   CO2 19 (L) 12/22/2016   GLUCOSE 96 12/22/2016   BUN 13 12/22/2016   CREATININE 1.06 12/22/2016   CALCIUM 9.5 12/22/2016   MG 1.8 09/06/2013   Lab Results  Component Value Date   INR 1.2 09/06/2013   No results found for: CHOL, HDL, LDLCALC, TRIG   GEN- The patient is well appearing, alert and oriented x 3 today.   Head- normocephalic, atraumatic Eyes-  Sclera clear, conjunctiva pink Ears- hearing intact Oropharynx- clear Neck- supple, no JVP Lymph- no cervical lymphadenopathy Lungs- Clear to ausculation bilaterally, normal work of breathing Heart- irregular rate and rhythm, no murmurs, rubs or gallops, PMI not laterally displaced GI- soft, NT, ND, + BS Extremities- no clubbing, cyanosis, or edema MS- no significant deformity or atrophy Skin- no rash or lesion Psych- euthymic mood, full affect Neuro- strength and sensation are intact  EKG- afib at 142 ms, qrs int 92 ms, qtc at 492 ms Epic records reviewed   Assessment and Plan: 1. Afib S/p ablation one year ago and did well until November at which time he required cardioversion and has return to afib since Sunday  He has an appointment with Dr. Rayann Heman on Monday and encouraged him to discuss repeat ablation In the interim, cardioversion is scheduled for tommorrow Continue on Eliquis 5 mg bid, chadsvasc score of 0, states no missed doses for the last 3 weeks Continue metoprolol succinate 100 mg daily   F/u with Dr. Rayann Heman 2/4   Geroge Baseman. Ashten Sarnowski, [facility] [address] [place], [place] [address] [phone]

## 2017-03-20 ENCOUNTER — Encounter (HOSPITAL_COMMUNITY)
Admission: RE | Disposition: A | Discharge status: Home / Self Care | Payer: Self-pay | Source: Ambulatory Visit | Attending: Internal Medicine

## 2017-03-20 ENCOUNTER — Ambulatory Visit (HOSPITAL_COMMUNITY)
Admission: RE | Admit: 2017-03-20 | Discharge: 2017-03-20 | Disposition: A | Discharge status: Home / Self Care | Payer: BLUE CROSS/BLUE SHIELD | Source: Ambulatory Visit | Attending: Internal Medicine | Admitting: Internal Medicine

## 2017-03-20 ENCOUNTER — Encounter (HOSPITAL_COMMUNITY): Payer: Self-pay | Admitting: *Deleted

## 2017-03-20 ENCOUNTER — Ambulatory Visit (HOSPITAL_COMMUNITY): Payer: BLUE CROSS/BLUE SHIELD | Admitting: Anesthesiology

## 2017-03-20 DIAGNOSIS — I481 Persistent atrial fibrillation: Secondary | ICD-10-CM | POA: Diagnosis not present

## 2017-03-20 DIAGNOSIS — Z7901 Long term (current) use of anticoagulants: Secondary | ICD-10-CM | POA: Insufficient documentation

## 2017-03-20 DIAGNOSIS — I4891 Unspecified atrial fibrillation: Secondary | ICD-10-CM

## 2017-03-20 DIAGNOSIS — G4733 Obstructive sleep apnea (adult) (pediatric): Secondary | ICD-10-CM | POA: Diagnosis not present

## 2017-03-20 DIAGNOSIS — E785 Hyperlipidemia, unspecified: Secondary | ICD-10-CM | POA: Diagnosis not present

## 2017-03-20 HISTORY — PX: CARDIOVERSION: SHX1299

## 2017-03-20 SURGERY — CARDIOVERSION
Anesthesia: General

## 2017-03-20 MED ORDER — SODIUM CHLORIDE 0.9 % IV SOLN
INTRAVENOUS | Status: DC | PRN
  Administered 2017-03-20: 13:00:00 via INTRAVENOUS

## 2017-03-20 MED ORDER — LIDOCAINE 2% (20 MG/ML) 5 ML SYRINGE
INTRAMUSCULAR | Status: DC | PRN
  Administered 2017-03-20: 100 mg via INTRAVENOUS

## 2017-03-20 MED ORDER — PROPOFOL 10 MG/ML IV BOLUS
INTRAVENOUS | Status: DC | PRN
  Administered 2017-03-20: 20 mg via INTRAVENOUS
  Administered 2017-03-20: 10 mg via INTRAVENOUS
  Administered 2017-03-20: 60 mg via INTRAVENOUS
  Administered 2017-03-20: 10 mg via INTRAVENOUS

## 2017-03-20 NOTE — Anesthesia Postprocedure Evaluation (Signed)
Anesthesia Post Note  Patient: Francisco Reed  Procedure(s) Performed: CARDIOVERSION (N/A )     Patient location during evaluation: PACU Anesthesia Type: General Level of consciousness: awake and alert Pain management: pain level controlled Vital Signs Assessment: post-procedure vital signs reviewed and stable Respiratory status: spontaneous breathing, nonlabored ventilation, respiratory function stable and patient connected to nasal cannula oxygen Cardiovascular status: blood pressure returned to baseline and stable Postop Assessment: no apparent nausea or vomiting Anesthetic complications: no    Last Vitals:  Vitals:   03/20/17 1303 03/20/17 1310  BP: (!) 111/94 (!) 126/96  Pulse: 62 65  Resp: 15 (!) 22  Temp: 36.4 C   SpO2: 100% 97%    Last Pain:  Vitals:   03/20/17 1303  TempSrc: Oral                 Aidynn Polendo EDWARD

## 2017-03-20 NOTE — Anesthesia Preprocedure Evaluation (Addendum)
Anesthesia Evaluation  Patient identified by MRN, date of birth, ID band Patient awake    Reviewed: Allergy & Precautions, H&P , Patient's Chart, lab work & pertinent test results, reviewed documented beta blocker date and time   Airway Mallampati: II  TM Distance: >3 FB Neck ROM: full    Dental no notable dental hx.    Pulmonary sleep apnea ,    Pulmonary exam normal breath sounds clear to auscultation       Cardiovascular  Rhythm:regular Rate:Normal     Neuro/Psych    GI/Hepatic   Endo/Other    Renal/GU      Musculoskeletal   Abdominal   Peds  Hematology   Anesthesia Other Findings   Reproductive/Obstetrics                             Anesthesia Physical Anesthesia Plan  ASA: III  Anesthesia Plan: General   Post-op Pain Management:    Induction: Intravenous  PONV Risk Score and Plan: Treatment may vary due to age or medical condition  Airway Management Planned: Natural Airway and Mask  Additional Equipment:   Intra-op Plan:   Post-operative Plan:   Informed Consent: I have reviewed the patients History and Physical, chart, labs and discussed the procedure including the risks, benefits and alternatives for the proposed anesthesia with the patient or authorized representative who has indicated his/her understanding and acceptance.   Dental Advisory Given  Plan Discussed with: CRNA and Surgeon  Anesthesia Plan Comments: (  )       Anesthesia Quick Evaluation

## 2017-03-20 NOTE — Discharge Instructions (Signed)
Electrical Cardioversion, Care After °This sheet gives you information about how to care for yourself after your procedure. Your health care provider may also give you more specific instructions. If you have problems or questions, contact your health care provider. °What can I expect after the procedure? °After the procedure, it is common to have: °· Some redness on the skin where the shocks were given. ° °Follow these instructions at home: °· Do not drive for 24 hours if you were given a medicine to help you relax (sedative). °· Take over-the-counter and prescription medicines only as told by your health care provider. °· Ask your health care provider how to check your pulse. Check it often. °· Rest for 48 hours after the procedure or as told by your health care provider. °· Avoid or limit your caffeine use as told by your health care provider. °Contact a health care provider if: °· You feel like your heart is beating too quickly or your pulse is not regular. °· You have a serious muscle cramp that does not go away. °Get help right away if: °· You have discomfort in your chest. °· You are dizzy or you feel faint. °· You have trouble breathing or you are short of breath. °· Your speech is slurred. °· You have trouble moving an arm or leg on one side of your body. °· Your fingers or toes turn cold or blue. °This information is not intended to replace advice given to you by your health care provider. Make sure you discuss any questions you have with your health care provider. °Document Released: 11/24/2012 Document Revised: 09/07/2015 Document Reviewed: 08/10/2015 °Elsevier Interactive Patient Education © 2018 [employer] ° °

## 2017-03-20 NOTE — Anesthesia Procedure Notes (Signed)
Procedure Name: General with mask airway Date/Time: 03/20/2017 12:53 PM Performed by: Renato Shin, CRNA Pre-anesthesia Checklist: Patient identified, Emergency Drugs available, Suction available, Patient being monitored and Timeout performed Patient Re-evaluated:Patient Re-evaluated prior to induction Oxygen Delivery Method: Ambu bag Preoxygenation: Pre-oxygenation with 100% oxygen Induction Type: IV induction Ventilation: Mask ventilation without difficulty Placement Confirmation: positive ETCO2,  CO2 detector and breath sounds checked- equal and bilateral Dental Injury: Teeth and Oropharynx as per pre-operative assessment

## 2017-03-20 NOTE — CV Procedure (Signed)
DC Cardioversion  Pt anesthetized by anesthesia with propofol and lidocaine  With pads in AP position attempt a cardioversion made with 200 J synchronized biphasic energy  Not successful  Attempt 2.  200 J synchronized biphasic energy in AP position with applied chest pressure  Unsuccessful.  Attempt 3  New pads placed in apex base position.  200 J synchronized biphasic energy successful in cardioversion  12 lead EKG pending   No complications

## 2017-03-20 NOTE — Interval H&P Note (Signed)
History and Physical Interval Note:  03/20/2017 11:46 AM  Francisco Reed  has presented today for surgery, with the diagnosis of AFIB  The various methods of treatment have been discussed with the patient and family. After consideration of risks, benefits and other options for treatment, the patient has consented to  Procedure(s): CARDIOVERSION (N/A) as a surgical intervention .  The patient's history has been reviewed, patient examined, no change in status, stable for surgery.  I have reviewed the patient's chart and labs.  Questions were answered to the patient's satisfaction.     Dorris Carnes

## 2017-03-20 NOTE — Transfer of Care (Signed)
Immediate Anesthesia Transfer of Care Note  Patient: Francisco Reed  Procedure(s) Performed: CARDIOVERSION (N/A )  Patient Location: Endoscopy Unit  Anesthesia Type:General  Level of Consciousness: awake, alert , oriented and patient cooperative  Airway & Oxygen Therapy: Patient Spontanous Breathing and Patient connected to nasal cannula oxygen  Post-op Assessment: Report given to RN, Post -op Vital signs reviewed and stable and Patient moving all extremities X 4  Post vital signs: Reviewed and stable  Last Vitals:  Vitals:   03/20/17 1202  BP: (!) 130/93  Pulse: (!) 125  Resp: 18  Temp: 36.6 C  SpO2: 98%    Last Pain:  Vitals:   03/20/17 1202  TempSrc: Oral         Complications: No apparent anesthesia complications

## 2017-03-22 ENCOUNTER — Encounter (HOSPITAL_COMMUNITY): Payer: Self-pay | Admitting: Internal Medicine

## 2017-03-23 ENCOUNTER — Ambulatory Visit: Payer: BLUE CROSS/BLUE SHIELD | Admitting: Internal Medicine

## 2017-03-23 ENCOUNTER — Encounter: Payer: Self-pay | Admitting: Internal Medicine

## 2017-03-23 VITALS — BP 122/80 | HR 55 | Ht 70.0 in | Wt 223.0 lb

## 2017-03-23 DIAGNOSIS — I480 Paroxysmal atrial fibrillation: Secondary | ICD-10-CM | POA: Diagnosis not present

## 2017-03-23 NOTE — Progress Notes (Signed)
PCP: Idelle Crouch, MD Primary Cardiologist: Dr Mabeline Caras Primary EP: Dr Rayann Heman  Francisco Reed is a 57 y.o. male who presents today for routine electrophysiology followup.  Since last being seen in our clinic, the patient reports doing reasonably well.  He had afib again recently in the setting of an argument with his spouse.  He has remained in sinus since cardioversion. Today, he denies symptoms of palpitations, chest pain, shortness of breath,  lower extremity edema, dizziness, presyncope, or syncope.  The patient is otherwise without complaint today.   Past Medical History:  Diagnosis Date  . Hyperlipemia    pt denies  . Obstructive sleep apnea    mild  . Persistent atrial fibrillation [facility])    Past Surgical History:  Procedure Laterality Date  . CARDIOVERSION    . CARDIOVERSION N/A 12/24/2016   Procedure: CARDIOVERSION;  Surgeon: Josue Hector, MD;  Location: [facility] ENDOSCOPY;  Service: Cardiovascular;  Laterality: N/A;  . CARDIOVERSION N/A 03/20/2017   Procedure: CARDIOVERSION;  Surgeon: Fay Records, MD;  Location: [facility] ENDOSCOPY;  Service: Cardiovascular;  Laterality: N/A;  . ELECTROPHYSIOLOGIC STUDY N/A 12/28/2014   Procedure: CARDIOVERSION;  Surgeon: Corey Skains, MD;  Location: [facility] ORS;  Service: Cardiovascular;  Laterality: N/A;  . ELECTROPHYSIOLOGIC STUDY N/A 09/07/2015   Procedure: CARDIOVERSION;  Surgeon: Corey Skains, MD;  Location: [facility] ORS;  Service: Cardiovascular;  Laterality: N/A;  . ELECTROPHYSIOLOGIC STUDY N/A 03/18/2016   Procedure: Atrial Fibrillation Ablation;  Surgeon: [name], MD;  Location: [place] CV LAB;  Service: Cardiovascular;  Laterality: N/A;  . TEE WITHOUT CARDIOVERSION N/A 09/07/2015   Procedure: TRANSESOPHAGEAL ECHOCARDIOGRAM (TEE);  Surgeon: Corey Skains, MD;  Location: [facility] ORS;  Service: Cardiovascular;  Laterality: N/A;  . TEE WITHOUT CARDIOVERSION N/A 03/17/2016   Procedure: TRANSESOPHAGEAL ECHOCARDIOGRAM (TEE);  Surgeon:  Lelon Perla, MD;  Location: [facility] ENDOSCOPY;  Service: Cardiovascular;  Laterality: N/A;  . TEE WITHOUT CARDIOVERSION N/A 12/24/2016   Procedure: TRANSESOPHAGEAL ECHOCARDIOGRAM (TEE);  Surgeon: Josue Hector, MD;  Location: [facility] ENDOSCOPY;  Service: Cardiovascular;  Laterality: N/A;    ROS- all systems are reviewed and negatives except as per HPI above  Current Outpatient Medications  Medication Sig Dispense Refill  . acetaminophen (TYLENOL) 500 MG tablet Take 1,000 mg by mouth every 8 (eight) hours as needed for mild pain or moderate pain.    Marland Kitchen apixaban (ELIQUIS) 5 MG TABS tablet Take 1 tablet (5 mg total) 2 (two) times daily by mouth. 60 tablet 3  . ascorbic acid (VITAMIN C) 1000 MG tablet Take 1,000 mg by mouth daily.    . Biotin 10000 MCG TABS Take 1 tablet by mouth daily.     Marland Kitchen buPROPion (WELLBUTRIN SR) 150 MG 12 hr tablet Take 150 mg by mouth daily.    . metoprolol succinate (TOPROL-XL) 100 MG 24 hr tablet Take 1 tablet (100 mg total) daily by mouth. Take with or immediately following a meal. 90 tablet 3   No current facility-administered medications for this visit.     Physical Exam: Vitals:   03/23/17 1121  BP: 122/80  Pulse: (!) 55  Weight: 223 lb (101.2 kg)  Height: 5\' 10"  (1.778 m)    GEN- The patient is well appearing, alert and oriented x 3 today.   Head- normocephalic, atraumatic Eyes-  Sclera clear, conjunctiva pink Ears- hearing intact Oropharynx- clear Lungs- Clear to ausculation bilaterally, normal work of breathing Heart- Regular rate and rhythm, no murmurs, rubs or gallops,  PMI not laterally displaced GI- soft, NT, ND, + BS Extremities- no clubbing, cyanosis, or edema  EKG tracing ordered today is personally reviewed and shows sinus rhythm 55 bpm, PR 136 msec, QRS 102 msec, Qtc 415 msec, nonspecific St/T changes  Assessment and Plan:  1. Persistent atrial fibrillation He has had 2 cardioversions since November. Therapeutic strategies for afib including  medicine and repeat ablation were discussed in detail with the patient today.  At this time, he would prefer to reduce stress, avoid caffeine and avoid ablation.  If he has additional afib, he may be more willing to consider ablation.  Return to see me in 3 months   [name] MD, [facility] 03/23/2017 11:40 AM

## 2017-03-23 NOTE — Patient Instructions (Addendum)
Medication Instructions:  Your physician recommends that you continue on your current medications as directed. Please refer to the Current Medication list given to you today.  Labwork: None ordered.  Testing/Procedures: None ordered.  Follow-Up: Your physician wants you to follow-up in: 3 months with Dr. Allred.      Any Other Special Instructions Will Be Listed Below (If Applicable).  If you need a refill on your cardiac medications before your next appointment, please call your pharmacy.   

## 2017-04-17 DIAGNOSIS — L0889 Other specified local infections of the skin and subcutaneous tissue: Secondary | ICD-10-CM | POA: Diagnosis not present

## 2017-04-17 DIAGNOSIS — I429 Cardiomyopathy, unspecified: Secondary | ICD-10-CM | POA: Diagnosis not present

## 2017-04-17 DIAGNOSIS — R7309 Other abnormal glucose: Secondary | ICD-10-CM | POA: Diagnosis not present

## 2017-04-17 DIAGNOSIS — Z125 Encounter for screening for malignant neoplasm of prostate: Secondary | ICD-10-CM | POA: Diagnosis not present

## 2017-04-17 DIAGNOSIS — I10 Essential (primary) hypertension: Secondary | ICD-10-CM | POA: Diagnosis not present

## 2017-04-17 DIAGNOSIS — E782 Mixed hyperlipidemia: Secondary | ICD-10-CM | POA: Diagnosis not present

## 2017-04-17 DIAGNOSIS — Z79899 Other long term (current) drug therapy: Secondary | ICD-10-CM | POA: Diagnosis not present

## 2017-04-23 DIAGNOSIS — Z1211 Encounter for screening for malignant neoplasm of colon: Secondary | ICD-10-CM | POA: Diagnosis not present

## 2017-05-11 ENCOUNTER — Telehealth: Payer: Self-pay | Admitting: Internal Medicine

## 2017-05-11 DIAGNOSIS — I4891 Unspecified atrial fibrillation: Secondary | ICD-10-CM

## 2017-05-11 DIAGNOSIS — I4819 Other persistent atrial fibrillation: Secondary | ICD-10-CM

## 2017-05-11 NOTE — Telephone Encounter (Signed)
Returned call to Pt.  Pt states he does not feel great.  Pt would like to see Dr. Rayann Heman to rediscuss ablation.  Will have scheduling call.

## 2017-05-11 NOTE — Telephone Encounter (Signed)
Francisco Reed is calling because he is in AFIB and needs to come in to see Dr. Rayann Heman . Please call

## 2017-05-12 NOTE — Telephone Encounter (Signed)
VM left for Pt.  Requested call back if Pt would like to schedule afib ablation.  Left this nurse name and # for call back.

## 2017-05-12 NOTE — Telephone Encounter (Signed)
Call returned to Pt.  Pt would like afib ablation on a Friday.  Scheduled Pt for May 29, 2017 @ 11:30 am.   Will attempt to have labs/instruction letter at [facility] office.  Pt aware he will get a call to schedule cardiac CT.  Will schedule Pt for lab work at [facility] same day as his cardiac CT.  Pt will pick up instruction letter at that time.

## 2017-05-12 NOTE — Telephone Encounter (Signed)
New message:    Pt returning phone call

## 2017-05-13 DIAGNOSIS — K921 Melena: Secondary | ICD-10-CM | POA: Diagnosis not present

## 2017-05-14 NOTE — Telephone Encounter (Signed)
Call placed to Pt.  Notified Pt would be mailing his instructions for cardiac CT and ablation.  Advised Pt to come to [facility] office after cardiac CT to get blood work prior to ablation.   Mailing letter.

## 2017-05-19 ENCOUNTER — Other Ambulatory Visit: Payer: Self-pay

## 2017-05-19 ENCOUNTER — Emergency Department: Payer: BLUE CROSS/BLUE SHIELD

## 2017-05-19 ENCOUNTER — Emergency Department
Admission: EM | Admit: 2017-05-19 | Discharge: 2017-05-19 | Disposition: A | Discharge status: Home / Self Care | Payer: BLUE CROSS/BLUE SHIELD | Attending: Emergency Medicine | Admitting: Emergency Medicine

## 2017-05-19 ENCOUNTER — Telehealth: Payer: Self-pay | Admitting: Internal Medicine

## 2017-05-19 DIAGNOSIS — R079 Chest pain, unspecified: Secondary | ICD-10-CM

## 2017-05-19 DIAGNOSIS — I480 Paroxysmal atrial fibrillation: Secondary | ICD-10-CM | POA: Diagnosis not present

## 2017-05-19 DIAGNOSIS — Z7901 Long term (current) use of anticoagulants: Secondary | ICD-10-CM | POA: Insufficient documentation

## 2017-05-19 DIAGNOSIS — I4891 Unspecified atrial fibrillation: Secondary | ICD-10-CM

## 2017-05-19 DIAGNOSIS — R0602 Shortness of breath: Secondary | ICD-10-CM | POA: Diagnosis not present

## 2017-05-19 DIAGNOSIS — Z79899 Other long term (current) drug therapy: Secondary | ICD-10-CM | POA: Insufficient documentation

## 2017-05-19 DIAGNOSIS — R200 Anesthesia of skin: Secondary | ICD-10-CM | POA: Diagnosis not present

## 2017-05-19 DIAGNOSIS — R0789 Other chest pain: Secondary | ICD-10-CM | POA: Diagnosis not present

## 2017-05-19 LAB — CBC
HEMATOCRIT: 42.4 % (ref 40.0–52.0)
HEMOGLOBIN: 14.1 g/dL (ref 13.0–18.0)
MCH: 31 pg (ref 26.0–34.0)
MCHC: 33.2 g/dL (ref 32.0–36.0)
MCV: 93.2 fL (ref 80.0–100.0)
Platelets: 177 10*3/uL (ref 150–440)
RBC: 4.55 MIL/uL (ref 4.40–5.90)
RDW: 13.8 % (ref 11.5–14.5)
WBC: 5.5 10*3/uL (ref 3.8–10.6)

## 2017-05-19 LAB — COMPREHENSIVE METABOLIC PANEL
ALBUMIN: 4.1 g/dL (ref 3.5–5.0)
ALK PHOS: 73 U/L (ref 38–126)
ALT: 105 U/L — ABNORMAL HIGH (ref 17–63)
AST: 72 U/L — ABNORMAL HIGH (ref 15–41)
Anion gap: 6 (ref 5–15)
BILIRUBIN TOTAL: 0.4 mg/dL (ref 0.3–1.2)
BUN: 17 mg/dL (ref 6–20)
CO2: 27 mmol/L (ref 22–32)
Calcium: 9 mg/dL (ref 8.9–10.3)
Chloride: 107 mmol/L (ref 101–111)
Creatinine, Ser: 1.14 mg/dL (ref 0.61–1.24)
GFR calc Af Amer: >60 mL/min (ref >60)
GFR calc non Af Amer: >60 mL/min (ref >60)
GLUCOSE: 113 mg/dL — AB (ref 65–99)
POTASSIUM: 4.4 mmol/L (ref 3.5–5.1)
Sodium: 140 mmol/L (ref 135–145)
TOTAL PROTEIN: 7 g/dL (ref 6.5–8.1)

## 2017-05-19 LAB — TROPONIN I: Troponin I: <0.03 ng/mL (ref <0.03)

## 2017-05-19 MED ORDER — METOPROLOL TARTRATE 5 MG/5ML IV SOLN
5.0000 mg | Freq: Once | INTRAVENOUS | Status: AC
  Administered 2017-05-19: 5 mg via INTRAVENOUS
  Filled 2017-05-19: qty 5

## 2017-05-19 MED ORDER — SODIUM CHLORIDE 0.9 % IV BOLUS
1000.0000 mL | Freq: Once | INTRAVENOUS | Status: AC
  Administered 2017-05-19: 1000 mL via INTRAVENOUS

## 2017-05-19 NOTE — ED Provider Notes (Signed)
[facility] Emergency Department Provider Note  Time seen: 4:44 PM  I have reviewed the triage vital signs and the nursing notes.   HISTORY  Chief Complaint Chest Pain    HPI Francisco Reed is a 57 y.o. male with a past medical history of hyperlipidemia, paroxysmal atrial fibrillation on Eliquis, presents to the emergency department for left-sided chest pain and rapid heart rate.  According to the patient he was having left-sided chest discomfort beginning approximately 2 hours prior to arrival.  Checked his heart rate at home and it was reading greater than 200, weighted and checked it again and continue to read greater than 200 so he came to the emergency department for evaluation.  Patient states a history of paroxysmal atrial fibrillation however he has been in atrial fibrillation since 3/23.  Patient is on metoprolol 100 mg daily for rate control but states it is not been working.  States they have tried other medications for rate control that have not worked either.  Patient has an ablation scheduled for 05/29/17, but due to the shortness of breath chest discomfort and rapid heart rate today he came to the emergency department.  Currently the patient denies any chest discomfort but continues to feel mildly short of breath.  Heart rate currently between 140 160 bpm in atrial fibrillation.  Denies any nausea or diaphoresis.  Largely negative review of systems otherwise.   Past Medical History:  Diagnosis Date  . Hyperlipemia    pt denies  . Obstructive sleep apnea    mild  . Persistent atrial fibrillation [facility])     Patient Active Problem List   Diagnosis Date Noted  . Persistent atrial fibrillation ([place]) 03/18/2016  . A-fib ([place]) 03/18/2016  . Bilateral leg edema 09/19/2015  . Bradycardia 03/23/2014  . Paroxysmal atrial fibrillation ([place]) 01/27/2014  . Obstructive sleep apnea 10/13/2013  . Cardiomyopathy ([place]) 07/22/2013  . Gastro-esophageal reflux  disease without esophagitis 07/22/2013  . Mixed hyperlipidemia 07/22/2013    Past Surgical History:  Procedure Laterality Date  . CARDIOVERSION    . CARDIOVERSION N/A 12/24/2016   Procedure: CARDIOVERSION;  Surgeon: Josue Hector, MD;  Location: [facility] ENDOSCOPY;  Service: Cardiovascular;  Laterality: N/A;  . CARDIOVERSION N/A 03/20/2017   Procedure: CARDIOVERSION;  Surgeon: Fay Records, MD;  Location: [facility] ENDOSCOPY;  Service: Cardiovascular;  Laterality: N/A;  . ELECTROPHYSIOLOGIC STUDY N/A 12/28/2014   Procedure: CARDIOVERSION;  Surgeon: Corey Skains, MD;  Location: [facility] ORS;  Service: Cardiovascular;  Laterality: N/A;  . ELECTROPHYSIOLOGIC STUDY N/A 09/07/2015   Procedure: CARDIOVERSION;  Surgeon: Corey Skains, MD;  Location: [facility] ORS;  Service: Cardiovascular;  Laterality: N/A;  . ELECTROPHYSIOLOGIC STUDY N/A 03/18/2016   Procedure: Atrial Fibrillation Ablation;  Surgeon: [name], MD;  Location: [place] CV LAB;  Service: Cardiovascular;  Laterality: N/A;  . TEE WITHOUT CARDIOVERSION N/A 09/07/2015   Procedure: TRANSESOPHAGEAL ECHOCARDIOGRAM (TEE);  Surgeon: Corey Skains, MD;  Location: [facility] ORS;  Service: Cardiovascular;  Laterality: N/A;  . TEE WITHOUT CARDIOVERSION N/A 03/17/2016   Procedure: TRANSESOPHAGEAL ECHOCARDIOGRAM (TEE);  Surgeon: Lelon Perla, MD;  Location: [facility] ENDOSCOPY;  Service: Cardiovascular;  Laterality: N/A;  . TEE WITHOUT CARDIOVERSION N/A 12/24/2016   Procedure: TRANSESOPHAGEAL ECHOCARDIOGRAM (TEE);  Surgeon: Josue Hector, MD;  Location: [facility] ENDOSCOPY;  Service: Cardiovascular;  Laterality: N/A;    Prior to Admission medications   Medication Sig Start Date End Date Taking? Authorizing Provider  acetaminophen (TYLENOL) 500 MG tablet Take 1,000 mg  by mouth every 8 (eight) hours as needed for mild pain or moderate pain.    [provider]  apixaban (ELIQUIS) 5 MG TABS tablet Take 1 tablet (5 mg total) 2 (two) times daily by mouth. 12/22/16    Allred, Jeneen Rinks, MD  ascorbic acid (VITAMIN C) 1000 MG tablet Take 1,000 mg by mouth daily.    [provider]  Biotin 10000 MCG TABS Take 1 tablet by mouth daily.     [provider]  buPROPion (WELLBUTRIN SR) 150 MG 12 hr tablet Take 150 mg by mouth daily.    [provider]  metoprolol succinate (TOPROL-XL) 100 MG 24 hr tablet Take 1 tablet (100 mg total) daily by mouth. Take with or immediately following a meal. 12/22/16   [name], MD    Allergies  Allergen Reactions  . No Known Allergies     No family history on file.  Social History Social History   Tobacco Use  . Smoking status: Never Smoker  . Smokeless tobacco: Never Used  Substance Use Topics  . Alcohol use: No  . Drug use: No    Review of Systems Constitutional: Negative for fever. Eyes: Negative for visual complaints ENT: Negative for recent illness/congestion Cardiovascular: Left-sided chest pain, now resolved Respiratory: Mild shortness of breath Gastrointestinal: Negative for abdominal pain, vomiting and diarrhea. Genitourinary: Negative for urinary compaints  Musculoskeletal: Negative for leg pain or swelling Skin: Negative for skin complaints  Neurological: Negative for headache All other ROS negative  ____________________________________________   PHYSICAL EXAM:  VITAL SIGNS: ED Triage Vitals  Enc Vitals Group     BP 05/19/17 1615 104/75     Pulse Rate 05/19/17 1615 (!) 59     Resp 05/19/17 1615 16     Temp 05/19/17 1615 (!) 97.4 F (36.3 C)     Temp Source 05/19/17 1615 Oral     SpO2 05/19/17 1615 97 %     Weight 05/19/17 1622 223 lb (101.2 kg)     Height 05/19/17 1622 5\' 10"  (1.778 m)     Head Circumference --      Peak Flow --      Pain Score 05/19/17 1622 6     Pain Loc --      Pain Edu? --      Excl. in [place]? --    Constitutional: Alert and oriented. Well appearing and in no distress. Eyes: Normal exam ENT   Head: Normocephalic and atraumatic.    Mouth/Throat: Mucous membranes are moist. Cardiovascular: Irregular rhythm, rate around 150 bpm.  No obvious murmur. Respiratory: Normal respiratory effort without tachypnea nor retractions. Breath sounds are clear  Gastrointestinal: Soft and nontender. No distention.  Musculoskeletal: Nontender with normal range of motion in all extremities. No lower extremity tenderness or edema. Neurologic:  Normal speech and language. No gross focal neurologic deficits Skin:  Skin is warm, dry and intact.  Psychiatric: Mood and affect are normal. Speech and behavior are normal.   ____________________________________________    EKG  EKG reviewed and interpreted by myself shows atrial fibrillation with rapid ventricular response at 143 bpm, narrow QRS, normal axis, otherwise normal intervals with nonspecific ST changes.  No ST elevation.  ____________________________________________    RADIOLOGY  Chest x-ray shows cardiomegaly, no acute disease.  ____________________________________________   INITIAL IMPRESSION / ASSESSMENT AND PLAN / ED COURSE  Pertinent labs & imaging results that were available during my care of the patient were reviewed by me and considered in my medical  decision making (see chart for details).  Patient presents to the emergency department with left-sided chest discomfort, atrial fibrillation with rapid ventricular response.  Differential would include ACS, demand ischemia, atrial fibrillation with rapid ventricular response, pneumonia or pneumothorax.  We will check labs including cardiac enzymes, chest x-ray, IV hydrate and treat with 5 mg of IV metoprolol.  EKG is reassuring otherwise.  Patient's labs are resulted largely at baseline.  Slight LFT elevation, no abdominal pain.  Patient continues to be in atrial fibrillation rapid ventricular response at 140 bpm.  Despite metoprolol, there is been little change in the heart rate.  We will discuss with cardiology for further  recommendations.  Discussed the patient with cardiology Dr.End.  He recommends administering additional metoprolol IV and then starting metoprolol 50 mg every 6 hours until his heart rate is controlled, they will consult in the morning.  I discussed the plan of care with the patient to admit to the hospital for cardiology consultation to continue to work on his heart rate.  Patient adamantly refuses admission to the hospital.  States his heart rate has been this higher for greater than 1 week and he does not want to miss work and wants to go home.  He states they have never been able to bring the heart rate down with medications, only with cardioversion.  Patient's labs are reassuringly normal including a negative troponin.  Contrary to recorded heart rates/vitals, patient's heart rate has remained between 120 150 bpm atrial fibrillation with rapid ventricular response throughout his stay in the emergency department.  Otherwise patient is stable, normal blood pressure, no distress, lying in bed watching TV.  We will dose an additional 5 mg of IV metoprolol to see if we can bring the patient's heart rate down, he states regardless he wishes to go home and will follow up with cardiology tomorrow.  Minimal to no change after additional 5 mg of metoprolol.  Once again discussed recommendation to admit to the hospital for continued treatment.  Patient wants to go home.  States he will call his cardiologist in the morning.  I discussed with the patient return precautions for any chest pain or shortness of breath.  Patient agreeable.  Wife is also attempting to convince the patient to say but unsuccessfully.  ____________________________________________   FINAL CLINICAL IMPRESSION(S) / ED DIAGNOSES  Atrial fibrillation with rapid ventricular response chest pain    Harvest Dark, MD 05/19/17 Karl B[place]

## 2017-05-19 NOTE — Telephone Encounter (Signed)
New Message    STAT if HR is under 50 or over 120 (normal HR is 60-100 beats per minute)  1) What is your heart rate? 200  2) Do you have a log of your heart rate readings (document readings)? Not available   3) Do you have any other symptoms? Patient states that he has been in afib since Saturday.

## 2017-05-19 NOTE — Telephone Encounter (Signed)
Spoke to patient who is concerned about his HR (Afib) fluctuating between 30-200 over the past week and a half.Marland Kitchen  He is getting rather upset with this, because he is trying to work and is feeling the sensation in his chest.. He is scheduled for an ablation 4/12   I suggested the Afib clinic today, but they close at 3:30..  We decided that the ED in [place] is the most appropriate treatment at this particular time.Marland Kitchen

## 2017-05-19 NOTE — ED Notes (Signed)
Pt ambulatory to toilet

## 2017-05-19 NOTE — ED Notes (Signed)
Pt states he takes metroprolol for HR at home. Pt states L arm numbness since having BP cuff squeeze it, states still feels numb. Pt took extra doses of metoprolol this week x 3 days d/t HR being increased and being in a fib. Pt alert, oriented, states he feels weak. L sided CP. States his chest feels tight.

## 2017-05-19 NOTE — Discharge Instructions (Addendum)
He has been seen in the emergency department for a fast heart rate, and chest pain.  Your workup has shown atrial fibrillation with rapid rate, but otherwise normal workup.  We have recommended admission to the hospital for further treatment we have elected to be discharged home.  As we discussed please call your cardiologist tomorrow morning to discuss further options and your continued rapid heart rate.  If you have any further chest pain or trouble breathing, or feel lightheaded/dizzy, I would urge you to come back to the emergency department for further evaluation and possible admission to the hospital.

## 2017-05-19 NOTE — ED Notes (Signed)
Pt is stating that he wants to go home, states "I'm making everyone mad." when asked why he states that the doctor wants him to stay and his wife wants him to stay but he wants to go home. Pt informed that staying would be a good idea to be monitored. Pt appears irritated but is still pleasant. Pt talking in calm, cooperative tone.

## 2017-05-19 NOTE — ED Triage Notes (Signed)
PT reports chest pain starting today. Pt reports he has been in Afib since 3/23. Pt monitors HR at home and repots HR was in the 200s at home and started having left sided chest pain. Dizziness and lightheaded reported while at home. SOB upon exertion.   Pt has been in SVT in the past. Pt also scheduled for an ablation.

## 2017-05-20 ENCOUNTER — Telehealth: Payer: Self-pay | Admitting: Internal Medicine

## 2017-05-20 MED ORDER — DILTIAZEM HCL 30 MG PO TABS: 30.0000 mg | ORAL_TABLET | ORAL | 0 refills | Status: DC | PRN

## 2017-05-20 NOTE — Telephone Encounter (Signed)
Call placed to Pt.  Notified per Dr. Rayann Heman, he can take cardizem 30 mg one tablet as needed every 4 hours for heart rate greater than 100. Pt asked if he should also take the Toprol XL bid.  Asked him to try.

## 2017-05-20 NOTE — Telephone Encounter (Signed)
Call returned to Pt. Notified unable to change his ablation time.  Spoke with APP, ok to increase his Toprol XL 100 mg to BID.   Pt indicates understanding.

## 2017-05-20 NOTE — Telephone Encounter (Signed)
Pt states that yesterday his heart rate was up to 200 beats/minute and he was having chest pain. Pt did go to the ER [place]. This morning his HR continues to be 140 to 150 beats/minute which it has been since he has been in A-fib. Pt denies chest pain nor SOB today. Pt is scheduled for ablation on 4/12, he would like to have it done on an earlier date. Pt is aware that this message will be send to MD's nurse for recommendations.

## 2017-05-20 NOTE — Telephone Encounter (Signed)
New message    Patient calling to request earlier procedure date. Patient seen 05/19/17 [facility] [place]

## 2017-05-22 ENCOUNTER — Other Ambulatory Visit: Payer: Self-pay | Admitting: Internal Medicine

## 2017-05-22 NOTE — Telephone Encounter (Signed)
Pt is a 57 yr old male who saw Dr Rayann Heman on 03/23/17. Wt was  101.2 Kg,. SCr from 05/19/17 was 1.14. Will refill Eilquis 5mg  BID.

## 2017-05-25 ENCOUNTER — Telehealth: Payer: Self-pay | Admitting: Internal Medicine

## 2017-05-25 NOTE — Telephone Encounter (Signed)
Received call transferred directly from operator and spoke with pt. He reports about 90 minutes ago when he was at lunch he became very weak, short of breath and felt like he was going to pass out.  He called his wife who is currently with him. Pt reports at current time he is lying in the seat of his car. He cannot walk without being short of breath. Feels very weak. States he has been dealing with this for 2 weeks but it is worse today.  Pt is asking to see Dr. Rayann Heman today.  Pt does not know heart rate but states it is fast and all over the place.  I advised pt he needed to be seen in ED for evaluation of his symptoms.  Pt stated "OK, but  I am probably not going to go."

## 2017-05-25 NOTE — Telephone Encounter (Signed)
Returned call to Pt.  Pt in his truck with his wife.  Per Pt he took the PRN cardizem about 1.5 hours before his lunch.  Then he ate some of his lunch but didn't feel great and threw the rest out.  Pt states he is sob with any activity and dizzy if he bends down to tie his shoes and stands back up.  Asked if Pt has been avoiding caffeine.  Per Pt he has had 2 cokes today.  Advised Pt to avoid all caffeine until his procedure.  Advised Pt to relax today.  Advised if he continued to feel bad and developed chest pain he should go to the ER.

## 2017-05-25 NOTE — Telephone Encounter (Signed)
New Message    Pt c/o Shortness Of Breath: STAT if SOB developed within the last 24 hours or pt is noticeably SOB on the phone  1. Are you currently SOB (can you hear that pt is SOB on the phone)?  Yes - sounds like he may be struggling to get breath  2. How long have you been experiencing SOB? Off and on for 3 weeks   3. Are you SOB when sitting or when up moving around?  Just moving around   4. Are you currently experiencing any other symptoms?  Cold and clammy , light headed , nausea

## 2017-05-26 ENCOUNTER — Telehealth: Payer: Self-pay

## 2017-05-26 NOTE — Telephone Encounter (Signed)
Outreach made to Pt.  Per Pt he is feeling a little better today.  States only had one caffeine drink with lunch.  Pt with cardiac CT and lab work tomorrow.  No further action needed at this time.

## 2017-05-27 ENCOUNTER — Telehealth: Payer: Self-pay | Admitting: Internal Medicine

## 2017-05-27 ENCOUNTER — Ambulatory Visit (HOSPITAL_COMMUNITY): Payer: BLUE CROSS/BLUE SHIELD

## 2017-05-27 ENCOUNTER — Other Ambulatory Visit: Payer: BLUE CROSS/BLUE SHIELD

## 2017-05-27 ENCOUNTER — Ambulatory Visit (HOSPITAL_COMMUNITY): Admission: RE | Admit: 2017-05-27 | Payer: BLUE CROSS/BLUE SHIELD | Source: Ambulatory Visit

## 2017-05-27 NOTE — Telephone Encounter (Signed)
New message    Patient calling to confirm he needs CT scan prior to procedure on 05/29/17, does procedure need to be rescheduled  Patient was scheduled for CT today, radiology called to let him know the scanner was down, unable to get CT today.

## 2017-05-27 NOTE — Telephone Encounter (Signed)
Call placed to Pt.  Notified Pt that because unable to obtain cardiac CT d/t machine issue, will have Pt come in early on Friday for his ablation and do TEE prior to procedure.  Pt to arrive at 7:30 am for TEE prior to ablation.  Reiterated nothing to eat or drink after midnight.  Hold medications morning of procedure. Pt indicates understanding.

## 2017-05-28 NOTE — Anesthesia Preprocedure Evaluation (Addendum)
Anesthesia Evaluation  Patient identified by MRN, date of birth, ID band Patient awake    Reviewed: Allergy & Precautions, H&P , NPO status , Patient's Chart, lab work & pertinent test results  Airway Mallampati: II  TM Distance: >3 FB Neck ROM: Full    Dental no notable dental hx. (+) Teeth Intact, Dental Advisory Given   Pulmonary sleep apnea ,    Pulmonary exam normal breath sounds clear to auscultation       Cardiovascular Exercise Tolerance: Good hypertension, Pt. on medications and Pt. on home beta blockers + dysrhythmias Atrial Fibrillation  Rhythm:Irregular Rate:Tachycardia     Neuro/Psych negative neurological ROS  negative psych ROS   GI/Hepatic Neg liver ROS, GERD  Controlled,  Endo/Other  negative endocrine ROS  Renal/GU negative Renal ROS  negative genitourinary   Musculoskeletal   Abdominal   Peds  Hematology negative hematology ROS (+)   Anesthesia Other Findings   Reproductive/Obstetrics negative OB ROS                            Anesthesia Physical Anesthesia Plan  ASA: III  Anesthesia Plan: MAC   Post-op Pain Management:    Induction: Intravenous  PONV Risk Score and Plan: 1 and Propofol infusion  Airway Management Planned: Nasal Cannula  Additional Equipment:   Intra-op Plan:   Post-operative Plan:   Informed Consent: I have reviewed the patients History and Physical, chart, labs and discussed the procedure including the risks, benefits and alternatives for the proposed anesthesia with the patient or authorized representative who has indicated his/her understanding and acceptance.   Dental advisory given  Plan Discussed with: CRNA  Anesthesia Plan Comments:         Anesthesia Quick Evaluation

## 2017-05-29 ENCOUNTER — Ambulatory Visit (HOSPITAL_BASED_OUTPATIENT_CLINIC_OR_DEPARTMENT_OTHER)
Admission: RE | Admit: 2017-05-29 | Discharge: 2017-05-29 | Disposition: A | Discharge status: Home / Self Care | Payer: BLUE CROSS/BLUE SHIELD | Source: Ambulatory Visit | Attending: Cardiology | Admitting: Cardiology

## 2017-05-29 ENCOUNTER — Ambulatory Visit (HOSPITAL_COMMUNITY)
Admission: RE | Disposition: A | Discharge status: Home / Self Care | Payer: BLUE CROSS/BLUE SHIELD | Source: Ambulatory Visit | Attending: Internal Medicine

## 2017-05-29 ENCOUNTER — Ambulatory Visit (HOSPITAL_COMMUNITY): Payer: BLUE CROSS/BLUE SHIELD | Admitting: Anesthesiology

## 2017-05-29 ENCOUNTER — Encounter (HOSPITAL_COMMUNITY): Payer: Self-pay | Admitting: *Deleted

## 2017-05-29 ENCOUNTER — Ambulatory Visit (HOSPITAL_COMMUNITY)
Admission: RE | Admit: 2017-05-29 | Discharge: 2017-05-30 | Disposition: A | Discharge status: Home / Self Care | Payer: BLUE CROSS/BLUE SHIELD | Source: Ambulatory Visit | Attending: Internal Medicine | Admitting: Internal Medicine

## 2017-05-29 ENCOUNTER — Ambulatory Visit (HOSPITAL_COMMUNITY): Payer: BLUE CROSS/BLUE SHIELD | Admitting: Certified Registered Nurse Anesthetist

## 2017-05-29 ENCOUNTER — Encounter (HOSPITAL_COMMUNITY)
Admission: RE | Disposition: A | Discharge status: Home / Self Care | Payer: Self-pay | Source: Ambulatory Visit | Attending: Internal Medicine

## 2017-05-29 DIAGNOSIS — I10 Essential (primary) hypertension: Secondary | ICD-10-CM | POA: Insufficient documentation

## 2017-05-29 DIAGNOSIS — Z7901 Long term (current) use of anticoagulants: Secondary | ICD-10-CM | POA: Insufficient documentation

## 2017-05-29 DIAGNOSIS — I081 Rheumatic disorders of both mitral and tricuspid valves: Secondary | ICD-10-CM | POA: Diagnosis not present

## 2017-05-29 DIAGNOSIS — K219 Gastro-esophageal reflux disease without esophagitis: Secondary | ICD-10-CM | POA: Insufficient documentation

## 2017-05-29 DIAGNOSIS — I5082 Biventricular heart failure: Secondary | ICD-10-CM | POA: Insufficient documentation

## 2017-05-29 DIAGNOSIS — I4819 Other persistent atrial fibrillation: Secondary | ICD-10-CM | POA: Diagnosis present

## 2017-05-29 DIAGNOSIS — G4733 Obstructive sleep apnea (adult) (pediatric): Secondary | ICD-10-CM | POA: Insufficient documentation

## 2017-05-29 DIAGNOSIS — I481 Persistent atrial fibrillation: Secondary | ICD-10-CM | POA: Insufficient documentation

## 2017-05-29 DIAGNOSIS — E785 Hyperlipidemia, unspecified: Secondary | ICD-10-CM | POA: Diagnosis not present

## 2017-05-29 DIAGNOSIS — I4891 Unspecified atrial fibrillation: Secondary | ICD-10-CM | POA: Diagnosis not present

## 2017-05-29 DIAGNOSIS — I340 Nonrheumatic mitral (valve) insufficiency: Secondary | ICD-10-CM

## 2017-05-29 DIAGNOSIS — I480 Paroxysmal atrial fibrillation: Secondary | ICD-10-CM | POA: Diagnosis not present

## 2017-05-29 DIAGNOSIS — I429 Cardiomyopathy, unspecified: Secondary | ICD-10-CM | POA: Diagnosis not present

## 2017-05-29 HISTORY — PX: ATRIAL FIBRILLATION ABLATION: EP1191

## 2017-05-29 HISTORY — PX: TEE WITHOUT CARDIOVERSION: SHX5443

## 2017-05-29 HISTORY — DX: Heart disease, unspecified: I51.9

## 2017-05-29 LAB — POCT ACTIVATED CLOTTING TIME
ACTIVATED CLOTTING TIME: 235 s
Activated Clotting Time: 279 seconds
Activated Clotting Time: 175 seconds

## 2017-05-29 SURGERY — ECHOCARDIOGRAM, TRANSESOPHAGEAL
Anesthesia: Monitor Anesthesia Care

## 2017-05-29 SURGERY — ATRIAL FIBRILLATION ABLATION
Anesthesia: Monitor Anesthesia Care

## 2017-05-29 MED ORDER — MENTHOL 3 MG MT LOZG
1.0000 | LOZENGE | OROMUCOSAL | Status: DC | PRN
  Administered 2017-05-30: 3 mg via ORAL
  Filled 2017-05-29: qty 9

## 2017-05-29 MED ORDER — LIDOCAINE 2% (20 MG/ML) 5 ML SYRINGE
INTRAMUSCULAR | Status: DC | PRN
  Administered 2017-05-29: 50 mg via INTRAVENOUS

## 2017-05-29 MED ORDER — HEPARIN (PORCINE) IN NACL 2-0.9 UNIT/ML-% IJ SOLN
INTRAMUSCULAR | Status: AC
  Filled 2017-05-29: qty 500

## 2017-05-29 MED ORDER — HEPARIN (PORCINE) IN NACL 2-0.9 UNIT/ML-% IJ SOLN
INTRAMUSCULAR | Status: AC | PRN
  Administered 2017-05-29: 500 mL

## 2017-05-29 MED ORDER — FENTANYL CITRATE (PF) 100 MCG/2ML IJ SOLN
INTRAMUSCULAR | Status: DC | PRN
  Administered 2017-05-29: 100 ug via INTRAVENOUS

## 2017-05-29 MED ORDER — SODIUM CHLORIDE 0.9% FLUSH
3.0000 mL | Freq: Two times a day (BID) | INTRAVENOUS | Status: DC
  Administered 2017-05-29: 3 mL via INTRAVENOUS

## 2017-05-29 MED ORDER — PROPOFOL 10 MG/ML IV BOLUS
INTRAVENOUS | Status: DC | PRN
  Administered 2017-05-29: 160 mg via INTRAVENOUS

## 2017-05-29 MED ORDER — ADENOSINE 6 MG/2ML IV SOLN
INTRAVENOUS | Status: DC | PRN
  Administered 2017-05-29 (×2): 12 mg via INTRAVENOUS

## 2017-05-29 MED ORDER — PHENYLEPHRINE 40 MCG/ML (10ML) SYRINGE FOR IV PUSH (FOR BLOOD PRESSURE SUPPORT)
PREFILLED_SYRINGE | INTRAVENOUS | Status: DC | PRN
  Administered 2017-05-29: 160 ug via INTRAVENOUS
  Administered 2017-05-29: 80 ug via INTRAVENOUS

## 2017-05-29 MED ORDER — DEXAMETHASONE SODIUM PHOSPHATE 10 MG/ML IJ SOLN
INTRAMUSCULAR | Status: DC | PRN
  Administered 2017-05-29: 10 mg via INTRAVENOUS

## 2017-05-29 MED ORDER — ROCURONIUM BROMIDE 50 MG/5ML IV SOSY
PREFILLED_SYRINGE | INTRAVENOUS | Status: DC | PRN
  Administered 2017-05-29: 50 mg via INTRAVENOUS
  Administered 2017-05-29: 20 mg via INTRAVENOUS

## 2017-05-29 MED ORDER — HEPARIN SODIUM (PORCINE) 1000 UNIT/ML IJ SOLN
INTRAMUSCULAR | Status: DC | PRN
  Administered 2017-05-29: 5000 [IU] via INTRAVENOUS

## 2017-05-29 MED ORDER — ACETAMINOPHEN 325 MG PO TABS
650.0000 mg | ORAL_TABLET | ORAL | Status: DC | PRN
  Administered 2017-05-29: 650 mg via ORAL
  Filled 2017-05-29: qty 2

## 2017-05-29 MED ORDER — IOPAMIDOL (ISOVUE-370) INJECTION 76%
INTRAVENOUS | Status: AC
  Filled 2017-05-29: qty 50

## 2017-05-29 MED ORDER — PROPOFOL 500 MG/50ML IV EMUL
INTRAVENOUS | Status: DC | PRN
  Administered 2017-05-29: 100 ug/kg/min via INTRAVENOUS

## 2017-05-29 MED ORDER — SODIUM CHLORIDE 0.9 % IV SOLN
INTRAVENOUS | Status: DC
  Administered 2017-05-29 (×2): via INTRAVENOUS

## 2017-05-29 MED ORDER — METOPROLOL SUCCINATE ER 50 MG PO TB24
50.0000 mg | ORAL_TABLET | Freq: Every day | ORAL | Status: DC
  Administered 2017-05-30: 50 mg via ORAL
  Filled 2017-05-29: qty 1

## 2017-05-29 MED ORDER — PHENYLEPHRINE HCL 10 MG/ML IJ SOLN
INTRAVENOUS | Status: DC | PRN
  Administered 2017-05-29: 50 ug/min via INTRAVENOUS

## 2017-05-29 MED ORDER — ONDANSETRON HCL 4 MG/2ML IJ SOLN: 4.0000 mg | Freq: Four times a day (QID) | INTRAMUSCULAR | Status: DC | PRN

## 2017-05-29 MED ORDER — SODIUM CHLORIDE 0.9 % IV SOLN
INTRAVENOUS | Status: DC
  Administered 2017-05-29: 08:00:00 via INTRAVENOUS

## 2017-05-29 MED ORDER — HEPARIN SODIUM (PORCINE) 1000 UNIT/ML IJ SOLN
INTRAMUSCULAR | Status: AC
  Filled 2017-05-29: qty 1

## 2017-05-29 MED ORDER — HEPARIN SODIUM (PORCINE) 1000 UNIT/ML IJ SOLN
INTRAMUSCULAR | Status: DC | PRN
  Administered 2017-05-29: 1000 [IU] via INTRAVENOUS
  Administered 2017-05-29: 12000 [IU] via INTRAVENOUS

## 2017-05-29 MED ORDER — HYDROCODONE-ACETAMINOPHEN 5-325 MG PO TABS
1.0000 | ORAL_TABLET | ORAL | Status: DC | PRN
  Administered 2017-05-29: 23:00:00 1 via ORAL
  Filled 2017-05-29: qty 1

## 2017-05-29 MED ORDER — GUAIFENESIN-DM 100-10 MG/5ML PO SYRP
5.0000 mL | ORAL_SOLUTION | ORAL | Status: DC | PRN
  Administered 2017-05-29 (×2): 5 mL via ORAL
  Filled 2017-05-29 (×2): qty 5

## 2017-05-29 MED ORDER — PROPOFOL 10 MG/ML IV BOLUS
INTRAVENOUS | Status: DC | PRN
Start: 2017-05-29 — End: 2017-05-29
  Administered 2017-05-29: 20 mg via INTRAVENOUS
  Administered 2017-05-29: 40 mg via INTRAVENOUS

## 2017-05-29 MED ORDER — MIDAZOLAM HCL 2 MG/2ML IJ SOLN
INTRAMUSCULAR | Status: DC | PRN
  Administered 2017-05-29: 2 mg via INTRAVENOUS

## 2017-05-29 MED ORDER — SODIUM CHLORIDE 0.9% FLUSH: 3.0000 mL | INTRAVENOUS | Status: DC | PRN

## 2017-05-29 MED ORDER — BUPIVACAINE HCL (PF) 0.25 % IJ SOLN
INTRAMUSCULAR | Status: DC | PRN
  Administered 2017-05-29: 30 mL

## 2017-05-29 MED ORDER — SODIUM CHLORIDE 0.9 % IV SOLN: 250.0000 mL | INTRAVENOUS | Status: DC | PRN

## 2017-05-29 MED ORDER — BUPROPION HCL ER (XL) 150 MG PO TB24
150.0000 mg | ORAL_TABLET | Freq: Every day | ORAL | Status: DC
  Administered 2017-05-30: 09:00:00 150 mg via ORAL
  Filled 2017-05-29: qty 1

## 2017-05-29 MED ORDER — ONDANSETRON HCL 4 MG/2ML IJ SOLN
INTRAMUSCULAR | Status: DC | PRN
  Administered 2017-05-29: 4 mg via INTRAVENOUS

## 2017-05-29 MED ORDER — PROTAMINE SULFATE 10 MG/ML IV SOLN
INTRAVENOUS | Status: DC | PRN
  Administered 2017-05-29: 10 mg via INTRAVENOUS
  Administered 2017-05-29: 20 mg via INTRAVENOUS

## 2017-05-29 MED ORDER — BUPIVACAINE HCL (PF) 0.25 % IJ SOLN
INTRAMUSCULAR | Status: AC
  Filled 2017-05-29: qty 30

## 2017-05-29 MED ORDER — SUGAMMADEX SODIUM 200 MG/2ML IV SOLN
INTRAVENOUS | Status: DC | PRN
  Administered 2017-05-29: 200 mg via INTRAVENOUS

## 2017-05-29 MED ORDER — APIXABAN 5 MG PO TABS
5.0000 mg | ORAL_TABLET | Freq: Two times a day (BID) | ORAL | Status: DC
  Administered 2017-05-29 – 2017-05-30 (×2): 5 mg via ORAL
  Filled 2017-05-29 (×2): qty 1

## 2017-05-29 MED ORDER — ADENOSINE 6 MG/2ML IV SOLN
INTRAVENOUS | Status: AC
  Filled 2017-05-29: qty 8

## 2017-05-29 SURGICAL SUPPLY — 17 items
BLANKET WARM UNDERBOD FULL ACC (MISCELLANEOUS) ×3 IMPLANT
CATH MAPPNG PENTARAY F 2-6-2MM (CATHETERS) ×1 IMPLANT
CATH NAVISTAR SMARTTOUCH DF (ABLATOR) ×3 IMPLANT
CATH SOUNDSTAR 3D IMAGING (CATHETERS) ×3 IMPLANT
CATH WEBSTER BI DIR CS D-F CRV (CATHETERS) ×3 IMPLANT
COVER SWIFTLINK CONNECTOR (BAG) ×3 IMPLANT
NEEDLE BAYLIS TRANSSEPTAL 71CM (NEEDLE) ×3 IMPLANT
PACK EP LATEX FREE (CUSTOM PROCEDURE TRAY) ×2
PACK EP LF (CUSTOM PROCEDURE TRAY) ×1 IMPLANT
PAD DEFIB LIFELINK (PAD) ×3 IMPLANT
PATCH CARTO3 (PAD) ×3 IMPLANT
PENTARAY F 2-6-2MM (CATHETERS) ×3
SHEATH AVANTI 11CM 7FR (SHEATH) ×6 IMPLANT
SHEATH AVANTI 11CM 9FR (SHEATH) ×3 IMPLANT
SHEATH AVANTI 11F 11CM (SHEATH) ×6 IMPLANT
SHEATH SWARTZ TS SL2 63CM 8.5F (SHEATH) ×3 IMPLANT
TUBING SMART ABLATE COOLFLOW (TUBING) ×3 IMPLANT

## 2017-05-29 NOTE — Transfer of Care (Signed)
Immediate Anesthesia Transfer of Care Note  Patient: Francisco Reed  Procedure(s) Performed: TRANSESOPHAGEAL ECHOCARDIOGRAM (TEE) (N/A )  Patient Location: Endoscopy Unit  Anesthesia Type:MAC  Level of Consciousness: drowsy and patient cooperative  Airway & Oxygen Therapy: Patient Spontanous Breathing and Patient connected to nasal cannula oxygen  Post-op Assessment: Report given to RN, Post -op Vital signs reviewed and stable and Patient moving all extremities X 4  Post vital signs: Reviewed and stable  Last Vitals:  Vitals Value Taken Time  BP 90/66 05/29/2017  9:19 AM  Temp 36.5 C 05/29/2017  9:16 AM  Pulse 85 05/29/2017  9:20 AM  Resp 19 05/29/2017  9:20 AM  SpO2 98 % 05/29/2017  9:20 AM  Vitals shown include unvalidated device data.  Last Pain:  Vitals:   05/29/17 0916  TempSrc: Oral  PainSc:          Complications: No apparent anesthesia complications

## 2017-05-29 NOTE — Progress Notes (Signed)
  Echocardiogram Echocardiogram Transesophageal has been performed.  Randa Lynn Kesi Perrow 05/29/2017, 9:43 AM

## 2017-05-29 NOTE — Transfer of Care (Signed)
Immediate Anesthesia Transfer of Care Note  Patient: JESIAH GRISMER  Procedure(s) Performed: ATRIAL FIBRILLATION ABLATION (N/A )  Patient Location: Cath Lab  Anesthesia Type:General  Level of Consciousness: awake, alert  and oriented  Airway & Oxygen Therapy: Patient Spontanous Breathing and Patient connected to face mask oxygen  Post-op Assessment: Report given to RN and Post -op Vital signs reviewed and stable  Post vital signs: Reviewed and stable  Last Vitals:  Vitals Value Taken Time  BP 86/64 05/29/2017  1:35 PM  Temp 36.3 C 05/29/2017  1:34 PM  Pulse 57 05/29/2017  1:36 PM  Resp 19 05/29/2017  1:36 PM  SpO2 98 % 05/29/2017  1:36 PM  Vitals shown include unvalidated device data.  Last Pain:  Vitals:   05/29/17 1334  TempSrc: Temporal  PainSc:          Complications: No apparent anesthesia complications

## 2017-05-29 NOTE — H&P (Signed)
PCP: Idelle Crouch, MD Primary Cardiologist: Dr Mabeline Caras Primary EP: Dr Rayann Heman  Francisco Reed is a 57 y.o. male who presents today for electrophysiology study and ablation for recurrent afib.  He has struggled with afib.  Today, he denies symptoms of palpitations, chest pain, shortness of breath,  lower extremity edema, dizziness, presyncope, or syncope.  The patient is otherwise without complaint today.       Past Medical History:  Diagnosis Date  . Hyperlipemia    pt denies  . Obstructive sleep apnea    mild  . Persistent atrial fibrillation [facility])         Past Surgical History:  Procedure Laterality Date  . CARDIOVERSION    . CARDIOVERSION N/A 12/24/2016   Procedure: CARDIOVERSION;  Surgeon: Josue Hector, MD;  Location: [facility] ENDOSCOPY;  Service: Cardiovascular;  Laterality: N/A;  . CARDIOVERSION N/A 03/20/2017   Procedure: CARDIOVERSION;  Surgeon: Fay Records, MD;  Location: [facility] ENDOSCOPY;  Service: Cardiovascular;  Laterality: N/A;  . ELECTROPHYSIOLOGIC STUDY N/A 12/28/2014   Procedure: CARDIOVERSION;  Surgeon: Corey Skains, MD;  Location: [facility] ORS;  Service: Cardiovascular;  Laterality: N/A;  . ELECTROPHYSIOLOGIC STUDY N/A 09/07/2015   Procedure: CARDIOVERSION;  Surgeon: Corey Skains, MD;  Location: [facility] ORS;  Service: Cardiovascular;  Laterality: N/A;  . ELECTROPHYSIOLOGIC STUDY N/A 03/18/2016   Procedure: Atrial Fibrillation Ablation;  Surgeon: [name], MD;  Location: [place] CV LAB;  Service: Cardiovascular;  Laterality: N/A;  . TEE WITHOUT CARDIOVERSION N/A 09/07/2015   Procedure: TRANSESOPHAGEAL ECHOCARDIOGRAM (TEE);  Surgeon: Corey Skains, MD;  Location: [facility] ORS;  Service: Cardiovascular;  Laterality: N/A;  . TEE WITHOUT CARDIOVERSION N/A 03/17/2016   Procedure: TRANSESOPHAGEAL ECHOCARDIOGRAM (TEE);  Surgeon: Lelon Perla, MD;  Location: [facility] ENDOSCOPY;  Service: Cardiovascular;  Laterality: N/A;  . TEE WITHOUT CARDIOVERSION N/A  12/24/2016   Procedure: TRANSESOPHAGEAL ECHOCARDIOGRAM (TEE);  Surgeon: Josue Hector, MD;  Location: [facility] ENDOSCOPY;  Service: Cardiovascular;  Laterality: N/A;    ROS- all systems are reviewed and negatives except as per HPI above        Current Outpatient Medications  Medication Sig Dispense Refill  . acetaminophen (TYLENOL) 500 MG tablet Take 1,000 mg by mouth every 8 (eight) hours as needed for mild pain or moderate pain.    Marland Kitchen apixaban (ELIQUIS) 5 MG TABS tablet Take 1 tablet (5 mg total) 2 (two) times daily by mouth. 60 tablet 3  . ascorbic acid (VITAMIN C) 1000 MG tablet Take 1,000 mg by mouth daily.    . Biotin 10000 MCG TABS Take 1 tablet by mouth daily.     Marland Kitchen buPROPion (WELLBUTRIN SR) 150 MG 12 hr tablet Take 150 mg by mouth daily.    . metoprolol succinate (TOPROL-XL) 100 MG 24 hr tablet Take 1 tablet (100 mg total) daily by mouth. Take with or immediately following a meal. 90 tablet 3   No current facility-administered medications for this visit.     Physical Exam:  GEN- The patient is well appearing, alert and oriented x 3 today.   Head- normocephalic, atraumatic Eyes-  Sclera clear, conjunctiva pink Ears- hearing intact Oropharynx- clear Lungs- Clear to ausculation bilaterally, normal work of breathing Heart- irregular rate and rhythm, no murmurs, rubs or gallops, PMI not laterally displaced GI- soft, NT, ND, + BS Extremities- no clubbing, cyanosis, or edema   Assessment and Plan:  1. Persistent atrial fibrillation  Therapeutic strategies for afib including medicine and ablation were discussed in  detail with the patient today. Risk, benefits, and alternatives to EP study and radiofrequency ablation for afib were also discussed in detail today. These risks include but are not limited to stroke, bleeding, vascular damage, tamponade, perforation, damage to the esophagus, lungs, and other structures, pulmonary vein stenosis, worsening renal function, and  death. The patient understands these risk and wishes to proceed at this time  [name] MD, [facility] 05/29/2017 7:58 AM

## 2017-05-29 NOTE — Discharge Instructions (Addendum)
Post procedure care instructions No driving for 4 days. No lifting over 5 lbs for 1 week. No vigorous or sexual activity for 1 week. You may return to work on 06/05/17. Keep procedure site clean & dry. If you notice increased pain, swelling, bleeding or pus, call/return!  You may shower, but no soaking baths/hot tubs/pools for 1 week.     You have an appointment set up with the [facility].  Multiple studies have shown that being followed by a dedicated atrial fibrillation clinic in addition to the standard care you receive from your other physicians improves health. We believe that enrollment in the atrial fibrillation clinic will allow [place] to better care for you.   The phone number to the [facility] is [phone]. The clinic is staffed Monday through Friday from 8:30am to 5pm.  Parking Directions: The clinic is located in the Heart and Vascular Building connected to [facility]. 1)From [address] turn on to [address] and go to the 3rd entrance  (Heart and Vascular entrance) on the right. 2)Look to the right for Heart &Vascular Parking Garage. 3)A code for the entrance is required please call the clinic to receive this.   4)Take the elevators to the 1st floor. Registration is in the room with the glass walls at the end of the hallway.  If you have any trouble parking or locating the clinic, please dont hesitate to call [phone].   Information on my medicine - ELIQUIS (apixaban)  This medication education was reviewed with me or my healthcare representative as part of my discharge preparation.  The pharmacist that spoke with me during my hospital stay was:  Saundra Shelling, [facility]  Why was Eliquis prescribed for you? Eliquis was prescribed for you to reduce the risk of a blood clot forming that can cause a stroke if you have a medical condition called atrial fibrillation (a type of irregular heartbeat).  What do You need to know about  Eliquis ? Take your Eliquis TWICE DAILY - one tablet in the morning and one tablet in the evening with or without food. If you have difficulty swallowing the tablet whole please discuss with your pharmacist how to take the medication safely.  Take Eliquis exactly as prescribed by your doctor and DO NOT stop taking Eliquis without talking to the doctor who prescribed the medication.  Stopping may increase your risk of developing a stroke.  Refill your prescription before you run out.  After discharge, you should have regular check-up appointments with your healthcare provider that is prescribing your Eliquis.  In the future your dose may need to be changed if your kidney function or weight changes by a significant amount or as you get older.  What do you do if you miss a dose? If you miss a dose, take it as soon as you remember on the same day and resume taking twice daily.  Do not take more than one dose of ELIQUIS at the same time to make up a missed dose.  Important Safety Information A possible side effect of Eliquis is bleeding. You should call your healthcare provider right away if you experience any of the following: ? Bleeding from an injury or your nose that does not stop. ? Unusual colored urine (red or dark brown) or unusual colored stools (red or black). ? Unusual bruising for unknown reasons. ? A serious fall or if you hit your head (even if there is no bleeding).  Some medicines may  interact with Eliquis and might increase your risk of bleeding or clotting while on Eliquis. To help avoid this, consult your healthcare provider or pharmacist prior to using any new prescription or non-prescription medications, including herbals, vitamins, non-steroidal anti-inflammatory drugs (NSAIDs) and supplements.  This website has more information on Eliquis (apixaban): http://www.eliquis.com/eliquis/home

## 2017-05-29 NOTE — Anesthesia Preprocedure Evaluation (Signed)
Anesthesia Evaluation  Patient identified by MRN, date of birth, ID band Patient awake    Reviewed: Allergy & Precautions, NPO status , Patient's Chart, lab work & pertinent test results  Airway Mallampati: II  TM Distance: >3 FB Neck ROM: Full    Dental  (+) Teeth Intact, Dental Advisory Given   Pulmonary    breath sounds clear to auscultation       Cardiovascular  Rhythm:Regular Rate:Normal     Neuro/Psych    GI/Hepatic   Endo/Other    Renal/GU      Musculoskeletal   Abdominal   Peds  Hematology   Anesthesia Other Findings   Reproductive/Obstetrics                             Anesthesia Physical Anesthesia Plan  ASA: III  Anesthesia Plan: General   Post-op Pain Management:    Induction: Intravenous  PONV Risk Score and Plan: Ondansetron and Dexamethasone  Airway Management Planned: Oral ETT  Additional Equipment:   Intra-op Plan:   Post-operative Plan: Extubation in OR  Informed Consent: I have reviewed the patients History and Physical, chart, labs and discussed the procedure including the risks, benefits and alternatives for the proposed anesthesia with the patient or authorized representative who has indicated his/her understanding and acceptance.     Dental advisory given  Plan Discussed with: Anesthesiologist and CRNA  Anesthesia Plan Comments:         Anesthesia Quick Evaluation  

## 2017-05-29 NOTE — Anesthesia Postprocedure Evaluation (Signed)
Anesthesia Post Note  Patient: Francisco Reed  Procedure(s) Performed: ATRIAL FIBRILLATION ABLATION (N/A )     Patient location during evaluation: Cath Lab Anesthesia Type: General Level of consciousness: awake and alert Pain management: pain level controlled Vital Signs Assessment: post-procedure vital signs reviewed and stable Respiratory status: spontaneous breathing, nonlabored ventilation, respiratory function stable and patient connected to nasal cannula oxygen Cardiovascular status: blood pressure returned to baseline and stable Postop Assessment: no apparent nausea or vomiting Anesthetic complications: no    Last Vitals:  Vitals:   05/29/17 1515 05/29/17 1600  BP: 91/67 (!) 167/57  Pulse:  (!) 54  Resp: 15 13  Temp:    SpO2:  100%    Last Pain:  Vitals:   05/29/17 1433  TempSrc: Oral  PainSc:                  Essence Merle COKER

## 2017-05-29 NOTE — H&P (Signed)
Office Visit   03/23/2017 [facility]    [name], MD  Cardiology   PAF (paroxysmal atrial fibrillation) [facility])  Dx   Atrial Fibrillation ; Referred by Idelle Crouch, MD  Reason for Visit   Additional Documentation   Vitals:   BP 122/80   Pulse 55    Ht 5\' 10"  (1.778 m)   Wt 223 lb (101.2 kg)   BMI 32.00 kg/m   BSA 2.24 m      More Vitals   Flowsheets:   MEWS Score,   Anthropometrics     Encounter Info:   Billing Info,   History,   Allergies,   Detailed Report     All Notes   Progress Notes by [name], MD at 03/23/2017 11:00 AM  Author: [name], MD Author Type: Physician Filed: 03/23/2017 4:50 PM  Note Status: Signed Cosign: Cosign Not Required Encounter Date: 03/23/2017  Editor: [name], MD (Physician)      PCP: Idelle Crouch, MD Primary Cardiologist: Dr Mabeline Reed Primary EP: Dr Rayann Heman  Francisco Reed is a 57 y.o. male who presents today for routine electrophysiology followup.  Since last being seen in our clinic, the patient reports doing reasonably well.  He had afib again recently in the setting of an argument with his spouse.  He has remained in sinus since cardioversion. Today, he denies symptoms of palpitations, chest pain, shortness of breath,  lower extremity edema, dizziness, presyncope, or syncope.  The patient is otherwise without complaint today.       Past Medical History:  Diagnosis Date  . Hyperlipemia    pt denies  . Obstructive sleep apnea    mild  . Persistent atrial fibrillation [facility])         Past Surgical History:  Procedure Laterality Date  . CARDIOVERSION    . CARDIOVERSION N/A 12/24/2016   Procedure: CARDIOVERSION;  Surgeon: Josue Hector, MD;  Location: [facility] ENDOSCOPY;  Service: Cardiovascular;  Laterality: N/A;  . CARDIOVERSION N/A 03/20/2017   Procedure: CARDIOVERSION;  Surgeon: Fay Records, MD;  Location: [facility] ENDOSCOPY;  Service: Cardiovascular;  Laterality: N/A;  .  ELECTROPHYSIOLOGIC STUDY N/A 12/28/2014   Procedure: CARDIOVERSION;  Surgeon: Corey Skains, MD;  Location: [facility] ORS;  Service: Cardiovascular;  Laterality: N/A;  . ELECTROPHYSIOLOGIC STUDY N/A 09/07/2015   Procedure: CARDIOVERSION;  Surgeon: Corey Skains, MD;  Location: [facility] ORS;  Service: Cardiovascular;  Laterality: N/A;  . ELECTROPHYSIOLOGIC STUDY N/A 03/18/2016   Procedure: Atrial Fibrillation Ablation;  Surgeon: [name], MD;  Location: [place] CV LAB;  Service: Cardiovascular;  Laterality: N/A;  . TEE WITHOUT CARDIOVERSION N/A 09/07/2015   Procedure: TRANSESOPHAGEAL ECHOCARDIOGRAM (TEE);  Surgeon: Corey Skains, MD;  Location: [facility] ORS;  Service: Cardiovascular;  Laterality: N/A;  . TEE WITHOUT CARDIOVERSION N/A 03/17/2016   Procedure: TRANSESOPHAGEAL ECHOCARDIOGRAM (TEE);  Surgeon: Lelon Perla, MD;  Location: [facility] ENDOSCOPY;  Service: Cardiovascular;  Laterality: N/A;  . TEE WITHOUT CARDIOVERSION N/A 12/24/2016   Procedure: TRANSESOPHAGEAL ECHOCARDIOGRAM (TEE);  Surgeon: Josue Hector, MD;  Location: [facility] ENDOSCOPY;  Service: Cardiovascular;  Laterality: N/A;    ROS- all systems are reviewed and negatives except as per HPI above        Current Outpatient Medications  Medication Sig Dispense Refill  . acetaminophen (TYLENOL) 500 MG tablet Take 1,000 mg by mouth every 8 (eight) hours as needed for mild pain or moderate pain.    Marland Kitchen apixaban (ELIQUIS) 5 MG  TABS tablet Take 1 tablet (5 mg total) 2 (two) times daily by mouth. 60 tablet 3  . ascorbic acid (VITAMIN C) 1000 MG tablet Take 1,000 mg by mouth daily.    . Biotin 10000 MCG TABS Take 1 tablet by mouth daily.     Marland Kitchen buPROPion (WELLBUTRIN SR) 150 MG 12 hr tablet Take 150 mg by mouth daily.    . metoprolol succinate (TOPROL-XL) 100 MG 24 hr tablet Take 1 tablet (100 mg total) daily by mouth. Take with or immediately following a meal. 90 tablet 3   No current facility-administered medications for this  visit.     Physical Exam:    Vitals:   03/23/17 1121  BP: 122/80  Pulse: (!) 55  Weight: 223 lb (101.2 kg)  Height: 5\' 10"  (1.778 m)    GEN- The patient is well appearing, alert and oriented x 3 today.   Head- normocephalic, atraumatic Eyes-  Sclera clear, conjunctiva pink Ears- hearing intact Oropharynx- clear Lungs- Clear to ausculation bilaterally, normal work of breathing Heart- Regular rate and rhythm, no murmurs, rubs or gallops, PMI not laterally displaced GI- soft, NT, ND, + BS Extremities- no clubbing, cyanosis, or edema  EKG tracing ordered today is personally reviewed and shows sinus rhythm 55 bpm, PR 136 msec, QRS 102 msec, Qtc 415 msec, nonspecific St/T changes  Assessment and Plan:  1. Persistent atrial fibrillation He has had 2 cardioversions since November. Therapeutic strategies for afib including medicine and repeat ablation were discussed in detail with the patient today.  At this time, he would prefer to reduce stress, avoid caffeine and avoid ablation.  If he has additional afib, he may be more willing to consider ablation.  Return to see me in 3 months   [name] MD, [facility] 03/23/2017 11:40 AM     For TEE prior to atrial fibrillation ablation; no changes. Kirk Ruths, MD

## 2017-05-29 NOTE — Anesthesia Postprocedure Evaluation (Signed)
Anesthesia Post Note  Patient: Francisco Reed  Procedure(s) Performed: TRANSESOPHAGEAL ECHOCARDIOGRAM (TEE) (N/A )     Patient location during evaluation: PACU Anesthesia Type: MAC Level of consciousness: awake and alert Pain management: pain level controlled Vital Signs Assessment: post-procedure vital signs reviewed and stable Respiratory status: spontaneous breathing, nonlabored ventilation and respiratory function stable Cardiovascular status: stable and blood pressure returned to baseline Postop Assessment: no apparent nausea or vomiting Anesthetic complications: no    Last Vitals:  Vitals:   05/29/17 1000 05/29/17 1005  BP:  103/77  Pulse:  98  Resp:  (!) 24  Temp:    SpO2: 98% 98%    Last Pain:  Vitals:   05/29/17 0935  TempSrc:   PainSc: 0-No pain                 Enoch Moffa,W. EDMOND

## 2017-05-29 NOTE — Interval H&P Note (Signed)
History and Physical Interval Note:  05/29/2017 8:20 AM  Francisco Reed  has presented today for surgery, with the diagnosis of a fib  The various methods of treatment have been discussed with the patient and family. After consideration of risks, benefits and other options for treatment, the patient has consented to  Procedure(s): TRANSESOPHAGEAL ECHOCARDIOGRAM (TEE) (N/A) as a surgical intervention .  The patient's history has been reviewed, patient examined, no change in status, stable for surgery.  I have reviewed the patient's chart and labs.  Questions were answered to the patient's satisfaction.     Kirk Ruths

## 2017-05-29 NOTE — Progress Notes (Addendum)
Site area: rt groin 3 fv sheaths Site Prior to Removal:  Level 0 Pressure Applied For: 20 minutes Manual:   yes Patient Status During Pull:  stable Post Pull Site:  Level  0 Post Pull Instructions Given:  yes Post Pull Pulses Present: palpable Dressing Applied:  Gauze and tegaderm Bedrest begins @ 1410 IV saline locked

## 2017-05-29 NOTE — Anesthesia Procedure Notes (Signed)
Procedure Name: Intubation Date/Time: 05/29/2017 11:11 AM Performed by: Genelle Bal, CRNA Pre-anesthesia Checklist: Patient identified, Emergency Drugs available, Suction available and Patient being monitored Patient Re-evaluated:Patient Re-evaluated prior to induction Oxygen Delivery Method: Circle system utilized Preoxygenation: Pre-oxygenation with 100% oxygen Induction Type: IV induction Ventilation: Mask ventilation without difficulty Laryngoscope Size: Miller and 2 Grade View: Grade I Tube type: Oral Tube size: 7.5 mm Number of attempts: 1 Airway Equipment and Method: Stylet and Oral airway Placement Confirmation: ETT inserted through vocal cords under direct vision,  positive ETCO2 and breath sounds checked- equal and bilateral Secured at: 23 cm Tube secured with: Tape Dental Injury: Teeth and Oropharynx as per pre-operative assessment

## 2017-05-29 NOTE — Progress Notes (Signed)
    Transesophageal Echocardiogram Note  KARTIER BENNISON [id number] 1960-03-28  Procedure: Transesophageal Echocardiogram Indications: Atrial fibrillation   Procedure Details Consent: Obtained Time Out: Verified patient identification, verified procedure, site/side was marked, verified correct patient position, special equipment/implants available, Radiology Safety Procedures followed,  medications/allergies/relevent history reviewed, required imaging and test results available.  Performed  Medications:  Pt sedated by anesthesia with diprovan 100 mg IV total.  Severe LV dysfunction (EF 25-30); mild RVE; severe RV dysfunction; mild biatrial enlargement; mild MR and TR; trace AI; no LAA thrombus.   Complications: No apparent complications Patient did tolerate procedure well.  Kirk Ruths, MD

## 2017-05-30 ENCOUNTER — Encounter (HOSPITAL_COMMUNITY): Payer: Self-pay | Admitting: Internal Medicine

## 2017-05-30 ENCOUNTER — Other Ambulatory Visit: Payer: Self-pay

## 2017-05-30 DIAGNOSIS — I481 Persistent atrial fibrillation: Secondary | ICD-10-CM | POA: Diagnosis not present

## 2017-05-30 DIAGNOSIS — I5082 Biventricular heart failure: Secondary | ICD-10-CM | POA: Diagnosis not present

## 2017-05-30 DIAGNOSIS — I10 Essential (primary) hypertension: Secondary | ICD-10-CM | POA: Diagnosis not present

## 2017-05-30 DIAGNOSIS — Z7901 Long term (current) use of anticoagulants: Secondary | ICD-10-CM | POA: Diagnosis not present

## 2017-05-30 DIAGNOSIS — K219 Gastro-esophageal reflux disease without esophagitis: Secondary | ICD-10-CM | POA: Diagnosis not present

## 2017-05-30 DIAGNOSIS — E785 Hyperlipidemia, unspecified: Secondary | ICD-10-CM | POA: Diagnosis not present

## 2017-05-30 DIAGNOSIS — G4733 Obstructive sleep apnea (adult) (pediatric): Secondary | ICD-10-CM | POA: Diagnosis not present

## 2017-05-30 MED ORDER — METOPROLOL SUCCINATE ER 50 MG PO TB24: 50.0000 mg | ORAL_TABLET | Freq: Every day | ORAL | Status: DC

## 2017-05-30 MED ORDER — PANTOPRAZOLE SODIUM 40 MG PO TBEC: 40.0000 mg | DELAYED_RELEASE_TABLET | Freq: Every day | ORAL | 0 refills | Status: DC

## 2017-05-30 MED ORDER — METOPROLOL SUCCINATE ER 100 MG PO TB24: 50.0000 mg | ORAL_TABLET | Freq: Every day | ORAL | 3 refills | Status: DC

## 2017-05-30 MED ORDER — PANTOPRAZOLE SODIUM 40 MG PO TBEC
40.0000 mg | DELAYED_RELEASE_TABLET | Freq: Every day | ORAL | Status: AC
  Administered 2017-05-30: 10:00:00 40 mg via ORAL
  Filled 2017-05-30: qty 1

## 2017-05-30 NOTE — Discharge Summary (Signed)
ELECTROPHYSIOLOGY PROCEDURE DISCHARGE SUMMARY    Patient ID: Francisco Reed,  MRN: [id number], DOB/AGE: [birth year] 57 y.o.  Admit date: 05/29/2017 Discharge date: 05/30/2017  Electrophysiologist: [name], MD  Primary Discharge Diagnosis:  Persistent atrial fibrillation  Secondary Discharge Diagnosis:  Chronic systolic dysfunction  Procedures This Admission:  1.  Electrophysiology study and radiofrequency catheter ablation on 05/29/17 by Dr [name].   This study demonstrated:  1. Atrial fibrillation upon presentation.    2. Intracardiac echo reveals a moderate sized left atrium with four separate pulmonary veins without evidence of pulmonary vein stenosis.  3. Return of electrical activity along the carina between the LSPV and LIPV.  The right superior and inferior pulmonary veins were quiescent from a prior ablation procedure and did not require any additional ablation today.   4. Successful electrical re-isolation of the left superior and left inferior pulmonary veins   5. Additional left atrial ablation was performed with a standard box lesion created along the posterior wall of the left atrium as well as at the SVC/RA junction  6. Atrial fibrillation successfully cardioverted to sinus rhythm.  2. TEE performed 05/29/17 by Dr Stanford Breed.  This study demonstrated EF 25% with severe biventricular failure.   Brief HPI: Francisco Reed is a 57 y.o. male with a history of persistent atrial fibrillation.  They have failed medical therapy with multaq previously. Risks, benefits, and alternatives to catheter ablation of atrial fibrillation were reviewed with the patient who wished to proceed.  The patient underwent TEE prior to the procedure which demonstrated normal LV function and no LAA thrombus.  Biventricular failure was noted.  Hospital Course:  The patient was admitted and underwent EPS/RFCA of atrial fibrillation with details as outlined above.  They were monitored on  telemetry overnight which demonstrated sinus rhythm.  Groin was without complication on the day of discharge.  The patient was examined and considered to be stable for discharge.  Wound care and restrictions were reviewed with the patient.  The patient will be seen back by Roderic Palau, NP in 4 weeks and Dr Rayann Heman in 12 weeks for post ablation follow up.   This patients CHA2DS2-VASc Score and unadjusted Ischemic Stroke Rate (% per year) is equal to 0.6 % stroke rate/year from a score of 1 Above score calculated as 1 point each if present [CHF, HTN, DM, Vascular=MI/PAD/Aortic Plaque, Age if 65-74, or Male] Above score calculated as 2 points each if present [Age > 75, or Stroke/TIA/TE]   Physical Exam: Vitals:   05/29/17 1800 05/29/17 2002 05/30/17 0357 05/30/17 0702  BP: 99/68 106/64 106/70 102/67  Pulse: 62 74 75 70  Resp: 16 (!) 22 13 19   Temp:  97.9 F (36.6 C) (!) 97.4 F (36.3 C) 97.8 F (36.6 C)  TempSrc:  Oral Oral Oral  SpO2: 98% 98% 96% 95%  Weight:   220 lb 10.9 oz (100.1 kg)     GEN- The patient is well appearing, alert and oriented x 3 today.   HEENT: normocephalic, atraumatic; sclera clear, conjunctiva pink; hearing intact; oropharynx clear; neck supple  Lungs- Clear to ausculation bilaterally, normal work of breathing.  No wheezes, rales, rhonchi Heart- Regular rate and rhythm, no murmurs, rubs or gallops  GI- soft, non-tender, non-distended, bowel sounds present  Extremities- no clubbing, cyanosis, or edema; DP/PT/radial pulses 2+ bilaterally, groin without hematoma/bruit MS- no significant deformity or atrophy Skin- warm and dry, no rash or lesion Psych- euthymic mood, full affect Neuro- strength and  sensation are intact   Labs:   Lab Results  Component Value Date   WBC 5.5 05/19/2017   HGB 14.1 05/19/2017   HCT 42.4 05/19/2017   MCV 93.2 05/19/2017   PLT 177 05/19/2017   No results for input(s): NA, K, CL, CO2, BUN, CREATININE, CALCIUM, PROT, BILITOT,  ALKPHOS, ALT, AST, GLUCOSE in the last 168 hours.  Invalid input(s): LABALBU   Discharge Medications:  Allergies as of 05/30/2017      Reactions   No Known Allergies       Medication List    TAKE these medications   acetaminophen 500 MG tablet Commonly known as:  TYLENOL Take 1,000 mg by mouth every 8 (eight) hours as needed for mild pain or moderate pain.   ascorbic acid 1000 MG tablet Commonly known as:  VITAMIN C Take 1,000 mg by mouth daily.   Biotin 10000 MCG Tabs Take 1 tablet by mouth daily.   buPROPion 150 MG 24 hr tablet Commonly known as:  WELLBUTRIN XL Take 150 mg by mouth daily.   diltiazem 30 MG tablet Commonly known as:  CARDIZEM Take 1 tablet (30 mg total) by mouth every 4 (four) hours as needed (for heart rate greater than 100).   ELIQUIS 5 MG Tabs tablet Generic drug:  apixaban TAKE 1 TABLET (5 MG TOTAL) 2 (TWO) TIMES DAILY BY MOUTH.   metoprolol succinate 100 MG 24 hr tablet Commonly known as:  TOPROL-XL Take 1 tablet (100 mg total) by mouth daily. Take with or immediately following a meal.   pantoprazole 40 MG tablet Commonly known as:  PROTONIX Take 1 tablet (40 mg total) by mouth daily.       Disposition:   Follow-up Information    [facility] Follow up on 06/25/2017.   Specialty:  Cardiology Why:  11:00AM Contact information: [address] [id number] [place] [phone]       [name], MD Follow up on 08/26/2017.   Specialty:  Cardiology Why:  11:00AM Contact information: [place] [address] [phone]            Army Fossa MD 05/30/2017 7:57 AM

## 2017-05-30 NOTE — Progress Notes (Signed)
Removed dressing from groin site with noted oozing. Pressure held for 5 minutes, bandaid applied. Rechecked site and bandaid removed with no active bleeding noted. Reapplied bandaid and discussed with patient and wife how to manage bleeding if reoccurs at home. Dr. Rayann Heman and Benard Rink PA aware of oozing.  Patient had episode of vomiting a small amount. Given protonix and sprite, resting for a few minutes. Stated he felt better and wanted to go home. Notified Bernerd Pho PA of episode.

## 2017-06-01 ENCOUNTER — Ambulatory Visit: Payer: BLUE CROSS/BLUE SHIELD | Admitting: Internal Medicine

## 2017-06-05 ENCOUNTER — Telehealth: Payer: Self-pay | Admitting: Internal Medicine

## 2017-06-05 NOTE — Telephone Encounter (Signed)
Reports ablation last Friday.  States he is feeling a lot better. Reports normal bruising on groin/leg area. Reports when he walks "a heaviness from the waist down".  Denies pain, describes it as a soreness in groin area. Upon further discussion w/ patient he thinks he may be feeling this way b/c he is favoring that leg due to soreness. Pt going to continue to monitor and call office back if worsens.  Pt appreciative of call/discussion.

## 2017-06-05 NOTE — Telephone Encounter (Signed)
Patient calling, states that he just had an ablation and would like to discuss some concerns with nurse

## 2017-06-25 ENCOUNTER — Ambulatory Visit (HOSPITAL_COMMUNITY)
Admission: RE | Admit: 2017-06-25 | Discharge: 2017-06-25 | Disposition: A | Discharge status: Home / Self Care | Payer: BLUE CROSS/BLUE SHIELD | Source: Ambulatory Visit | Attending: Nurse Practitioner | Admitting: Nurse Practitioner

## 2017-06-25 ENCOUNTER — Encounter (HOSPITAL_COMMUNITY): Payer: Self-pay | Admitting: Nurse Practitioner

## 2017-06-25 ENCOUNTER — Other Ambulatory Visit (HOSPITAL_COMMUNITY): Payer: Self-pay | Admitting: *Deleted

## 2017-06-25 VITALS — BP 114/88 | HR 69 | Ht 70.0 in | Wt 221.4 lb

## 2017-06-25 DIAGNOSIS — R0789 Other chest pain: Secondary | ICD-10-CM | POA: Diagnosis not present

## 2017-06-25 DIAGNOSIS — G4733 Obstructive sleep apnea (adult) (pediatric): Secondary | ICD-10-CM | POA: Insufficient documentation

## 2017-06-25 DIAGNOSIS — E785 Hyperlipidemia, unspecified: Secondary | ICD-10-CM | POA: Insufficient documentation

## 2017-06-25 DIAGNOSIS — I480 Paroxysmal atrial fibrillation: Secondary | ICD-10-CM | POA: Diagnosis not present

## 2017-06-25 DIAGNOSIS — Z7901 Long term (current) use of anticoagulants: Secondary | ICD-10-CM | POA: Diagnosis not present

## 2017-06-25 DIAGNOSIS — Z79899 Other long term (current) drug therapy: Secondary | ICD-10-CM | POA: Diagnosis not present

## 2017-06-25 DIAGNOSIS — R9431 Abnormal electrocardiogram [ECG] [EKG]: Secondary | ICD-10-CM

## 2017-06-25 MED ORDER — LOSARTAN POTASSIUM 50 MG PO TABS: 25.0000 mg | ORAL_TABLET | Freq: Every day | ORAL | 3 refills | Status: DC

## 2017-06-25 NOTE — Progress Notes (Signed)
Primary Care Physician: Idelle Crouch, MD Referring Physician: Dr. Rosebud Poles is a 57 y.o. male with a h/o afib s/p ablation one year ago.  He had return to afib in November  and successful cardioversion.  After that, he went  on to have increased afib burden. Dr.Allred took him back for redo of afib 05/29/17.  He is here for one month f/u.  He denies swallowing issues, no groin issues. He is reporting some intermittent anterior chest tightness over the last 2 weeks. 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 Usually last less than 30 mins. Very remote stress test, cardiac cath probably back in the 80's. EKG shows SR with T wave invertion, leads 3 and AVF. TEE showed EF of 25-30%. Arb added today per Dr. Rayann Heman.   Today, he denies symptoms of palpitations,  shortness of breath, orthopnea, PND, lower extremity edema, dizziness, presyncope, syncope, or neurologic sequela. + intermittent chest tightness. The patient is tolerating medications without difficulties and is otherwise without complaint today.   Past Medical History:  Diagnosis Date  . Chronic systolic dysfunction of left ventricle   . Hyperlipemia    pt denies  . Obstructive sleep apnea    mild  . Persistent atrial fibrillation [facility])    Past Surgical History:  Procedure Laterality Date  . ATRIAL FIBRILLATION ABLATION N/A 05/29/2017   Procedure: ATRIAL FIBRILLATION ABLATION;  Surgeon: [name], MD;  Location: [facility];  Service: Cardiovascular;  Laterality: N/A;  . CARDIOVERSION    . CARDIOVERSION N/A 12/24/2016   Procedure: CARDIOVERSION;  Surgeon: Josue Hector, MD;  Location: [facility] ENDOSCOPY;  Service: Cardiovascular;  Laterality: N/A;  . CARDIOVERSION N/A 03/20/2017   Procedure: CARDIOVERSION;  Surgeon: Fay Records, MD;  Location: [facility] ENDOSCOPY;  Service: Cardiovascular;  Laterality: N/A;  . ELECTROPHYSIOLOGIC STUDY N/A 12/28/2014   Procedure: CARDIOVERSION;  Surgeon: Corey Skains, MD;  Location: [facility] ORS;  Service: Cardiovascular;  Laterality: N/A;  . ELECTROPHYSIOLOGIC STUDY N/A 09/07/2015   Procedure: CARDIOVERSION;  Surgeon: Corey Skains, MD;  Location: [facility] ORS;  Service: Cardiovascular;  Laterality: N/A;  . ELECTROPHYSIOLOGIC STUDY N/A 03/18/2016   Procedure: Atrial Fibrillation Ablation;  Surgeon: [name], MD;  Location: [facility];  Service: Cardiovascular;  Laterality: N/A;  . TEE WITHOUT CARDIOVERSION N/A 09/07/2015   Procedure: TRANSESOPHAGEAL ECHOCARDIOGRAM (TEE);  Surgeon: Corey Skains, MD;  Location: [facility] ORS;  Service: Cardiovascular;  Laterality: N/A;  . TEE WITHOUT CARDIOVERSION N/A 03/17/2016   Procedure: TRANSESOPHAGEAL ECHOCARDIOGRAM (TEE);  Surgeon: Lelon Perla, MD;  Location: [facility] ENDOSCOPY;  Service: Cardiovascular;  Laterality: N/A;  . TEE WITHOUT CARDIOVERSION N/A 12/24/2016   Procedure: TRANSESOPHAGEAL ECHOCARDIOGRAM (TEE);  Surgeon: Josue Hector, MD;  Location: [facility] ENDOSCOPY;  Service: Cardiovascular;  Laterality: N/A;  . TEE WITHOUT CARDIOVERSION N/A 05/29/2017   Procedure: TRANSESOPHAGEAL ECHOCARDIOGRAM (TEE);  Surgeon: Lelon Perla, MD;  Location: [facility] ENDOSCOPY;  Service: Cardiovascular;  Laterality: N/A;    Current Outpatient Medications  Medication Sig Dispense Refill  . acetaminophen (TYLENOL) 500 MG tablet Take 1,000 mg by mouth every 8 (eight) hours as needed for mild pain or moderate pain.    Marland Kitchen ascorbic acid (VITAMIN C) 1000 MG tablet Take 1,000 mg by mouth daily.    . Biotin 10000 MCG TABS Take 1 tablet by mouth daily.     Marland Kitchen buPROPion (WELLBUTRIN XL) 150 MG 24  hr tablet Take 150 mg by mouth daily.  11  . diltiazem (CARDIZEM) 30 MG tablet Take 1 tablet (30 mg total) by mouth every 4 (four) hours as needed (for heart rate greater than 100). 30 tablet 0  . ELIQUIS 5 MG TABS tablet TAKE 1 TABLET (5 MG TOTAL) 2 (TWO) TIMES DAILY BY MOUTH. 60 tablet 10  .  metoprolol succinate (TOPROL-XL) 50 MG 24 hr tablet Take 1 tablet (50 mg total) by mouth daily. Take with or immediately following a meal.    . pantoprazole (PROTONIX) 40 MG tablet Take 1 tablet (40 mg total) by mouth daily. 45 tablet 0  . losartan (COZAAR) 50 MG tablet Take 0.5 tablets (25 mg total) by mouth daily. 30 tablet 3   No current facility-administered medications for this encounter.     Allergies  Allergen Reactions  . No Known Allergies     Social History   Socioeconomic History  . Marital status: Married    Spouse name: Not on file  . Number of children: Not on file  . Years of education: Not on file  . Highest education level: Not on file  Occupational History  . Not on file  Social Needs  . Financial resource strain: Not on file  . Food insecurity:    Worry: Not on file    Inability: Not on file  . Transportation needs:    Medical: Not on file    Non-medical: Not on file  Tobacco Use  . Smoking status: Never Smoker  . Smokeless tobacco: Never Used  Substance and Sexual Activity  . Alcohol use: No  . Drug use: No  . Sexual activity: Not on file  Lifestyle  . Physical activity:    Days per week: Not on file    Minutes per session: Not on file  . Stress: Not on file  Relationships  . Social connections:    Talks on phone: Not on file    Gets together: Not on file    Attends religious service: Not on file    Active member of club or organization: Not on file    Attends meetings of clubs or organizations: Not on file    Relationship status: Not on file  . Intimate partner violence:    Fear of current or ex partner: Not on file    Emotionally abused: Not on file    Physically abused: Not on file    Forced sexual activity: Not on file  Other Topics Concern  . Not on file  Social History Narrative   Pt lives in [place] with spouse.  Works as a Chief Strategy Officer.    No family history on file.  ROS- All systems are reviewed and negative except as per  the HPI above  Physical Exam: Vitals:   06/25/17 1101  BP: 114/88  Pulse: 69  Weight: 221 lb 6.4 oz (100.4 kg)  Height: 5\' 10"  (1.778 m)   Wt Readings from Last 3 Encounters:  06/25/17 221 lb 6.4 oz (100.4 kg)  05/30/17 220 lb 10.9 oz (100.1 kg)  05/19/17 223 lb (101.2 kg)    Labs: Lab Results  Component Value Date   NA 140 05/19/2017   K 4.4 05/19/2017   CL 107 05/19/2017   CO2 27 05/19/2017   GLUCOSE 113 (H) 05/19/2017   BUN 17 05/19/2017   CREATININE 1.14 05/19/2017   CALCIUM 9.0 05/19/2017   MG 1.8 09/06/2013   Lab Results  Component Value Date   INR  1.2 09/06/2013   No results found for: CHOL, HDL, LDLCALC, TRIG   GEN- The patient is well appearing, alert and oriented x 3 today.   Head- normocephalic, atraumatic Eyes-  Sclera clear, conjunctiva pink Ears- hearing intact Oropharynx- clear Neck- supple, no JVP Lymph- no cervical lymphadenopathy Lungs- Clear to ausculation bilaterally, normal work of breathing Heart- regular rate and rhythm, no murmurs, rubs or gallops, PMI not laterally displaced GI- soft, NT, ND, + BS Extremities- no clubbing, cyanosis, or edema MS- no significant deformity or atrophy Skin- no rash or lesion Psych- euthymic mood, full affect Neuro- strength and sensation are intact  EKG- NSR at 69 bpm, PR int 146 ms, qrs int 441 ms,T wave inversion in lead 3 and avf ( not new) Epic records reviewed TEE-Study Conclusions  - Left ventricle: Systolic function was severely reduced. The   estimated ejection fraction was in the range of 25% to 30%.   Diffuse hypokinesis. - Aortic valve: No evidence of vegetation. There was trivial   regurgitation. - Mitral valve: No evidence of vegetation. There was mild   regurgitation. - Left atrium: The atrium was mildly dilated. No evidence of   thrombus in the atrial cavity or appendage. There was mild   spontaneous echo contrast (&quot;smoke&quot;) in the cavity. - Right ventricle: The cavity  size was mildly dilated. Systolic   function was severely reduced. - Right atrium: The atrium was mildly dilated. - Atrial septum: No defect or patent foramen ovale was identified. - Tricuspid valve: No evidence of vegetation. - Pulmonic valve: No evidence of vegetation.  Impressions:  - Severe global reduction in LV systolic function; mild biatrial   enlargement with no LAA thrombus; mild RVE with severely reduced   RV function; trace AI; mild MR and TR.  Assessment and Plan: 1. Paroxysmal afib S/p ablation x 2  Maintaining SR Continue on Eliquis 5 mg bid, chadsvasc score of 0  Continue metoprolol succinate  50 mg daily  2. LV dysfunction EF severely reduced Dr. Rayann Heman had asked me to start ARB on f/u today BP soft Will start losartan 50 mg 1/2 a day He will let me know what his BP readings are in one week and then will see if dose can be increased If so, he will come by afib clinic the week after that for BP check and bmet  3. Intermittent anterior chest tightness  With current symptoms, ekg changes and reduced EF will plan for lexi myoview   F/u with Dr. Glynn Octave clinic as needed   Geroge Baseman. Jyra Lagares, [facility] [address] [place], [place] [address] [phone]

## 2017-06-25 NOTE — Patient Instructions (Signed)
Your physician has recommended you make the following change in your medication:  1)losartan 1/2 tablet daily -- call with bp check in 1 week

## 2017-06-29 ENCOUNTER — Telehealth (HOSPITAL_COMMUNITY): Payer: Self-pay | Admitting: *Deleted

## 2017-06-29 NOTE — Telephone Encounter (Signed)
Patient given detailed instructions per Myocardial Perfusion Study Information Sheet for the test on 06/30/17. Patient notified to arrive 15 minutes early and that it is imperative to arrive on time for appointment to keep from having the test rescheduled.  If you need to cancel or reschedule your appointment, please call the office within 24 hours of your appointment. . Patient verbalized understanding. Zunairah Devers Jacqueline    

## 2017-06-30 ENCOUNTER — Ambulatory Visit (HOSPITAL_COMMUNITY): Payer: BLUE CROSS/BLUE SHIELD | Attending: Internal Medicine

## 2017-06-30 DIAGNOSIS — R079 Chest pain, unspecified: Secondary | ICD-10-CM | POA: Insufficient documentation

## 2017-06-30 DIAGNOSIS — R9439 Abnormal result of other cardiovascular function study: Secondary | ICD-10-CM | POA: Insufficient documentation

## 2017-06-30 DIAGNOSIS — I2510 Atherosclerotic heart disease of native coronary artery without angina pectoris: Secondary | ICD-10-CM | POA: Insufficient documentation

## 2017-06-30 DIAGNOSIS — R9431 Abnormal electrocardiogram [ECG] [EKG]: Secondary | ICD-10-CM

## 2017-06-30 DIAGNOSIS — R002 Palpitations: Secondary | ICD-10-CM | POA: Insufficient documentation

## 2017-06-30 DIAGNOSIS — R0609 Other forms of dyspnea: Secondary | ICD-10-CM | POA: Diagnosis not present

## 2017-06-30 LAB — MYOCARDIAL PERFUSION IMAGING
CHL CUP NUCLEAR SRS: 4
CHL CUP NUCLEAR SSS: 10
CSEPPHR: 76 {beats}/min
LV sys vol: 86 mL
LVDIAVOL: 175 mL (ref 62–150)
NUC STRESS TID: 1.01
RATE: 0.29
Rest HR: 55 {beats}/min
SDS: 7

## 2017-06-30 MED ORDER — REGADENOSON 0.4 MG/5ML IV SOLN
0.4000 mg | Freq: Once | INTRAVENOUS | Status: AC
Start: 2017-06-30 — End: 2017-06-30
  Administered 2017-06-30: 0.4 mg via INTRAVENOUS

## 2017-06-30 MED ORDER — TECHNETIUM TC 99M TETROFOSMIN IV KIT
31.8000 | PACK | Freq: Once | INTRAVENOUS | Status: AC | PRN
  Administered 2017-06-30: 31.8 via INTRAVENOUS
  Filled 2017-06-30: qty 32

## 2017-06-30 MED ORDER — TECHNETIUM TC 99M TETROFOSMIN IV KIT
10.5000 | PACK | Freq: Once | INTRAVENOUS | Status: AC | PRN
  Administered 2017-06-30: 10.5 via INTRAVENOUS
  Filled 2017-06-30: qty 11

## 2017-07-02 ENCOUNTER — Telehealth: Payer: Self-pay | Admitting: Nurse Practitioner

## 2017-07-02 NOTE — Telephone Encounter (Signed)
Spoke with patient after brother called AF clinic this morning concerned about timing of cath.  Patient feels well and continues to work without chest pain or shortness of breath. Concern is for stroke if we interrupt anticoagulation this soon after AF ablation.  Dr Burt Knack would be willing to do cath with holding 2 doses of [place], but this would also increase risks. Discussed extensively with patient today, he is aware to call back with any symptoms of chest pain or shortness of breath. Follow up with AF clinic and Dr Rayann Heman as scheduled.   Chanetta Marshall, NP 07/02/2017 2:46 PM

## 2017-07-22 ENCOUNTER — Other Ambulatory Visit: Payer: Self-pay

## 2017-07-22 ENCOUNTER — Encounter (HOSPITAL_COMMUNITY): Payer: Self-pay | Admitting: Nurse Practitioner

## 2017-07-22 ENCOUNTER — Ambulatory Visit (HOSPITAL_COMMUNITY)
Admission: RE | Admit: 2017-07-22 | Discharge: 2017-07-22 | Disposition: A | Discharge status: Home / Self Care | Payer: BLUE CROSS/BLUE SHIELD | Source: Ambulatory Visit | Attending: Nurse Practitioner | Admitting: Nurse Practitioner

## 2017-07-22 VITALS — BP 126/78 | HR 66 | Ht 70.0 in | Wt 218.8 lb

## 2017-07-22 DIAGNOSIS — R0789 Other chest pain: Secondary | ICD-10-CM | POA: Insufficient documentation

## 2017-07-22 DIAGNOSIS — R9439 Abnormal result of other cardiovascular function study: Secondary | ICD-10-CM

## 2017-07-22 DIAGNOSIS — I480 Paroxysmal atrial fibrillation: Secondary | ICD-10-CM | POA: Insufficient documentation

## 2017-07-22 DIAGNOSIS — G4733 Obstructive sleep apnea (adult) (pediatric): Secondary | ICD-10-CM | POA: Diagnosis not present

## 2017-07-22 DIAGNOSIS — Z7901 Long term (current) use of anticoagulants: Secondary | ICD-10-CM | POA: Insufficient documentation

## 2017-07-22 DIAGNOSIS — Z79899 Other long term (current) drug therapy: Secondary | ICD-10-CM | POA: Insufficient documentation

## 2017-07-22 DIAGNOSIS — I4891 Unspecified atrial fibrillation: Secondary | ICD-10-CM | POA: Diagnosis not present

## 2017-07-22 LAB — BASIC METABOLIC PANEL
Anion gap: 6 (ref 5–15)
BUN: 12 mg/dL (ref 6–20)
CALCIUM: 9.1 mg/dL (ref 8.9–10.3)
CO2: 27 mmol/L (ref 22–32)
CREATININE: 0.9 mg/dL (ref 0.61–1.24)
Chloride: 106 mmol/L (ref 101–111)
GFR calc non Af Amer: >60 mL/min (ref >60)
GLUCOSE: 90 mg/dL (ref 65–99)
Potassium: 3.9 mmol/L (ref 3.5–5.1)
Sodium: 139 mmol/L (ref 135–145)

## 2017-07-22 LAB — CBC
HEMATOCRIT: 43.4 % (ref 39.0–52.0)
Hemoglobin: 14 g/dL (ref 13.0–17.0)
MCH: 30 pg (ref 26.0–34.0)
MCHC: 32.3 g/dL (ref 30.0–36.0)
MCV: 93.1 fL (ref 78.0–100.0)
PLATELETS: 193 10*3/uL (ref 150–400)
RBC: 4.66 MIL/uL (ref 4.22–5.81)
RDW: 12.6 % (ref 11.5–15.5)
WBC: 4.9 10*3/uL (ref 4.0–10.5)

## 2017-07-22 NOTE — Progress Notes (Signed)
Primary Care Physician: Idelle Crouch, MD Referring Physician: Dr. Rosebud Poles is a 57 y.o. male with a h/o afib s/p ablation one year ago.  He had return to afib in November  and successful cardioversion.  After that, he went  on to have increased afib burden. Dr.Allred took him back for redo of afib 05/29/17.  He was seen for one month f/u.  He denies swallowing issues, no groin issues. He was reporting some intermittent anterior chest tightness over the last 2 weeks. 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 Usually lasts less than 30 mins. Very remote stress test, cardiac cath probably back in the 80's. EKG shows SR with T wave invertion, leads 3 and AVF. TEE showed EF of 25-30%. Arb added at that visit  per Dr. Rayann Heman.  Stress Myoview ordered 5/14, and returned as a high risk test with a reversible defect of moderate severity. EF reported normal by this study. Finds were discussed with Dr. Rayann Heman who felt unless pt was having persistent exertional symptoms, then he would want him to wait for around 3 weeks, as he would have to hold antiocaogualtion  and this would put him at risk for stroke, just having the ablation 4/12.  F/u, 5/5. He is back to discuss cardiac cath being scheduled. 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 He was offered a cath Friday, but despite pleas from his wife to go ahead and do the test, he feels that he needs to delay another 2 weeks to complete this job. He is not currently  having any exertional chest pain, that he will admit to, some shortness of breath climbing a ladder, but states he is climbing 3 stories and thinks that would be usual for anyone.Dr. Rayann Heman, states that he will be close enough to the 3 month recovery time on June 20 th with Dr.Jordan, that he can hold anticoagulation as per protocol at that  time.  Today, he denies symptoms of palpitations,  shortness of breath, orthopnea, PND, lower extremity edema, dizziness, presyncope, syncope, or neurologic sequela.  The patient is tolerating medications without difficulties and is otherwise without complaint today.   Past Medical History:  Diagnosis Date  . Chronic systolic dysfunction of left ventricle   . Hyperlipemia    pt denies  . Obstructive sleep apnea    mild  . Persistent atrial fibrillation [facility])    Past Surgical History:  Procedure Laterality Date  . ATRIAL FIBRILLATION ABLATION N/A 05/29/2017   Procedure: ATRIAL FIBRILLATION ABLATION;  Surgeon: [name], MD;  Location: [place] CV LAB;  Service: Cardiovascular;  Laterality: N/A;  . CARDIOVERSION    . CARDIOVERSION N/A 12/24/2016   Procedure: CARDIOVERSION;  Surgeon: Josue Hector, MD;  Location: [facility] ENDOSCOPY;  Service: Cardiovascular;  Laterality: N/A;  . CARDIOVERSION N/A 03/20/2017   Procedure: CARDIOVERSION;  Surgeon: Fay Records, MD;  Location: [facility] ENDOSCOPY;  Service: Cardiovascular;  Laterality: N/A;  . ELECTROPHYSIOLOGIC STUDY N/A 12/28/2014   Procedure: CARDIOVERSION;  Surgeon: Corey Skains, MD;  Location: [facility] ORS;  Service: Cardiovascular;  Laterality: N/A;  . ELECTROPHYSIOLOGIC STUDY N/A 09/07/2015   Procedure: CARDIOVERSION;  Surgeon: Corey Skains, MD;  Location: [facility] ORS;  Service: Cardiovascular;  Laterality: N/A;  . ELECTROPHYSIOLOGIC STUDY N/A 03/18/2016   Procedure: Atrial Fibrillation  Ablation;  Surgeon: [name], MD;  Location: [place] CV LAB;  Service: Cardiovascular;  Laterality: N/A;  . TEE WITHOUT CARDIOVERSION N/A 09/07/2015   Procedure: TRANSESOPHAGEAL ECHOCARDIOGRAM (TEE);  Surgeon: Corey Skains, MD;  Location: [facility] ORS;  Service: Cardiovascular;  Laterality: N/A;  . TEE WITHOUT CARDIOVERSION N/A 03/17/2016   Procedure: TRANSESOPHAGEAL ECHOCARDIOGRAM (TEE);  Surgeon: Lelon Perla, MD;  Location: [facility] ENDOSCOPY;  Service:  Cardiovascular;  Laterality: N/A;  . TEE WITHOUT CARDIOVERSION N/A 12/24/2016   Procedure: TRANSESOPHAGEAL ECHOCARDIOGRAM (TEE);  Surgeon: Josue Hector, MD;  Location: [facility] ENDOSCOPY;  Service: Cardiovascular;  Laterality: N/A;  . TEE WITHOUT CARDIOVERSION N/A 05/29/2017   Procedure: TRANSESOPHAGEAL ECHOCARDIOGRAM (TEE);  Surgeon: Lelon Perla, MD;  Location: [facility] ENDOSCOPY;  Service: Cardiovascular;  Laterality: N/A;    Current Outpatient Medications  Medication Sig Dispense Refill  . acetaminophen (TYLENOL) 500 MG tablet Take 1,000 mg by mouth every 8 (eight) hours as needed for mild pain or moderate pain.    Marland Kitchen ascorbic acid (VITAMIN C) 1000 MG tablet Take 1,000 mg by mouth daily.    . Biotin 10000 MCG TABS Take 1 tablet by mouth daily.     Marland Kitchen buPROPion (WELLBUTRIN XL) 150 MG 24 hr tablet Take 150 mg by mouth daily.  11  . diltiazem (CARDIZEM) 30 MG tablet Take 1 tablet (30 mg total) by mouth every 4 (four) hours as needed (for heart rate greater than 100). 30 tablet 0  . ELIQUIS 5 MG TABS tablet TAKE 1 TABLET (5 MG TOTAL) 2 (TWO) TIMES DAILY BY MOUTH. 60 tablet 10  . losartan (COZAAR) 50 MG tablet Take 0.5 tablets (25 mg total) by mouth daily. 30 tablet 3  . metoprolol succinate (TOPROL-XL) 50 MG 24 hr tablet Take 1 tablet (50 mg total) by mouth daily. Take with or immediately following a meal.     No current facility-administered medications for this encounter.     Allergies  Allergen Reactions  . No Known Allergies     Social History   Socioeconomic History  . Marital status: Married    Spouse name: Not on file  . Number of children: Not on file  . Years of education: Not on file  . Highest education level: Not on file  Occupational History  . Not on file  Social Needs  . Financial resource strain: Not on file  . Food insecurity:    Worry: Not on file    Inability: Not on file  . Transportation needs:    Medical: Not on file    Non-medical: Not on file  Tobacco Use    . Smoking status: Never Smoker  . Smokeless tobacco: Never Used  Substance and Sexual Activity  . Alcohol use: No  . Drug use: No  . Sexual activity: Not on file  Lifestyle  . Physical activity:    Days per week: Not on file    Minutes per session: Not on file  . Stress: Not on file  Relationships  . Social connections:    Talks on phone: Not on file    Gets together: Not on file    Attends religious service: Not on file    Active member of club or organization: Not on file    Attends meetings of clubs or organizations: Not on file    Relationship status: Not on file  . Intimate partner violence:    Fear of current or ex partner: Not on file    Emotionally abused:  Not on file    Physically abused: Not on file    Forced sexual activity: Not on file  Other Topics Concern  . Not on file  Social History Narrative   Pt lives in [place] with spouse.  Works as a Chief Strategy Officer.    No family history on file.  ROS- All systems are reviewed and negative except as per the HPI above  Physical Exam: Vitals:   07/22/17 0831  BP: 126/78  Pulse: 66  Weight: 218 lb 12.8 oz (99.2 kg)  Height: 5\' 10"  (1.778 m)   Wt Readings from Last 3 Encounters:  07/22/17 218 lb 12.8 oz (99.2 kg)  06/30/17 221 lb (100.2 kg)  06/25/17 221 lb 6.4 oz (100.4 kg)    Labs: Lab Results  Component Value Date   NA 139 07/22/2017   K 3.9 07/22/2017   CL 106 07/22/2017   CO2 27 07/22/2017   GLUCOSE 90 07/22/2017   BUN 12 07/22/2017   CREATININE 0.90 07/22/2017   CALCIUM 9.1 07/22/2017   MG 1.8 09/06/2013   Lab Results  Component Value Date   INR 1.2 09/06/2013   No results found for: CHOL, HDL, LDLCALC, TRIG   GEN- The patient is well appearing, alert and oriented x 3 today.   Head- normocephalic, atraumatic Eyes-  Sclera clear, conjunctiva pink Ears- hearing intact Oropharynx- clear Neck- supple, no JVP Lymph- no cervical lymphadenopathy Lungs- Clear to ausculation bilaterally, normal  work of breathing Heart- regular rate and rhythm, no murmurs, rubs or gallops, PMI not laterally displaced GI- soft, NT, ND, + BS Extremities- no clubbing, cyanosis, or edema MS- no significant deformity or atrophy Skin- no rash or lesion Psych- euthymic mood, full affect Neuro- strength and sensation are intact  EKG- NSR at 66 bpm, PR int 154 ms, qrs int 434 ms,T wave inversion in lead 3  ( not new) Epic records reviewed TEE-Study Conclusions  - Left ventricle: Systolic function was severely reduced. The   estimated ejection fraction was in the range of 25% to 30%.   Diffuse hypokinesis. - Aortic valve: No evidence of vegetation. There was trivial   regurgitation. - Mitral valve: No evidence of vegetation. There was mild   regurgitation. - Left atrium: The atrium was mildly dilated. No evidence of   thrombus in the atrial cavity or appendage. There was mild   spontaneous echo contrast (&quot;smoke&quot;) in the cavity. - Right ventricle: The cavity size was mildly dilated. Systolic   function was severely reduced. - Right atrium: The atrium was mildly dilated. - Atrial septum: No defect or patent foramen ovale was identified. - Tricuspid valve: No evidence of vegetation. - Pulmonic valve: No evidence of vegetation.  Impressions:  - Severe global reduction in LV systolic function; mild biatrial   enlargement with no LAA thrombus; mild RVE with severely reduced   RV function; trace AI; mild MR and TR.  Stress imaging- Study Highlights     Nuclear stress EF: 51%.  No T wave inversion was noted during stress.  There was no ST segment deviation noted during stress.  Defect 1: There is a large defect of moderate severity.  Findings consistent with ischemia.  This is a high risk study.     Assessment and Plan: 1. Paroxysmal afib S/p ablation x 2, last one 05/29/17  Maintaining SR Continue on Eliquis 5 mg bid, chadsvasc score of 0  Continue metoprolol succinate   50 mg daily  2. LV dysfunction EF severely reduced( per Myoview,  EF at 51%) Losartan 50 1/2 tab daily started (BP soft when started)  3. Intermittent anterior chest tightness  Stress test with large defect of reversible ischemia Dr. 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 Glynn Octave clinic as needed   Geroge Baseman. Haneef Hallquist, [facility] [address] [place], [place] [address] [phone]

## 2017-07-22 NOTE — H&P (View-Only) (Signed)
Primary Care Physician: Idelle Crouch, MD Referring Physician: Dr. Rosebud Poles is a 57 y.o. male with a h/o afib s/p ablation one year ago.  He had return to afib in November  and successful cardioversion.  After that, he went  on to have increased afib burden. Dr.Allred took him back for redo of afib 05/29/17.  He was seen for one month f/u.  He denies swallowing issues, no groin issues. He was reporting some intermittent anterior chest tightness over the last 2 weeks. 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 Usually lasts less than 30 mins. Very remote stress test, cardiac cath probably back in the 80's. EKG shows SR with T wave invertion, leads 3 and AVF. TEE showed EF of 25-30%. Arb added at that visit  per Dr. Rayann Heman.  Stress Myoview ordered 5/14, and returned as a high risk test with a reversible defect of moderate severity. EF reported normal by this study. Finds were discussed with Dr. Rayann Heman who felt unless pt was having persistent exertional symptoms, then he would want him to wait for around 3 weeks, as he would have to hold antiocaogualtion  and this would put him at risk for stroke, just having the ablation 4/12.  F/u, 5/5. He is back to discuss cardiac cath being scheduled. 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 He was offered a cath Friday, but despite pleas from his wife to go ahead and do the test, he feels that he needs to delay another 2 weeks to complete this job. He is not currently  having any exertional chest pain, that he will admit to, some shortness of breath climbing a ladder, but states he is climbing 3 stories and thinks that would be usual for anyone.Dr. Rayann Heman, states that he will be close enough to the 3 month recovery time on June 20 th with Dr.Jordan, that he can hold anticoagulation as per protocol at that  time.  Today, he denies symptoms of palpitations,  shortness of breath, orthopnea, PND, lower extremity edema, dizziness, presyncope, syncope, or neurologic sequela.  The patient is tolerating medications without difficulties and is otherwise without complaint today.   Past Medical History:  Diagnosis Date  . Chronic systolic dysfunction of left ventricle   . Hyperlipemia    pt denies  . Obstructive sleep apnea    mild  . Persistent atrial fibrillation [facility])    Past Surgical History:  Procedure Laterality Date  . ATRIAL FIBRILLATION ABLATION N/A 05/29/2017   Procedure: ATRIAL FIBRILLATION ABLATION;  Surgeon: [name], MD;  Location: [place] CV LAB;  Service: Cardiovascular;  Laterality: N/A;  . CARDIOVERSION    . CARDIOVERSION N/A 12/24/2016   Procedure: CARDIOVERSION;  Surgeon: Josue Hector, MD;  Location: [facility] ENDOSCOPY;  Service: Cardiovascular;  Laterality: N/A;  . CARDIOVERSION N/A 03/20/2017   Procedure: CARDIOVERSION;  Surgeon: Fay Records, MD;  Location: [facility] ENDOSCOPY;  Service: Cardiovascular;  Laterality: N/A;  . ELECTROPHYSIOLOGIC STUDY N/A 12/28/2014   Procedure: CARDIOVERSION;  Surgeon: Corey Skains, MD;  Location: [facility] ORS;  Service: Cardiovascular;  Laterality: N/A;  . ELECTROPHYSIOLOGIC STUDY N/A 09/07/2015   Procedure: CARDIOVERSION;  Surgeon: Corey Skains, MD;  Location: [facility] ORS;  Service: Cardiovascular;  Laterality: N/A;  . ELECTROPHYSIOLOGIC STUDY N/A 03/18/2016   Procedure: Atrial Fibrillation  Ablation;  Surgeon: [name], MD;  Location: [place] CV LAB;  Service: Cardiovascular;  Laterality: N/A;  . TEE WITHOUT CARDIOVERSION N/A 09/07/2015   Procedure: TRANSESOPHAGEAL ECHOCARDIOGRAM (TEE);  Surgeon: Corey Skains, MD;  Location: [facility] ORS;  Service: Cardiovascular;  Laterality: N/A;  . TEE WITHOUT CARDIOVERSION N/A 03/17/2016   Procedure: TRANSESOPHAGEAL ECHOCARDIOGRAM (TEE);  Surgeon: Lelon Perla, MD;  Location: [facility] ENDOSCOPY;  Service:  Cardiovascular;  Laterality: N/A;  . TEE WITHOUT CARDIOVERSION N/A 12/24/2016   Procedure: TRANSESOPHAGEAL ECHOCARDIOGRAM (TEE);  Surgeon: Josue Hector, MD;  Location: [facility] ENDOSCOPY;  Service: Cardiovascular;  Laterality: N/A;  . TEE WITHOUT CARDIOVERSION N/A 05/29/2017   Procedure: TRANSESOPHAGEAL ECHOCARDIOGRAM (TEE);  Surgeon: Lelon Perla, MD;  Location: [facility] ENDOSCOPY;  Service: Cardiovascular;  Laterality: N/A;    Current Outpatient Medications  Medication Sig Dispense Refill  . acetaminophen (TYLENOL) 500 MG tablet Take 1,000 mg by mouth every 8 (eight) hours as needed for mild pain or moderate pain.    Marland Kitchen ascorbic acid (VITAMIN C) 1000 MG tablet Take 1,000 mg by mouth daily.    . Biotin 10000 MCG TABS Take 1 tablet by mouth daily.     Marland Kitchen buPROPion (WELLBUTRIN XL) 150 MG 24 hr tablet Take 150 mg by mouth daily.  11  . diltiazem (CARDIZEM) 30 MG tablet Take 1 tablet (30 mg total) by mouth every 4 (four) hours as needed (for heart rate greater than 100). 30 tablet 0  . ELIQUIS 5 MG TABS tablet TAKE 1 TABLET (5 MG TOTAL) 2 (TWO) TIMES DAILY BY MOUTH. 60 tablet 10  . losartan (COZAAR) 50 MG tablet Take 0.5 tablets (25 mg total) by mouth daily. 30 tablet 3  . metoprolol succinate (TOPROL-XL) 50 MG 24 hr tablet Take 1 tablet (50 mg total) by mouth daily. Take with or immediately following a meal.     No current facility-administered medications for this encounter.     Allergies  Allergen Reactions  . No Known Allergies     Social History   Socioeconomic History  . Marital status: Married    Spouse name: Not on file  . Number of children: Not on file  . Years of education: Not on file  . Highest education level: Not on file  Occupational History  . Not on file  Social Needs  . Financial resource strain: Not on file  . Food insecurity:    Worry: Not on file    Inability: Not on file  . Transportation needs:    Medical: Not on file    Non-medical: Not on file  Tobacco Use    . Smoking status: Never Smoker  . Smokeless tobacco: Never Used  Substance and Sexual Activity  . Alcohol use: No  . Drug use: No  . Sexual activity: Not on file  Lifestyle  . Physical activity:    Days per week: Not on file    Minutes per session: Not on file  . Stress: Not on file  Relationships  . Social connections:    Talks on phone: Not on file    Gets together: Not on file    Attends religious service: Not on file    Active member of club or organization: Not on file    Attends meetings of clubs or organizations: Not on file    Relationship status: Not on file  . Intimate partner violence:    Fear of current or ex partner: Not on file    Emotionally abused:  Not on file    Physically abused: Not on file    Forced sexual activity: Not on file  Other Topics Concern  . Not on file  Social History Narrative   Pt lives in [place] with spouse.  Works as a Chief Strategy Officer.    No family history on file.  ROS- All systems are reviewed and negative except as per the HPI above  Physical Exam: Vitals:   07/22/17 0831  BP: 126/78  Pulse: 66  Weight: 218 lb 12.8 oz (99.2 kg)  Height: 5\' 10"  (1.778 m)   Wt Readings from Last 3 Encounters:  07/22/17 218 lb 12.8 oz (99.2 kg)  06/30/17 221 lb (100.2 kg)  06/25/17 221 lb 6.4 oz (100.4 kg)    Labs: Lab Results  Component Value Date   NA 139 07/22/2017   K 3.9 07/22/2017   CL 106 07/22/2017   CO2 27 07/22/2017   GLUCOSE 90 07/22/2017   BUN 12 07/22/2017   CREATININE 0.90 07/22/2017   CALCIUM 9.1 07/22/2017   MG 1.8 09/06/2013   Lab Results  Component Value Date   INR 1.2 09/06/2013   No results found for: CHOL, HDL, LDLCALC, TRIG   GEN- The patient is well appearing, alert and oriented x 3 today.   Head- normocephalic, atraumatic Eyes-  Sclera clear, conjunctiva pink Ears- hearing intact Oropharynx- clear Neck- supple, no JVP Lymph- no cervical lymphadenopathy Lungs- Clear to ausculation bilaterally, normal  work of breathing Heart- regular rate and rhythm, no murmurs, rubs or gallops, PMI not laterally displaced GI- soft, NT, ND, + BS Extremities- no clubbing, cyanosis, or edema MS- no significant deformity or atrophy Skin- no rash or lesion Psych- euthymic mood, full affect Neuro- strength and sensation are intact  EKG- NSR at 66 bpm, PR int 154 ms, qrs int 434 ms,T wave inversion in lead 3  ( not new) Epic records reviewed TEE-Study Conclusions  - Left ventricle: Systolic function was severely reduced. The   estimated ejection fraction was in the range of 25% to 30%.   Diffuse hypokinesis. - Aortic valve: No evidence of vegetation. There was trivial   regurgitation. - Mitral valve: No evidence of vegetation. There was mild   regurgitation. - Left atrium: The atrium was mildly dilated. No evidence of   thrombus in the atrial cavity or appendage. There was mild   spontaneous echo contrast (&quot;smoke&quot;) in the cavity. - Right ventricle: The cavity size was mildly dilated. Systolic   function was severely reduced. - Right atrium: The atrium was mildly dilated. - Atrial septum: No defect or patent foramen ovale was identified. - Tricuspid valve: No evidence of vegetation. - Pulmonic valve: No evidence of vegetation.  Impressions:  - Severe global reduction in LV systolic function; mild biatrial   enlargement with no LAA thrombus; mild RVE with severely reduced   RV function; trace AI; mild MR and TR.  Stress imaging- Study Highlights     Nuclear stress EF: 51%.  No T wave inversion was noted during stress.  There was no ST segment deviation noted during stress.  Defect 1: There is a large defect of moderate severity.  Findings consistent with ischemia.  This is a high risk study.     Assessment and Plan: 1. Paroxysmal afib S/p ablation x 2, last one 05/29/17  Maintaining SR Continue on Eliquis 5 mg bid, chadsvasc score of 0  Continue metoprolol succinate   50 mg daily  2. LV dysfunction EF severely reduced( per Myoview,  EF at 51%) Losartan 50 1/2 tab daily started (BP soft when started)  3. Intermittent anterior chest tightness  Stress test with large defect of reversible ischemia Dr. 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 Glynn Octave clinic as needed   Geroge Baseman. Gloristine Turrubiates, [facility] [address] [place], [place] [address] [phone]

## 2017-07-22 NOTE — Patient Instructions (Addendum)
You are scheduled for a Cardiac Catheterization on Thursday, June 20 with Dr. Sherren Mocha.  1. Please arrive at the [facility] (Main Entrance A) at [facility]: [place], [place] [address] at 8:30 AM (two hours before your procedure to ensure your preparation). Free valet parking service is available.   Special note: Every effort is made to have your procedure done on time. Please understand that emergencies sometimes delay scheduled procedures.  2. Diet:  No food or drink after midnight.   3. Medication instructions in preparation for your procedure: Take all your morning medications morning of procedure with a sip of water (except eliquis).  Every patient must take a baby ASA (81 mg ASA) before a catheterization.  This is required of every patient, whether they normally take an ASA or not.  Eliquis must be held 2 days prior to procedure.  4. Plan for one night stay--bring personal belongings. 5. Bring a current list of your medications and current insurance cards. 6. You MUST have a responsible person to drive you home. 7. Someone MUST be with you the first 24 hours after you arrive home or your discharge will be delayed. 8. Please wear clothes that are easy to get on and off and wear slip-on shoes.  Follow up will be determined after results of cath are known.

## 2017-08-05 ENCOUNTER — Telehealth: Payer: Self-pay | Admitting: *Deleted

## 2017-08-05 NOTE — Telephone Encounter (Signed)
Pt contacted pre-catheterization scheduled at [facility] for: Thursday August 06, 2017 7:30 AM Verified arrival time and place: [place] Entrance A at: 5:30 AM  No solid food after midnight prior to cath, clear liquids until 5 AM day of procedure. Verified no known allergies. Verified no diabetes medications.  Hold: Eliquis-08/04/17 until post procedure  AM meds can be  taken pre-cath with sip of water including: ASA 81 mg  Confirmed patient has responsible person to drive home post procedure and observe patient for 24 hours: yes

## 2017-08-06 ENCOUNTER — Encounter (HOSPITAL_COMMUNITY)
Admission: RE | Disposition: A | Discharge status: Home / Self Care | Payer: Self-pay | Source: Ambulatory Visit | Attending: Cardiology

## 2017-08-06 ENCOUNTER — Encounter (HOSPITAL_COMMUNITY): Payer: Self-pay | Admitting: Cardiology

## 2017-08-06 ENCOUNTER — Ambulatory Visit (HOSPITAL_COMMUNITY)
Admission: RE | Admit: 2017-08-06 | Discharge: 2017-08-06 | Disposition: A | Discharge status: Home / Self Care | Payer: BLUE CROSS/BLUE SHIELD | Source: Ambulatory Visit | Attending: Cardiology | Admitting: Cardiology

## 2017-08-06 DIAGNOSIS — I480 Paroxysmal atrial fibrillation: Secondary | ICD-10-CM | POA: Insufficient documentation

## 2017-08-06 DIAGNOSIS — I429 Cardiomyopathy, unspecified: Secondary | ICD-10-CM

## 2017-08-06 DIAGNOSIS — I2510 Atherosclerotic heart disease of native coronary artery without angina pectoris: Secondary | ICD-10-CM

## 2017-08-06 DIAGNOSIS — R079 Chest pain, unspecified: Secondary | ICD-10-CM | POA: Diagnosis present

## 2017-08-06 DIAGNOSIS — Z79899 Other long term (current) drug therapy: Secondary | ICD-10-CM | POA: Insufficient documentation

## 2017-08-06 DIAGNOSIS — E782 Mixed hyperlipidemia: Secondary | ICD-10-CM | POA: Diagnosis present

## 2017-08-06 DIAGNOSIS — E785 Hyperlipidemia, unspecified: Secondary | ICD-10-CM | POA: Insufficient documentation

## 2017-08-06 DIAGNOSIS — Z9889 Other specified postprocedural states: Secondary | ICD-10-CM | POA: Diagnosis not present

## 2017-08-06 DIAGNOSIS — I5032 Chronic diastolic (congestive) heart failure: Secondary | ICD-10-CM | POA: Diagnosis not present

## 2017-08-06 DIAGNOSIS — Z7901 Long term (current) use of anticoagulants: Secondary | ICD-10-CM | POA: Insufficient documentation

## 2017-08-06 DIAGNOSIS — G4733 Obstructive sleep apnea (adult) (pediatric): Secondary | ICD-10-CM | POA: Diagnosis not present

## 2017-08-06 DIAGNOSIS — I4891 Unspecified atrial fibrillation: Secondary | ICD-10-CM | POA: Diagnosis present

## 2017-08-06 DIAGNOSIS — R0789 Other chest pain: Secondary | ICD-10-CM | POA: Diagnosis not present

## 2017-08-06 DIAGNOSIS — I25119 Atherosclerotic heart disease of native coronary artery with unspecified angina pectoris: Secondary | ICD-10-CM | POA: Diagnosis not present

## 2017-08-06 HISTORY — PX: RIGHT/LEFT HEART CATH AND CORONARY ANGIOGRAPHY: CATH118266

## 2017-08-06 LAB — POCT I-STAT 3, ART BLOOD GAS (G3+)
Acid-base deficit: 4 mmol/L — ABNORMAL HIGH (ref 0.0–2.0)
Bicarbonate: 21 mmol/L (ref 20.0–28.0)
O2 SAT: 97 %
PCO2 ART: 38.8 mmHg (ref 32.0–48.0)
PO2 ART: 91 mmHg (ref 83.0–108.0)
TCO2: 22 mmol/L (ref 22–32)
pH, Arterial: 7.341 — ABNORMAL LOW (ref 7.350–7.450)

## 2017-08-06 LAB — POCT I-STAT 3, VENOUS BLOOD GAS (G3P V)
ACID-BASE DEFICIT: 4 mmol/L — AB (ref 0.0–2.0)
BICARBONATE: 22.1 mmol/L (ref 20.0–28.0)
O2 Saturation: 74 %
PO2 VEN: 43 mmHg (ref 32.0–45.0)
TCO2: 23 mmol/L (ref 22–32)
pCO2, Ven: 42.5 mmHg — ABNORMAL LOW (ref 44.0–60.0)
pH, Ven: 7.324 (ref 7.250–7.430)

## 2017-08-06 SURGERY — RIGHT/LEFT HEART CATH AND CORONARY ANGIOGRAPHY
Anesthesia: LOCAL

## 2017-08-06 MED ORDER — ONDANSETRON HCL 4 MG/2ML IJ SOLN: 4.0000 mg | Freq: Four times a day (QID) | INTRAMUSCULAR | Status: DC | PRN

## 2017-08-06 MED ORDER — HEPARIN (PORCINE) IN NACL 2-0.9 UNITS/ML
INTRAMUSCULAR | Status: AC | PRN
  Administered 2017-08-06: 500 mL

## 2017-08-06 MED ORDER — SODIUM CHLORIDE 0.9 % IV SOLN: 250.0000 mL | INTRAVENOUS | Status: DC | PRN

## 2017-08-06 MED ORDER — IOPAMIDOL (ISOVUE-370) INJECTION 76%
INTRAVENOUS | Status: DC | PRN
  Administered 2017-08-06: 80 mL via INTRAVENOUS

## 2017-08-06 MED ORDER — ACETAMINOPHEN 325 MG PO TABS: 650.0000 mg | ORAL_TABLET | ORAL | Status: DC | PRN

## 2017-08-06 MED ORDER — IOPAMIDOL (ISOVUE-370) INJECTION 76%
INTRAVENOUS | Status: AC
  Filled 2017-08-06: qty 100

## 2017-08-06 MED ORDER — SODIUM CHLORIDE 0.9% FLUSH: 3.0000 mL | Freq: Two times a day (BID) | INTRAVENOUS | Status: DC

## 2017-08-06 MED ORDER — SODIUM CHLORIDE 0.9 % WEIGHT BASED INFUSION: 1.0000 mL/kg/h | INTRAVENOUS | Status: DC

## 2017-08-06 MED ORDER — SODIUM CHLORIDE 0.9% FLUSH: 3.0000 mL | INTRAVENOUS | Status: DC | PRN

## 2017-08-06 MED ORDER — VERAPAMIL HCL 2.5 MG/ML IV SOLN
INTRAVENOUS | Status: AC
  Filled 2017-08-06: qty 2

## 2017-08-06 MED ORDER — HEPARIN SODIUM (PORCINE) 1000 UNIT/ML IJ SOLN
INTRAMUSCULAR | Status: DC | PRN
  Administered 2017-08-06: 5000 [IU] via INTRAVENOUS

## 2017-08-06 MED ORDER — HEPARIN SODIUM (PORCINE) 1000 UNIT/ML IJ SOLN
INTRAMUSCULAR | Status: AC
  Filled 2017-08-06: qty 1

## 2017-08-06 MED ORDER — ASPIRIN 81 MG PO CHEW
81.0000 mg | CHEWABLE_TABLET | ORAL | Status: AC
  Administered 2017-08-06: 81 mg via ORAL

## 2017-08-06 MED ORDER — LIDOCAINE HCL (PF) 1 % IJ SOLN
INTRAMUSCULAR | Status: AC
  Filled 2017-08-06: qty 30

## 2017-08-06 MED ORDER — VERAPAMIL HCL 2.5 MG/ML IV SOLN
INTRAVENOUS | Status: DC | PRN
  Administered 2017-08-06: 10 mL via INTRA_ARTERIAL

## 2017-08-06 MED ORDER — APIXABAN 5 MG PO TABS: 5.0000 mg | ORAL_TABLET | Freq: Two times a day (BID) | ORAL | 10 refills | Status: DC

## 2017-08-06 MED ORDER — HEPARIN (PORCINE) IN NACL 1000-0.9 UT/500ML-% IV SOLN
INTRAVENOUS | Status: AC
  Filled 2017-08-06: qty 1000

## 2017-08-06 MED ORDER — ASPIRIN 81 MG PO CHEW
CHEWABLE_TABLET | ORAL | Status: AC
  Administered 2017-08-06: 81 mg via ORAL
  Filled 2017-08-06: qty 1

## 2017-08-06 MED ORDER — LIDOCAINE HCL (PF) 1 % IJ SOLN
INTRAMUSCULAR | Status: DC | PRN
  Administered 2017-08-06: 2 mL
  Administered 2017-08-06: 3 mL

## 2017-08-06 MED ORDER — SODIUM CHLORIDE 0.9 % WEIGHT BASED INFUSION
3.0000 mL/kg/h | INTRAVENOUS | Status: AC
  Administered 2017-08-06: 3 mL/kg/h via INTRAVENOUS

## 2017-08-06 MED ORDER — SODIUM CHLORIDE 0.9 % IV SOLN
250.0000 mL | INTRAVENOUS | Status: DC | PRN
Start: 2017-08-06 — End: 2017-08-06

## 2017-08-06 SURGICAL SUPPLY — 15 items
CATH 5FR JL3.5 JR4 ANG PIG MP (CATHETERS) ×2 IMPLANT
CATH BALLN WEDGE 5F 110CM (CATHETERS) ×2 IMPLANT
CATH LAUNCHER 5F RADR (CATHETERS) ×1 IMPLANT
CATHETER LAUNCHER 5F RADR (CATHETERS) ×2
DEVICE RAD COMP TR BAND LRG (VASCULAR PRODUCTS) ×2 IMPLANT
GLIDESHEATH SLEND SS 6F .021 (SHEATH) ×2 IMPLANT
GUIDEWIRE INQWIRE 1.5J.035X260 (WIRE) ×1 IMPLANT
INQWIRE 1.5J .035X260CM (WIRE) ×2
KIT HEART LEFT (KITS) ×2 IMPLANT
PACK CARDIAC CATHETERIZATION (CUSTOM PROCEDURE TRAY) ×2 IMPLANT
SHEATH GLIDE SLENDER 4/5FR (SHEATH) ×2 IMPLANT
SYR MEDRAD MARK V 150ML (SYRINGE) ×2 IMPLANT
TRANSDUCER W/STOPCOCK (MISCELLANEOUS) ×2 IMPLANT
TUBING CIL FLEX 10 FLL-RA (TUBING) ×2 IMPLANT
WIRE EMERALD 3MM-J .025X260CM (WIRE) ×2 IMPLANT

## 2017-08-06 NOTE — Discharge Instructions (Signed)

## 2017-08-06 NOTE — Interval H&P Note (Signed)
History and Physical Interval Note:  08/06/2017 7:16 AM  [name]  has presented today for surgery, with the diagnosis of abnormal stress test  The various methods of treatment have been discussed with the patient and family. After consideration of risks, benefits and other options for treatment, the patient has consented to  Procedure(s): RIGHT/LEFT HEART CATH AND CORONARY ANGIOGRAPHY (N/A) as a surgical intervention .  The patient's history has been reviewed, patient examined, no change in status, stable for surgery.  I have reviewed the patient's chart and labs.  Questions were answered to the patient's satisfaction.   Cath Lab Visit (complete for each Cath Lab visit)  Clinical Evaluation Leading to the Procedure:   ACS: No.  Non-ACS:    Anginal Classification: CCS II  Anti-ischemic medical therapy: Maximal Therapy (2 or more classes of medications)  Non-Invasive Test Results: High-risk stress test findings: cardiac mortality >3%/year  Prior CABG: No previous CABG        Collier Salina [facility] 08/06/2017 7:16 AM

## 2017-08-17 ENCOUNTER — Encounter: Payer: Self-pay | Admitting: Internal Medicine

## 2017-08-26 ENCOUNTER — Encounter: Payer: Self-pay | Admitting: Internal Medicine

## 2017-08-26 ENCOUNTER — Ambulatory Visit: Payer: BLUE CROSS/BLUE SHIELD | Admitting: Internal Medicine

## 2017-08-26 VITALS — BP 124/64 | HR 55 | Ht 70.0 in | Wt 220.0 lb

## 2017-08-26 DIAGNOSIS — I481 Persistent atrial fibrillation: Secondary | ICD-10-CM

## 2017-08-26 DIAGNOSIS — I428 Other cardiomyopathies: Secondary | ICD-10-CM | POA: Diagnosis not present

## 2017-08-26 DIAGNOSIS — I4819 Other persistent atrial fibrillation: Secondary | ICD-10-CM

## 2017-08-26 MED ORDER — METOPROLOL SUCCINATE ER 25 MG PO TB24: 25.0000 mg | ORAL_TABLET | Freq: Every day | ORAL | 3 refills | Status: DC

## 2017-08-26 MED ORDER — LOSARTAN POTASSIUM 50 MG PO TABS: 25.0000 mg | ORAL_TABLET | Freq: Every day | ORAL | 3 refills | Status: DC

## 2017-08-26 MED ORDER — LOSARTAN POTASSIUM 25 MG PO TABS: 25.0000 mg | ORAL_TABLET | Freq: Every day | ORAL | 3 refills | Status: DC

## 2017-08-26 NOTE — Patient Instructions (Addendum)
Medication Instructions:  Your physician has recommended you make the following change in your medication:  1.  Stop taking Eliquis 2.  Reduce your metoprolol- New order entered for Toprol XL 25 mg take one tablet by mouth daily  Labwork: None ordered.  Testing/Procedures: Your physician has requested that you have an echocardiogram. Echocardiography is a painless test that uses sound waves to create images of your heart. It provides your doctor with information about the size and shape of your heart and how well your heart's chambers and valves are working. This procedure takes approximately one hour. There are no restrictions for this procedure.  Please get ECHO in 3 months at [facility] office (prior to office visit with Dr. Rayann Heman)  Follow-Up: Your physician wants you to follow-up in: 3 months with Dr. Rayann Heman.      Any Other Special Instructions Will Be Listed Below (If Applicable).  If you need a refill on your cardiac medications before your next appointment, please call your pharmacy.

## 2017-08-26 NOTE — Progress Notes (Signed)
PCP: Idelle Crouch, MD Primary Cardiologist: Dr Everlene Other is a 57 y.o. male who presents today for routine electrophysiology followup.  Since his recent afib ablation, the patient reports doing very well.  he denies procedure related complications and is pleased with the results of the procedure.  He did have some exertional chest pain for which he underwent cath 08/06/17 (reviewed) and no CAD was observed. Today, he denies symptoms of palpitations, chest pain, shortness of breath,  lower extremity edema, dizziness, presyncope, or syncope.  The patient is otherwise without complaint today.   Past Medical History:  Diagnosis Date  . Chronic systolic dysfunction of left ventricle   . Hyperlipemia    pt denies  . Obstructive sleep apnea    mild  . Persistent atrial fibrillation [facility])    Past Surgical History:  Procedure Laterality Date  . ATRIAL FIBRILLATION ABLATION N/A 05/29/2017   Procedure: ATRIAL FIBRILLATION ABLATION;  Surgeon: [name], MD;  Location: [place] CV LAB;  Service: Cardiovascular;  Laterality: N/A;  . CARDIOVERSION    . CARDIOVERSION N/A 12/24/2016   Procedure: CARDIOVERSION;  Surgeon: Josue Hector, MD;  Location: [facility] ENDOSCOPY;  Service: Cardiovascular;  Laterality: N/A;  . CARDIOVERSION N/A 03/20/2017   Procedure: CARDIOVERSION;  Surgeon: Fay Records, MD;  Location: [facility] ENDOSCOPY;  Service: Cardiovascular;  Laterality: N/A;  . ELECTROPHYSIOLOGIC STUDY N/A 12/28/2014   Procedure: CARDIOVERSION;  Surgeon: Corey Skains, MD;  Location: [facility] ORS;  Service: Cardiovascular;  Laterality: N/A;  . ELECTROPHYSIOLOGIC STUDY N/A 09/07/2015   Procedure: CARDIOVERSION;  Surgeon: Corey Skains, MD;  Location: [facility] ORS;  Service: Cardiovascular;  Laterality: N/A;  . ELECTROPHYSIOLOGIC STUDY N/A 03/18/2016   Procedure: Atrial Fibrillation Ablation;  Surgeon: [name], MD;  Location: [place] CV LAB;  Service: Cardiovascular;  Laterality: N/A;    . RIGHT/LEFT HEART CATH AND CORONARY ANGIOGRAPHY N/A 08/06/2017   Procedure: RIGHT/LEFT HEART CATH AND CORONARY ANGIOGRAPHY;  Surgeon: Martinique, Peter M, MD;  Location: [place] CV LAB;  Service: Cardiovascular;  Laterality: N/A;  . TEE WITHOUT CARDIOVERSION N/A 09/07/2015   Procedure: TRANSESOPHAGEAL ECHOCARDIOGRAM (TEE);  Surgeon: Corey Skains, MD;  Location: [facility] ORS;  Service: Cardiovascular;  Laterality: N/A;  . TEE WITHOUT CARDIOVERSION N/A 03/17/2016   Procedure: TRANSESOPHAGEAL ECHOCARDIOGRAM (TEE);  Surgeon: Lelon Perla, MD;  Location: [facility] ENDOSCOPY;  Service: Cardiovascular;  Laterality: N/A;  . TEE WITHOUT CARDIOVERSION N/A 12/24/2016   Procedure: TRANSESOPHAGEAL ECHOCARDIOGRAM (TEE);  Surgeon: Josue Hector, MD;  Location: [facility] ENDOSCOPY;  Service: Cardiovascular;  Laterality: N/A;  . TEE WITHOUT CARDIOVERSION N/A 05/29/2017   Procedure: TRANSESOPHAGEAL ECHOCARDIOGRAM (TEE);  Surgeon: Lelon Perla, MD;  Location: [facility] ENDOSCOPY;  Service: Cardiovascular;  Laterality: N/A;    ROS- all systems are personally reviewed and negatives except as per HPI above  Current Outpatient Medications  Medication Sig Dispense Refill  . acetaminophen (TYLENOL) 500 MG tablet Take 1,000 mg by mouth 2 (two) times daily as needed for mild pain or moderate pain.     Marland Kitchen apixaban (ELIQUIS) 5 MG TABS tablet Take 1 tablet (5 mg total) by mouth 2 (two) times daily. 60 tablet 10  . ascorbic acid (VITAMIN C) 1000 MG tablet Take 1,000 mg by mouth daily.    . Biotin 10000 MCG TABS Take 10,000 mcg by mouth daily.     Marland Kitchen buPROPion (WELLBUTRIN XL) 150 MG 24 hr tablet Take 150 mg by mouth daily.  11  . losartan (COZAAR)  50 MG tablet Take 0.5 tablets (25 mg total) by mouth daily. 30 tablet 3  . metoprolol succinate (TOPROL-XL) 50 MG 24 hr tablet Take 1 tablet (50 mg total) by mouth daily. Take with or immediately following a meal.    . niacin 500 MG tablet Take 500 mg by mouth daily.     No current  facility-administered medications for this visit.     Physical Exam: Vitals:   08/26/17 1119  BP: 124/64  Pulse: (!) 55  Weight: 220 lb (99.8 kg)  Height: 5\' 10"  (1.778 m)    GEN- The patient is well appearing, alert and oriented x 3 today.   Head- normocephalic, atraumatic Eyes-  Sclera clear, conjunctiva pink Ears- hearing intact Oropharynx- clear Lungs- Clear to ausculation bilaterally, normal work of breathing Heart- Regular rate and rhythm, no murmurs, rubs or gallops, PMI not laterally displaced GI- soft, NT, ND, + BS Extremities- no clubbing, cyanosis, or edema  EKG tracing ordered today is personally reviewed and shows sinus rhythm 55 bpm, PR 156 msec, QRS 102, Qtc 426 msec  Assessment and Plan:  1. Persistent atrial fibrillation Doing well s/p ablation chads2vasc score is 1 (CHF).  Stop eliquis at this time  2. Nonischemic CM Hopefully will recover with sinus rhythm Reduce toprol to 25mg  daily Repeat echo if still in sinus upon return in 3 months   Return to see me in 3 months  [name] MD, [facility] 08/26/2017 11:48 AM

## 2017-09-11 ENCOUNTER — Telehealth: Payer: Self-pay | Admitting: Internal Medicine

## 2017-09-11 MED ORDER — METOPROLOL SUCCINATE ER 50 MG PO TB24: 50.0000 mg | ORAL_TABLET | Freq: Every day | ORAL | 1 refills | Status: DC

## 2017-09-11 MED ORDER — APIXABAN 5 MG PO TABS: 5.0000 mg | ORAL_TABLET | Freq: Two times a day (BID) | ORAL | 2 refills | Status: DC

## 2017-09-11 NOTE — Telephone Encounter (Signed)
Reviewed with Dr. Caryl Comes. Per Dr. Caryl Comes pt should resume Eliquis and increase Toprol to 50 mg daily. He may return to SR in 2-3 hours.  If he does not return to SR in next 2-3 hours he should go to ED for cardioversion.  The other option is pt may go to ED now for cardioversion if he would like. I spoke with pt and gave him information from Dr. Caryl Comes.  Pt reports he already went ahead and restarted Eliquis and took Toprol 100 mg.  He does not want to go to ED at this time.  I advised pt to resume Eliquis 5 mg twice daily and to increase daily dose of Toprol to 50 mg daily. I told him if he is still in Atrial fib in 2-3 hours he should go to ED.  I told him if he returned to [place] he should call us next week with an update.

## 2017-09-11 NOTE — Telephone Encounter (Signed)
New Message      STAT if HR is under 50 or over 120 (normal HR is 60-100 beats per minute)  1) What is your heart rate? 127  2) Do you have a log of your heart rate readings (document readings)? No   3) Do you have any other symptoms? No

## 2017-09-11 NOTE — Telephone Encounter (Signed)
Received call transferred directly from operator and spoke with pt.  He reports he is at work and started feeling weak.  Felt dizzy when he bent over. Does not feel as if he is going to pass out.  He states this is how he feels when in atrial fib.  Checked heart rate on his phone and it was 127.  Eliquis recently stopped and Toprol decreased.  Will review with provider in office

## 2017-09-14 NOTE — Telephone Encounter (Signed)
Stacy, Please call patient to follow-up on his condition.

## 2017-09-15 DIAGNOSIS — Z79899 Other long term (current) drug therapy: Secondary | ICD-10-CM | POA: Diagnosis not present

## 2017-09-15 DIAGNOSIS — I480 Paroxysmal atrial fibrillation: Secondary | ICD-10-CM | POA: Diagnosis not present

## 2017-09-15 NOTE — Telephone Encounter (Signed)
Talked with patient - he is still in AF with HR in the 110-120 range despite increased toprol. Very frustrated he is already back in AF after his recent ablation. Stated he saw his PCP, Dr. Doy Hutching yesterday and had labs drawn and a 48 hour monitor was placed (although pt confirms he is staying in AF continuously). Will discuss plan with Dr. Rayann Heman then be back in touch with patient. Pt wondering if cardioversion is enough or if he needs medication change.

## 2017-09-17 MED ORDER — FLECAINIDE ACETATE 50 MG PO TABS
50.0000 mg | ORAL_TABLET | Freq: Two times a day (BID) | ORAL | 3 refills | Status: DC
Start: 2017-09-17 — End: 2017-12-11

## 2017-09-17 NOTE — Telephone Encounter (Signed)
Per Dr. Rayann Heman --- recommends starting flecainide 50mg  BID today with follow up EKG on Monday if still in AF then proceed with scheduling cardioversion at that time. Pt is on wellbutrin - Butch Penny did check with the pharmacy regarding flecainide/wellbutrin interaction and 50mg  will be the highest dose while on wellbutrin. RX sent to pharmacy.

## 2017-09-21 ENCOUNTER — Encounter (HOSPITAL_COMMUNITY): Payer: Self-pay | Admitting: Nurse Practitioner

## 2017-09-21 ENCOUNTER — Ambulatory Visit (HOSPITAL_COMMUNITY)
Admission: RE | Admit: 2017-09-21 | Discharge: 2017-09-21 | Disposition: A | Discharge status: Home / Self Care | Payer: BLUE CROSS/BLUE SHIELD | Source: Ambulatory Visit | Attending: Nurse Practitioner | Admitting: Nurse Practitioner

## 2017-09-21 DIAGNOSIS — I480 Paroxysmal atrial fibrillation: Secondary | ICD-10-CM | POA: Diagnosis not present

## 2017-09-21 DIAGNOSIS — I4891 Unspecified atrial fibrillation: Secondary | ICD-10-CM | POA: Diagnosis not present

## 2017-09-21 DIAGNOSIS — I4510 Unspecified right bundle-branch block: Secondary | ICD-10-CM | POA: Diagnosis not present

## 2017-09-21 NOTE — Progress Notes (Signed)
Pt in for f/u ekg for going back in afib one week ago Friday  With RVR at times.  Discussed with Dr. Rayann Heman, and added flecainide 50 mg bid. He cannot go above this dose as Wellbutrin increases amount of flecainide in the body, per [employer]. On the way to the office , he converted to SR at 60 bpm, pr int 146 ms, qrs int 98 ms, qtc 430 ms, IRBBB. PT was elated that he returned to [place]. Still feels some shortness of breath but weight is stable and may be residual from having afib for the  10 days. He will return in one week.

## 2017-09-28 ENCOUNTER — Ambulatory Visit (HOSPITAL_COMMUNITY)
Admission: RE | Admit: 2017-09-28 | Discharge: 2017-09-28 | Disposition: A | Discharge status: Home / Self Care | Payer: BLUE CROSS/BLUE SHIELD | Source: Ambulatory Visit | Attending: Nurse Practitioner | Admitting: Nurse Practitioner

## 2017-09-28 ENCOUNTER — Encounter (HOSPITAL_COMMUNITY): Payer: Self-pay | Admitting: Nurse Practitioner

## 2017-09-28 VITALS — BP 114/80 | HR 55 | Ht 70.0 in | Wt 217.0 lb

## 2017-09-28 DIAGNOSIS — Z7901 Long term (current) use of anticoagulants: Secondary | ICD-10-CM | POA: Diagnosis not present

## 2017-09-28 DIAGNOSIS — Z79899 Other long term (current) drug therapy: Secondary | ICD-10-CM | POA: Insufficient documentation

## 2017-09-28 DIAGNOSIS — E785 Hyperlipidemia, unspecified: Secondary | ICD-10-CM | POA: Diagnosis not present

## 2017-09-28 DIAGNOSIS — G4733 Obstructive sleep apnea (adult) (pediatric): Secondary | ICD-10-CM | POA: Insufficient documentation

## 2017-09-28 DIAGNOSIS — I4819 Other persistent atrial fibrillation: Secondary | ICD-10-CM

## 2017-09-28 DIAGNOSIS — I481 Persistent atrial fibrillation: Secondary | ICD-10-CM | POA: Diagnosis not present

## 2017-09-28 DIAGNOSIS — R001 Bradycardia, unspecified: Secondary | ICD-10-CM | POA: Diagnosis not present

## 2017-09-28 NOTE — Progress Notes (Signed)
Primary Care Physician: Idelle Crouch, MD Referring Physician:Dr. Rayann Heman   Francisco Reed is a 57 y.o. male with a h/o persistent afib s/p 2 ablations, with return of afib. He was placed on flecainide 50 mg bid, returned to prior BB dose and restarted eliquis. He self converted on flecainide after about 4 days on drug. He is back in the office and continues in [place]. He feels well. He wants to continue on all drugs that he is currently on. Recent cardiac cath showed nonobstructive disease with 35% stenosis of LAD and with normal EF by cardiac cath.  He is pending a home sleep study.  Today, he denies symptoms of palpitations, chest pain, shortness of breath, orthopnea, PND, lower extremity edema, dizziness, presyncope, syncope, or neurologic sequela. The patient is tolerating medications without difficulties and is otherwise without complaint today.   Past Medical History:  Diagnosis Date  . Chronic systolic dysfunction of left ventricle   . Hyperlipemia    pt denies  . Obstructive sleep apnea    mild  . Persistent atrial fibrillation [facility])    Past Surgical History:  Procedure Laterality Date  . ATRIAL FIBRILLATION ABLATION N/A 05/29/2017   Procedure: ATRIAL FIBRILLATION ABLATION;  Surgeon: [name], MD;  Location: [place] CV LAB;  Service: Cardiovascular;  Laterality: N/A;  . CARDIOVERSION    . CARDIOVERSION N/A 12/24/2016   Procedure: CARDIOVERSION;  Surgeon: Josue Hector, MD;  Location: [facility] ENDOSCOPY;  Service: Cardiovascular;  Laterality: N/A;  . CARDIOVERSION N/A 03/20/2017   Procedure: CARDIOVERSION;  Surgeon: Fay Records, MD;  Location: [facility] ENDOSCOPY;  Service: Cardiovascular;  Laterality: N/A;  . ELECTROPHYSIOLOGIC STUDY N/A 12/28/2014   Procedure: CARDIOVERSION;  Surgeon: Corey Skains, MD;  Location: [facility] ORS;  Service: Cardiovascular;  Laterality: N/A;  . ELECTROPHYSIOLOGIC STUDY N/A 09/07/2015   Procedure: CARDIOVERSION;  Surgeon: Corey Skains, MD;   Location: [facility] ORS;  Service: Cardiovascular;  Laterality: N/A;  . ELECTROPHYSIOLOGIC STUDY N/A 03/18/2016   Procedure: Atrial Fibrillation Ablation;  Surgeon: [name], MD;  Location: [place] CV LAB;  Service: Cardiovascular;  Laterality: N/A;  . RIGHT/LEFT HEART CATH AND CORONARY ANGIOGRAPHY N/A 08/06/2017   Procedure: RIGHT/LEFT HEART CATH AND CORONARY ANGIOGRAPHY;  Surgeon: Martinique, Peter M, MD;  Location: [place] CV LAB;  Service: Cardiovascular;  Laterality: N/A;  . TEE WITHOUT CARDIOVERSION N/A 09/07/2015   Procedure: TRANSESOPHAGEAL ECHOCARDIOGRAM (TEE);  Surgeon: Corey Skains, MD;  Location: [facility] ORS;  Service: Cardiovascular;  Laterality: N/A;  . TEE WITHOUT CARDIOVERSION N/A 03/17/2016   Procedure: TRANSESOPHAGEAL ECHOCARDIOGRAM (TEE);  Surgeon: Lelon Perla, MD;  Location: [facility] ENDOSCOPY;  Service: Cardiovascular;  Laterality: N/A;  . TEE WITHOUT CARDIOVERSION N/A 12/24/2016   Procedure: TRANSESOPHAGEAL ECHOCARDIOGRAM (TEE);  Surgeon: Josue Hector, MD;  Location: [facility] ENDOSCOPY;  Service: Cardiovascular;  Laterality: N/A;  . TEE WITHOUT CARDIOVERSION N/A 05/29/2017   Procedure: TRANSESOPHAGEAL ECHOCARDIOGRAM (TEE);  Surgeon: Lelon Perla, MD;  Location: [facility] ENDOSCOPY;  Service: Cardiovascular;  Laterality: N/A;    Current Outpatient Medications  Medication Sig Dispense Refill  . acetaminophen (TYLENOL) 500 MG tablet Take 1,000 mg by mouth 2 (two) times daily as needed for mild pain or moderate pain.     Marland Kitchen apixaban (ELIQUIS) 5 MG TABS tablet Take 1 tablet (5 mg total) by mouth 2 (two) times daily. 60 tablet 2  . flecainide (TAMBOCOR) 50 MG tablet Take 1 tablet (50 mg total) by mouth 2 (two) times daily. 60 tablet 3  .  metoprolol succinate (TOPROL-XL) 50 MG 24 hr tablet Take 1 tablet (50 mg total) by mouth daily. (Patient taking differently: Take 50 mg by mouth 2 (two) times daily. ) 90 tablet 1   No current facility-administered medications for this encounter.      No Known Allergies  Social History   Socioeconomic History  . Marital status: Married    Spouse name: Not on file  . Number of children: Not on file  . Years of education: Not on file  . Highest education level: Not on file  Occupational History  . Not on file  Social Needs  . Financial resource strain: Not on file  . Food insecurity:    Worry: Not on file    Inability: Not on file  . Transportation needs:    Medical: Not on file    Non-medical: Not on file  Tobacco Use  . Smoking status: Never Smoker  . Smokeless tobacco: Never Used  Substance and Sexual Activity  . Alcohol use: No  . Drug use: No  . Sexual activity: Not on file  Lifestyle  . Physical activity:    Days per week: Not on file    Minutes per session: Not on file  . Stress: Not on file  Relationships  . Social connections:    Talks on phone: Not on file    Gets together: Not on file    Attends religious service: Not on file    Active member of club or organization: Not on file    Attends meetings of clubs or organizations: Not on file    Relationship status: Not on file  . Intimate partner violence:    Fear of current or ex partner: Not on file    Emotionally abused: Not on file    Physically abused: Not on file    Forced sexual activity: Not on file  Other Topics Concern  . Not on file  Social History Narrative   Pt lives in [place] with spouse.  Works as a Chief Strategy Officer.    No family history on file.  ROS- All systems are reviewed and negative except as per the HPI above  Physical Exam: Vitals:   09/28/17 0838  BP: 114/80  Pulse: (!) 55  Weight: 98.4 kg  Height: 5\' 10"  (1.778 m)   Wt Readings from Last 3 Encounters:  09/28/17 98.4 kg  09/21/17 99.8 kg  08/26/17 99.8 kg    Labs: Lab Results  Component Value Date   NA 139 07/22/2017   K 3.9 07/22/2017   CL 106 07/22/2017   CO2 27 07/22/2017   GLUCOSE 90 07/22/2017   BUN 12 07/22/2017   CREATININE 0.90 07/22/2017    CALCIUM 9.1 07/22/2017   MG 1.8 09/06/2013   Lab Results  Component Value Date   INR 1.2 09/06/2013   No results found for: CHOL, HDL, LDLCALC, TRIG   GEN- The patient is well appearing, alert and oriented x 3 today.   Head- normocephalic, atraumatic Eyes-  Sclera clear, conjunctiva pink Ears- hearing intact Oropharynx- clear Neck- supple, no JVP Lymph- no cervical lymphadenopathy Lungs- Clear to ausculation bilaterally, normal work of breathing Heart- Regular rate and rhythm, no murmurs, rubs or gallops, PMI not laterally displaced GI- soft, NT, ND, + BS Extremities- no clubbing, cyanosis, or edema MS- no significant deformity or atrophy Skin- no rash or lesion Psych- euthymic mood, full affect Neuro- strength and sensation are intact  EKG-NSR at 62 bpm  LHC-6/20-Prox LAD lesion is 35%  stenosed.  The left ventricular systolic function is normal.  LV end diastolic pressure is normal.  The left ventricular ejection fraction is 50-55% by visual estimate.   1. Nonobstructive CAD 2. Low normal LV function. EF 50-55% 3. Normal LV filling pressures.  4. Normal right heart pressures. 5. Normal cardiac output.   Assessment and Plan: 1. Persistent afib Converted to SR on flecainide Continue  50 mg bid Continue metoprolol 50 mg daily Pending home sleep study  2. CHA2DS2VASc score of 0 Pt prefers to stay on eliquis 5 mg bid for now   F/u with Dr. Rayann Heman 10/10  Geroge Baseman. Deadra Diggins, [facility] [address] [place], [place] [address] [phone]

## 2017-10-20 DIAGNOSIS — R0683 Snoring: Secondary | ICD-10-CM | POA: Diagnosis not present

## 2017-10-20 DIAGNOSIS — I480 Paroxysmal atrial fibrillation: Secondary | ICD-10-CM | POA: Diagnosis not present

## 2017-10-21 DIAGNOSIS — G4733 Obstructive sleep apnea (adult) (pediatric): Secondary | ICD-10-CM | POA: Diagnosis not present

## 2017-11-26 ENCOUNTER — Other Ambulatory Visit: Payer: Self-pay

## 2017-11-26 ENCOUNTER — Ambulatory Visit (HOSPITAL_COMMUNITY): Payer: BLUE CROSS/BLUE SHIELD | Attending: Internal Medicine

## 2017-11-26 ENCOUNTER — Ambulatory Visit: Payer: BLUE CROSS/BLUE SHIELD | Admitting: Internal Medicine

## 2017-11-26 ENCOUNTER — Encounter: Payer: Self-pay | Admitting: Internal Medicine

## 2017-11-26 VITALS — BP 130/78 | HR 54 | Ht 70.0 in | Wt 219.2 lb

## 2017-11-26 DIAGNOSIS — I4819 Other persistent atrial fibrillation: Secondary | ICD-10-CM | POA: Diagnosis not present

## 2017-11-26 DIAGNOSIS — I428 Other cardiomyopathies: Secondary | ICD-10-CM | POA: Diagnosis not present

## 2017-11-26 MED ORDER — METOPROLOL SUCCINATE ER 50 MG PO TB24: 50.0000 mg | ORAL_TABLET | Freq: Every day | ORAL | 3 refills | Status: DC

## 2017-11-26 NOTE — Progress Notes (Signed)
PCP: Idelle Crouch, MD Primary Cardiologist: Dr Nehemiah Massed Primary EP: Dr Rayann Heman  Francisco Reed is a 57 y.o. male who presents today for routine electrophysiology followup.  Since last being seen in our clinic, the patient reports doing very well.  Today, he denies symptoms of palpitations, chest pain, shortness of breath,  lower extremity edema, dizziness, presyncope, or syncope.  The patient is otherwise without complaint today.   Past Medical History:  Diagnosis Date  . Chronic systolic dysfunction of left ventricle   . Hyperlipemia    pt denies  . Obstructive sleep apnea    mild  . Persistent atrial fibrillation    Past Surgical History:  Procedure Laterality Date  . ATRIAL FIBRILLATION ABLATION N/A 05/29/2017   Procedure: ATRIAL FIBRILLATION ABLATION;  Surgeon: [name], MD;  Location: [place] CV LAB;  Service: Cardiovascular;  Laterality: N/A;  . CARDIOVERSION    . CARDIOVERSION N/A 12/24/2016   Procedure: CARDIOVERSION;  Surgeon: Josue Hector, MD;  Location: [facility] ENDOSCOPY;  Service: Cardiovascular;  Laterality: N/A;  . CARDIOVERSION N/A 03/20/2017   Procedure: CARDIOVERSION;  Surgeon: Fay Records, MD;  Location: [facility] ENDOSCOPY;  Service: Cardiovascular;  Laterality: N/A;  . ELECTROPHYSIOLOGIC STUDY N/A 12/28/2014   Procedure: CARDIOVERSION;  Surgeon: Corey Skains, MD;  Location: [facility] ORS;  Service: Cardiovascular;  Laterality: N/A;  . ELECTROPHYSIOLOGIC STUDY N/A 09/07/2015   Procedure: CARDIOVERSION;  Surgeon: Corey Skains, MD;  Location: [facility] ORS;  Service: Cardiovascular;  Laterality: N/A;  . ELECTROPHYSIOLOGIC STUDY N/A 03/18/2016   Procedure: Atrial Fibrillation Ablation;  Surgeon: [name], MD;  Location: [place] CV LAB;  Service: Cardiovascular;  Laterality: N/A;  . RIGHT/LEFT HEART CATH AND CORONARY ANGIOGRAPHY N/A 08/06/2017   Procedure: RIGHT/LEFT HEART CATH AND CORONARY ANGIOGRAPHY;  Surgeon: Martinique, Peter M, MD;  Location: [place] CV  LAB;  Service: Cardiovascular;  Laterality: N/A;  . TEE WITHOUT CARDIOVERSION N/A 09/07/2015   Procedure: TRANSESOPHAGEAL ECHOCARDIOGRAM (TEE);  Surgeon: Corey Skains, MD;  Location: [facility] ORS;  Service: Cardiovascular;  Laterality: N/A;  . TEE WITHOUT CARDIOVERSION N/A 03/17/2016   Procedure: TRANSESOPHAGEAL ECHOCARDIOGRAM (TEE);  Surgeon: Lelon Perla, MD;  Location: [facility] ENDOSCOPY;  Service: Cardiovascular;  Laterality: N/A;  . TEE WITHOUT CARDIOVERSION N/A 12/24/2016   Procedure: TRANSESOPHAGEAL ECHOCARDIOGRAM (TEE);  Surgeon: Josue Hector, MD;  Location: [facility] ENDOSCOPY;  Service: Cardiovascular;  Laterality: N/A;  . TEE WITHOUT CARDIOVERSION N/A 05/29/2017   Procedure: TRANSESOPHAGEAL ECHOCARDIOGRAM (TEE);  Surgeon: Lelon Perla, MD;  Location: [facility] ENDOSCOPY;  Service: Cardiovascular;  Laterality: N/A;    ROS- all systems are reviewed and negatives except as per HPI above  Current Outpatient Medications  Medication Sig Dispense Refill  . acetaminophen (TYLENOL) 500 MG tablet Take 1,000 mg by mouth 2 (two) times daily as needed for mild pain or moderate pain.     Marland Kitchen apixaban (ELIQUIS) 5 MG TABS tablet Take 1 tablet (5 mg total) by mouth 2 (two) times daily. 60 tablet 2  . flecainide (TAMBOCOR) 50 MG tablet Take 1 tablet (50 mg total) by mouth 2 (two) times daily. 60 tablet 3  . metoprolol succinate (TOPROL-XL) 50 MG 24 hr tablet Take 1 tablet (50 mg total) by mouth daily. (Patient taking differently: Take 50 mg by mouth 2 (two) times daily. ) 90 tablet 1   No current facility-administered medications for this visit.     Physical Exam: Vitals:   11/26/17 1128  BP: 130/78  Pulse: (!) 54  SpO2: 94%  Weight: 219 lb 3.2 oz (99.4 kg)  Height: 5\' 10"  (1.778 m)    GEN- The patient is well appearing, alert and oriented x 3 today.   Head- normocephalic, atraumatic Eyes-  Sclera clear, conjunctiva pink Ears- hearing intact Oropharynx- clear Lungs- Clear to ausculation bilaterally,  normal work of breathing Heart- Regular rate and rhythm, no murmurs, rubs or gallops, PMI not laterally displaced GI- soft, NT, ND, + BS Extremities- no clubbing, cyanosis, or edema  Wt Readings from Last 3 Encounters:  11/26/17 219 lb 3.2 oz (99.4 kg)  09/28/17 217 lb (98.4 kg)  09/21/17 220 lb (99.8 kg)    EKG tracing ordered today is personally reviewed and shows sinus bradycardia 54 bpm, otherwise normal ekg  Assessment and Plan:  1. Persistent afib Maintaining sinus rhythm post ablation with low dose flecainide chads2vasc score is 1. He prefers to stay on eliquis for now  2. Nonischemic CM Echo today is pending  Return in 4 months  [name] MD, [facility] 11/26/2017 11:45 AM

## 2017-11-26 NOTE — Patient Instructions (Addendum)
Medication Instructions:  Your physician recommends that you continue on your current medications as directed. Please refer to the Current Medication list given to you today.  Labwork: None ordered.  Testing/Procedures: None ordered.  Follow-Up: Your physician wants you to follow-up in: 4 months with Dr. Allred.      Any Other Special Instructions Will Be Listed Below (If Applicable).  If you need a refill on your cardiac medications before your next appointment, please call your pharmacy.   

## 2017-12-11 ENCOUNTER — Other Ambulatory Visit: Payer: Self-pay | Admitting: Nurse Practitioner

## 2018-03-29 ENCOUNTER — Encounter: Payer: Self-pay | Admitting: Internal Medicine

## 2018-03-29 ENCOUNTER — Ambulatory Visit: Payer: BLUE CROSS/BLUE SHIELD | Admitting: Internal Medicine

## 2018-03-29 VITALS — BP 116/80 | HR 55 | Ht 70.0 in | Wt 225.8 lb

## 2018-03-29 DIAGNOSIS — N522 Drug-induced erectile dysfunction: Secondary | ICD-10-CM | POA: Diagnosis not present

## 2018-03-29 DIAGNOSIS — I4819 Other persistent atrial fibrillation: Secondary | ICD-10-CM

## 2018-03-29 DIAGNOSIS — I428 Other cardiomyopathies: Secondary | ICD-10-CM

## 2018-03-29 NOTE — Patient Instructions (Addendum)
Medication Instructions:  Your physician recommends that you continue on your current medications as directed. Please refer to the Current Medication list given to you today.  Labwork: None ordered.  Testing/Procedures: None ordered.  Follow-Up: Your physician wants you to follow-up in: 6 months with AFIB clinic.   You will receive a reminder letter in the mail two months in advance. If you don't receive a letter, please call our office to schedule the follow-up appointment.   Any Other Special Instructions Will Be Listed Below (If Applicable).  If you need a refill on your cardiac medications before your next appointment, please call your pharmacy.

## 2018-03-29 NOTE — Progress Notes (Signed)
PCP: Idelle Crouch, MD Primary Cardiologist: Dr Nehemiah Massed Primary EP: Dr Rayann Heman  Francisco Reed is a 58 y.o. male who presents today for routine electrophysiology followup.  Since last being seen in our clinic, the patient reports doing very well.  Today, he denies symptoms of palpitations, chest pain, shortness of breath,  lower extremity edema, dizziness, presyncope, or syncope.  No afib since his last visit.  He has noticed some erectile dysfunction.  The patient is otherwise without complaint today.   Past Medical History:  Diagnosis Date  . Chronic systolic dysfunction of left ventricle   . Hyperlipemia    pt denies  . Obstructive sleep apnea    mild  . Persistent atrial fibrillation    Past Surgical History:  Procedure Laterality Date  . ATRIAL FIBRILLATION ABLATION N/A 05/29/2017   Procedure: ATRIAL FIBRILLATION ABLATION;  Surgeon: [name], MD;  Location: [place] [facility];  Service: Cardiovascular;  Laterality: N/A;  . CARDIOVERSION    . CARDIOVERSION N/A 12/24/2016   Procedure: CARDIOVERSION;  Surgeon: Josue Hector, MD;  Location: [facility] ENDOSCOPY;  Service: Cardiovascular;  Laterality: N/A;  . CARDIOVERSION N/A 03/20/2017   Procedure: CARDIOVERSION;  Surgeon: Fay Records, MD;  Location: [facility] ENDOSCOPY;  Service: Cardiovascular;  Laterality: N/A;  . ELECTROPHYSIOLOGIC STUDY N/A 12/28/2014   Procedure: CARDIOVERSION;  Surgeon: Corey Skains, MD;  Location: [facility] ORS;  Service: Cardiovascular;  Laterality: N/A;  . ELECTROPHYSIOLOGIC STUDY N/A 09/07/2015   Procedure: CARDIOVERSION;  Surgeon: Corey Skains, MD;  Location: [facility] ORS;  Service: Cardiovascular;  Laterality: N/A;  . ELECTROPHYSIOLOGIC STUDY N/A 03/18/2016   Procedure: Atrial Fibrillation Ablation;  Surgeon: [name], MD;  Location: [place] [facility];  Service: Cardiovascular;  Laterality: N/A;  . RIGHT/LEFT HEART CATH AND CORONARY ANGIOGRAPHY N/A 08/06/2017   Procedure: RIGHT/LEFT HEART CATH AND  CORONARY ANGIOGRAPHY;  Surgeon: Martinique, Peter M, MD;  Location: [place] [facility];  Service: Cardiovascular;  Laterality: N/A;  . TEE WITHOUT CARDIOVERSION N/A 09/07/2015   Procedure: TRANSESOPHAGEAL ECHOCARDIOGRAM (TEE);  Surgeon: Corey Skains, MD;  Location: [facility] ORS;  Service: Cardiovascular;  Laterality: N/A;  . TEE WITHOUT CARDIOVERSION N/A 03/17/2016   Procedure: TRANSESOPHAGEAL ECHOCARDIOGRAM (TEE);  Surgeon: Lelon Perla, MD;  Location: [facility] ENDOSCOPY;  Service: Cardiovascular;  Laterality: N/A;  . TEE WITHOUT CARDIOVERSION N/A 12/24/2016   Procedure: TRANSESOPHAGEAL ECHOCARDIOGRAM (TEE);  Surgeon: Josue Hector, MD;  Location: [facility] ENDOSCOPY;  Service: Cardiovascular;  Laterality: N/A;  . TEE WITHOUT CARDIOVERSION N/A 05/29/2017   Procedure: TRANSESOPHAGEAL ECHOCARDIOGRAM (TEE);  Surgeon: Lelon Perla, MD;  Location: [facility] ENDOSCOPY;  Service: Cardiovascular;  Laterality: N/A;    ROS- all systems are reviewed and negatives except as per HPI above  Current Outpatient Medications  Medication Sig Dispense Refill  . acetaminophen (TYLENOL) 500 MG tablet Take 1,000 mg by mouth 2 (two) times daily as needed for mild pain or moderate pain.     Marland Kitchen apixaban (ELIQUIS) 5 MG TABS tablet Take 1 tablet (5 mg total) by mouth 2 (two) times daily. 60 tablet 2  . flecainide (TAMBOCOR) 50 MG tablet TAKE 1 TABLET BY MOUTH TWICE A DAY 180 tablet 3  . metoprolol succinate (TOPROL-XL) 50 MG 24 hr tablet Take 1 tablet (50 mg total) by mouth daily. 90 tablet 3   No current facility-administered medications for this visit.     Physical Exam: Vitals:   03/29/18 0818  BP: 116/80  Pulse: (!) 55  Weight: 225 lb  12.8 oz (102.4 kg)  Height: 5\' 10"  (1.778 m)    GEN- The patient is well appearing, alert and oriented x 3 today.   Head- normocephalic, atraumatic Eyes-  Sclera clear, conjunctiva pink Ears- hearing intact Oropharynx- clear Lungs- Clear to ausculation bilaterally, normal work of  breathing Heart- Regular rate and rhythm, no murmurs, rubs or gallops, PMI not laterally displaced GI- soft, NT, ND, + BS Extremities- no clubbing, cyanosis, or edema  Wt Readings from Last 3 Encounters:  03/29/18 225 lb 12.8 oz (102.4 kg)  11/26/17 219 lb 3.2 oz (99.4 kg)  09/28/17 217 lb (98.4 kg)    EKG tracing ordered today is personally reviewed and shows sinus rhythm 55 bpm  Assessment and Plan:  1. persistent afib Doing well No afib since his last visit chads2vasc score is 1.  He prefers to stay on eliquis  2. Tachycardia mediated CM Echo 11/26/17 is reviewed and reveals that his EF remains preserved with sinus rhythm  3. Aortic enlargement (21mm by echo) Consider dedicated study to further evaluate on return  4. ED New diagnosis We discussed reducing toprol I also discussed viagra/ cialis as options He wishes to make no changes today  Follow-up in AF clinic in 6 months  [name] MD, [facility] 03/29/2018 8:35 AM

## 2018-05-04 ENCOUNTER — Other Ambulatory Visit: Payer: Self-pay | Admitting: Internal Medicine

## 2018-05-05 NOTE — Telephone Encounter (Signed)
Weight 102kg, age 58, SCr 0.9 on 07/22/17. Last OV 03/29/18.

## 2018-08-05 ENCOUNTER — Telehealth: Payer: Self-pay | Admitting: Internal Medicine

## 2018-08-05 MED ORDER — TADALAFIL 5 MG PO TABS: 5.0000 mg | ORAL_TABLET | Freq: Every day | ORAL | 3 refills | Status: AC | PRN

## 2018-08-05 NOTE — Telephone Encounter (Signed)
Spoke with Pt.  Pt now interested in trying cialis.  Will send to [facility] in [place] as requested.

## 2018-08-05 NOTE — Telephone Encounter (Signed)
New message     Patient would like to speak to Dr. Rayann Heman regarding conversation they had during patients last visit is all he would tell me.

## 2018-09-08 ENCOUNTER — Encounter (HOSPITAL_COMMUNITY): Payer: Self-pay | Admitting: *Deleted

## 2018-09-27 ENCOUNTER — Inpatient Hospital Stay (HOSPITAL_COMMUNITY)
Admission: RE | Admit: 2018-09-27 | Payer: BLUE CROSS/BLUE SHIELD | Source: Ambulatory Visit | Admitting: Physician Assistant

## 2018-10-21 DIAGNOSIS — Z20828 Contact with and (suspected) exposure to other viral communicable diseases: Secondary | ICD-10-CM | POA: Diagnosis not present

## 2018-11-02 ENCOUNTER — Other Ambulatory Visit: Payer: Self-pay | Admitting: Internal Medicine

## 2018-11-02 DIAGNOSIS — I4819 Other persistent atrial fibrillation: Secondary | ICD-10-CM

## 2018-11-02 NOTE — Telephone Encounter (Signed)
Prescription refill request for Eliquis received.  Last office visit: [place] (03-29-2018) Scr: 0.9 (07-22-2017) Age: 58 y.o. Weight: 102.4 kg  Pt is overdue for blood work. Called pt and made him aware that to make sure he is on the right dose, he needed to have some updated blood work. Blood work ordered and pt stated he would go and get updated labs today. Told pt to give [place] a call if he has any questions and that we could order a 1 month refill of Eliquis.

## 2018-11-12 ENCOUNTER — Other Ambulatory Visit: Payer: Self-pay

## 2018-11-12 ENCOUNTER — Telehealth: Payer: Self-pay | Admitting: *Deleted

## 2018-11-12 ENCOUNTER — Telehealth: Payer: Self-pay

## 2018-11-12 DIAGNOSIS — Z1211 Encounter for screening for malignant neoplasm of colon: Secondary | ICD-10-CM

## 2018-11-12 NOTE — Telephone Encounter (Signed)
    [facility] Pre-operative Risk Assessment    Request for surgical clearance:  1. What type of surgery is being performed? COLONOSCOPY   2. When is this surgery scheduled? 12/10/18   3. What type of clearance is required (medical clearance vs. Pharmacy clearance to hold med vs. Both)? BOTH  4. Are there any medications that need to be held prior to surgery and how long? ELIQUIS   5. Practice name and name of physician performing surgery? [facility] [place]; DR. Bailey Mech ANNA   6. What is your office phone number [phone]    7.   What is your office fax number [phone]  8.   Anesthesia type (None, local, MAC, general) ? NOT LISTED, PROPOFOL?    Julaine Hua 11/12/2018, 10:20 AM  _________________________________________________________________   (provider comments below)

## 2018-11-12 NOTE — Telephone Encounter (Signed)
Gastroenterology Pre-Procedure Review  Request Date: 12/10/18 Requesting Physician: Dr. Vicente Males  PATIENT REVIEW QUESTIONS: The patient responded to the following health history questions as indicated:    1. Are you having any GI issues? yes (Loose stools regularly declined appt.  States his son has Crohns.) 2. Do you have a personal history of Polyps? no 3. Do you have a family history of Colon Cancer or Polyps? no 4. Diabetes Mellitus? no 5. Joint replacements in the past 12 months?no 6. Major health problems in the past 3 months?no 7. Any artificial heart valves, MVP, or defibrillator?no    MEDICATIONS & ALLERGIES:    Patient reports the following regarding taking any anticoagulation/antiplatelet therapy:   Plavix, Coumadin, Eliquis, Xarelto, Lovenox, Pradaxa, Brilinta, or Effient? yes (Elliquis prescribed by Dr. Rayann Heman. Blood Thinner sent to Dr. Rayann Heman) Aspirin? no  Patient confirms/reports the following medications:  Current Outpatient Medications  Medication Sig Dispense Refill  . acetaminophen (TYLENOL) 500 MG tablet Take 1,000 mg by mouth 2 (two) times daily as needed for mild pain or moderate pain.     Marland Kitchen ELIQUIS 5 MG TABS tablet TAKE 1 TABLET (5 MG TOTAL) 2 (TWO) TIMES DAILY BY MOUTH. 60 tablet 0  . flecainide (TAMBOCOR) 50 MG tablet TAKE 1 TABLET BY MOUTH TWICE A DAY 180 tablet 3  . metoprolol succinate (TOPROL-XL) 50 MG 24 hr tablet Take 1 tablet (50 mg total) by mouth daily. 90 tablet 3  . tadalafil (CIALIS) 5 MG tablet Take 1 tablet (5 mg total) by mouth daily as needed for erectile dysfunction. 10 tablet 3   No current facility-administered medications for this visit.     Patient confirms/reports the following allergies:  No Known Allergies  No orders of the defined types were placed in this encounter.   AUTHORIZATION INFORMATION Primary Insurance: 1D#: Group #:  Secondary Insurance: 1D#: Group #:  SCHEDULE INFORMATION: Date: 12/10/18 Time: Location:[facility]

## 2018-11-12 NOTE — Telephone Encounter (Signed)
Patient with diagnosis of afib on Eliquis for anticoagulation.    Procedure:  COLONOSCOPY  Date of procedure: 12/10/2018  CHADS2-VASc score of  1 (CHF)  CrCl 76 ml/min  Per office protocol, patient can hold Eliquis for 1-2 days prior to procedure.

## 2018-11-15 ENCOUNTER — Other Ambulatory Visit
Admission: RE | Admit: 2018-11-15 | Discharge: 2018-11-15 | Disposition: A | Discharge status: Home / Self Care | Payer: BC Managed Care – PPO | Source: Ambulatory Visit | Attending: Internal Medicine | Admitting: Internal Medicine

## 2018-11-15 DIAGNOSIS — I4819 Other persistent atrial fibrillation: Secondary | ICD-10-CM | POA: Diagnosis not present

## 2018-11-15 LAB — BASIC METABOLIC PANEL
Anion gap: 8 (ref 5–15)
BUN: 16 mg/dL (ref 6–20)
CO2: 25 mmol/L (ref 22–32)
Calcium: 8.9 mg/dL (ref 8.9–10.3)
Chloride: 106 mmol/L (ref 98–111)
Creatinine, Ser: 1.29 mg/dL — ABNORMAL HIGH (ref 0.61–1.24)
GFR calc Af Amer: >60 mL/min (ref >60)
GFR calc non Af Amer: >60 mL/min (ref >60)
Glucose, Bld: 96 mg/dL (ref 70–99)
Potassium: 3.8 mmol/L (ref 3.5–5.1)
Sodium: 139 mmol/L (ref 135–145)

## 2018-11-15 LAB — CBC
HCT: 41 % (ref 39.0–52.0)
Hemoglobin: 13.5 g/dL (ref 13.0–17.0)
MCH: 30.2 pg (ref 26.0–34.0)
MCHC: 32.9 g/dL (ref 30.0–36.0)
MCV: 91.7 fL (ref 80.0–100.0)
Platelets: 182 10*3/uL (ref 150–400)
RBC: 4.47 MIL/uL (ref 4.22–5.81)
RDW: 12.6 % (ref 11.5–15.5)
WBC: 5.8 10*3/uL (ref 4.0–10.5)
nRBC: 0 % (ref 0.0–0.2)

## 2018-11-17 NOTE — Telephone Encounter (Signed)
   Primary Cardiologist: No primary care provider on file.  Chart reviewed as part of pre-operative protocol coverage. Patient was contacted 11/17/2018 in reference to pre-operative risk assessment for pending surgery as outlined below.  YON PORTILLO was last seen on 03/29/18 by Dr. Rayann Heman.  Since that day, HABIB KIMMERLY has done well. He does not have a history of CAD, MI, or stroke. He denies anginal symptoms.  Per our clinical pharmacist: Patient with diagnosis of afib on Eliquis for anticoagulation.    Procedure: COLONOSCOPY Date of procedure: 12/10/2018  CHADS2-VASc score of  1 (CHF)  CrCl 76 ml/min  Per office protocol, patient can hold Eliquis for 1-2 days prior to procedure.     Therefore, based on ACC/AHA guidelines, the patient would be at acceptable risk for the planned procedure without further cardiovascular testing.   I will route this recommendation to the requesting party via Epic fax function and remove from pre-op pool.  Please call with questions.  Tami Lin Hansen Carino, PA 11/17/2018, 10:54 AM

## 2018-11-17 NOTE — Telephone Encounter (Signed)
VM is full. I [place] beach. She will get a hold of him and ask him to call the office. Please let me know when he calls to transfer the call.

## 2018-11-30 DIAGNOSIS — R972 Elevated prostate specific antigen [PSA]: Secondary | ICD-10-CM | POA: Diagnosis not present

## 2018-11-30 DIAGNOSIS — I10 Essential (primary) hypertension: Secondary | ICD-10-CM | POA: Diagnosis not present

## 2018-11-30 DIAGNOSIS — R7309 Other abnormal glucose: Secondary | ICD-10-CM | POA: Diagnosis not present

## 2018-11-30 DIAGNOSIS — I429 Cardiomyopathy, unspecified: Secondary | ICD-10-CM | POA: Diagnosis not present

## 2018-11-30 DIAGNOSIS — E782 Mixed hyperlipidemia: Secondary | ICD-10-CM | POA: Diagnosis not present

## 2018-11-30 DIAGNOSIS — I480 Paroxysmal atrial fibrillation: Secondary | ICD-10-CM | POA: Diagnosis not present

## 2018-11-30 DIAGNOSIS — Z79899 Other long term (current) drug therapy: Secondary | ICD-10-CM | POA: Diagnosis not present

## 2018-12-06 ENCOUNTER — Telehealth: Payer: Self-pay | Admitting: Gastroenterology

## 2018-12-06 ENCOUNTER — Other Ambulatory Visit: Payer: Self-pay

## 2018-12-06 NOTE — Telephone Encounter (Signed)
Pt was contacted this morning and had questions regarding his medications and what he can and cannot take the morning of his procedure. Pt also had questions regarding his COVID testing date. Pt was inquiring if he could go Wednesday morning instead of Tuesday as he was out of town just on Tuesday of this week. Spoke with Endo dept and was given the ok to go Wednesday morning at 8:00am.

## 2018-12-06 NOTE — Telephone Encounter (Signed)
Patient called about having his covid test on another day & colonoscopy on Friday. He also had questions about medication.

## 2018-12-07 ENCOUNTER — Other Ambulatory Visit: Admission: RE | Admit: 2018-12-07 | Payer: BC Managed Care – PPO | Source: Ambulatory Visit

## 2018-12-08 ENCOUNTER — Other Ambulatory Visit: Payer: Self-pay

## 2018-12-08 ENCOUNTER — Other Ambulatory Visit
Admission: RE | Admit: 2018-12-08 | Discharge: 2018-12-08 | Disposition: A | Discharge status: Home / Self Care | Payer: BC Managed Care – PPO | Source: Ambulatory Visit | Attending: Gastroenterology | Admitting: Gastroenterology

## 2018-12-08 DIAGNOSIS — Z20828 Contact with and (suspected) exposure to other viral communicable diseases: Secondary | ICD-10-CM | POA: Diagnosis not present

## 2018-12-08 DIAGNOSIS — Z01812 Encounter for preprocedural laboratory examination: Secondary | ICD-10-CM | POA: Diagnosis not present

## 2018-12-08 LAB — SARS CORONAVIRUS 2 (TAT 6-24 HRS): SARS Coronavirus 2: NEGATIVE

## 2018-12-09 ENCOUNTER — Telehealth: Payer: Self-pay

## 2018-12-09 NOTE — Telephone Encounter (Signed)
Contacted patient in regards to his blood thinner request prior to tomorrows colonoscopy.  He did stop Eliquis yesterday for is colonoscopy scheduled for 12/10/18.  Advice per Tami Lin: "Patient can hold Eliquis for 1-2 days prior to procedure.  Blood Thinner Advice scanned to chart.  Thanks [employer]

## 2018-12-10 ENCOUNTER — Encounter: Payer: Self-pay | Admitting: *Deleted

## 2018-12-10 ENCOUNTER — Other Ambulatory Visit: Payer: Self-pay

## 2018-12-10 ENCOUNTER — Ambulatory Visit: Payer: BC Managed Care – PPO | Admitting: Anesthesiology

## 2018-12-10 ENCOUNTER — Encounter
Admission: RE | Disposition: A | Discharge status: Home / Self Care | Payer: Self-pay | Source: Home / Self Care | Attending: Gastroenterology

## 2018-12-10 ENCOUNTER — Ambulatory Visit
Admission: RE | Admit: 2018-12-10 | Discharge: 2018-12-10 | Disposition: A | Discharge status: Home / Self Care | Payer: BC Managed Care – PPO | Attending: Gastroenterology | Admitting: Gastroenterology

## 2018-12-10 DIAGNOSIS — D120 Benign neoplasm of cecum: Secondary | ICD-10-CM | POA: Diagnosis not present

## 2018-12-10 DIAGNOSIS — K640 First degree hemorrhoids: Secondary | ICD-10-CM | POA: Diagnosis not present

## 2018-12-10 DIAGNOSIS — Z79899 Other long term (current) drug therapy: Secondary | ICD-10-CM | POA: Diagnosis not present

## 2018-12-10 DIAGNOSIS — E782 Mixed hyperlipidemia: Secondary | ICD-10-CM | POA: Diagnosis not present

## 2018-12-10 DIAGNOSIS — G4733 Obstructive sleep apnea (adult) (pediatric): Secondary | ICD-10-CM | POA: Insufficient documentation

## 2018-12-10 DIAGNOSIS — I4819 Other persistent atrial fibrillation: Secondary | ICD-10-CM | POA: Insufficient documentation

## 2018-12-10 DIAGNOSIS — K5730 Diverticulosis of large intestine without perforation or abscess without bleeding: Secondary | ICD-10-CM | POA: Insufficient documentation

## 2018-12-10 DIAGNOSIS — Z7901 Long term (current) use of anticoagulants: Secondary | ICD-10-CM | POA: Insufficient documentation

## 2018-12-10 DIAGNOSIS — K635 Polyp of colon: Secondary | ICD-10-CM | POA: Diagnosis not present

## 2018-12-10 DIAGNOSIS — N529 Male erectile dysfunction, unspecified: Secondary | ICD-10-CM | POA: Diagnosis not present

## 2018-12-10 DIAGNOSIS — Z1211 Encounter for screening for malignant neoplasm of colon: Secondary | ICD-10-CM

## 2018-12-10 HISTORY — PX: COLONOSCOPY WITH PROPOFOL: SHX5780

## 2018-12-10 SURGERY — COLONOSCOPY WITH PROPOFOL
Anesthesia: General

## 2018-12-10 MED ORDER — PROPOFOL 500 MG/50ML IV EMUL
INTRAVENOUS | Status: DC | PRN
  Administered 2018-12-10: 140 ug/kg/min via INTRAVENOUS

## 2018-12-10 MED ORDER — SODIUM CHLORIDE 0.9 % IV SOLN
INTRAVENOUS | Status: DC
  Administered 2018-12-10: 11:00:00 via INTRAVENOUS

## 2018-12-10 MED ORDER — PROPOFOL 10 MG/ML IV BOLUS
INTRAVENOUS | Status: DC | PRN
  Administered 2018-12-10: 100 mg via INTRAVENOUS

## 2018-12-10 NOTE — Op Note (Signed)
[facility] Gastroenterology Patient Name: Francisco Reed Procedure Date: 12/10/2018 10:42 AM MRN: [id number] Account #: [id number] Date of Birth: May 06, 1960 Admit Type: Outpatient Age: 58 Room: [facility] ENDO ROOM 4 Gender: Male Note Status: Finalized Procedure:            Colonoscopy Indications:          Screening for colorectal malignant neoplasm Providers:            Jonathon Bellows MD, MD Referring MD:         Leonie Douglas. Doy Hutching, MD (Referring MD) Medicines:            Monitored Anesthesia Care Complications:        No immediate complications. Procedure:            Pre-Anesthesia Assessment:                       - Prior to the procedure, a History and Physical was                        performed, and patient medications, allergies and                        sensitivities were reviewed. The patient's tolerance of                        previous anesthesia was reviewed.                       - The risks and benefits of the procedure and the                        sedation options and risks were discussed with the                        patient. All questions were answered and informed                        consent was obtained.                       - ASA Grade Assessment: II - A patient with mild                        systemic disease.                       After obtaining informed consent, the colonoscope was                        passed under direct vision. Throughout the procedure,                        the patient's blood pressure, pulse, and oxygen                        saturations were monitored continuously. The                        Colonoscope was introduced through the anus and  advanced to the the cecum, identified by the                        appendiceal orifice. The colonoscopy was performed                        without difficulty. The patient tolerated the procedure                        well. The quality of the bowel  preparation was                        excellent. Findings:      The perianal and digital rectal examinations were normal.      Two sessile polyps were found in the cecum. The polyps were 4 to 6 mm in       size. These polyps were removed with a cold snare. Resection and       retrieval were complete.      Multiple small-mouthed diverticula were found in the sigmoid colon.      Non-bleeding internal hemorrhoids were found during retroflexion. The       hemorrhoids were medium-sized and Grade I (internal hemorrhoids that do       not prolapse).      The exam was otherwise without abnormality on direct and retroflexion       views. Impression:           - Two 4 to 6 mm polyps in the cecum, removed with a                        cold snare. Resected and retrieved.                       - Diverticulosis in the sigmoid colon.                       - Non-bleeding internal hemorrhoids.                       - The examination was otherwise normal on direct and                        retroflexion views. Recommendation:       - Discharge patient to home (with escort).                       - Resume previous diet.                       - Continue present medications.                       - Await pathology results.                       - Repeat colonoscopy for surveillance based on                        pathology results. Procedure Code(s):    --- Professional ---                       [phone], Colonoscopy, flexible; with removal  of tumor(s),                        polyp(s), or other lesion(s) by snare technique Diagnosis Code(s):    --- Professional ---                       Z12.11, Encounter for screening for malignant neoplasm                        of colon                       K63.5, Polyp of colon                       K64.0, First degree hemorrhoids                       K57.30, Diverticulosis of large intestine without                        perforation or abscess without bleeding CPT  copyright 2019 American Medical Association. All rights reserved. The codes documented in this report are preliminary and upon coder review may  be revised to meet current compliance requirements. Jonathon Bellows, MD Jonathon Bellows MD, MD 12/10/2018 11:01:57 AM This report has been signed electronically. Number of Addenda: 0 Note Initiated On: 12/10/2018 10:42 AM Scope Withdrawal Time: 0 hours 10 minutes 14 seconds  Total Procedure Duration: 0 hours 11 minutes 48 seconds  Estimated Blood Loss: Estimated blood loss: none.      [place]

## 2018-12-10 NOTE — Transfer of Care (Signed)
Immediate Anesthesia Transfer of Care Note  Patient: AZRIEL MATEEN  Procedure(s) Performed: COLONOSCOPY WITH PROPOFOL (N/A )  Patient Location: PACU  Anesthesia Type:General  Level of Consciousness: awake, alert  and oriented  Airway & Oxygen Therapy: Patient Spontanous Breathing  Post-op Assessment: Report given to RN and Post -op Vital signs reviewed and stable  Post vital signs: Reviewed and stable  Last Vitals:  Vitals Value Taken Time  BP 116/86 12/10/18 1103  Temp 36.4 C 12/10/18 1103  Pulse 86 12/10/18 1103  Resp 17 12/10/18 1103  SpO2 96 % 12/10/18 1103  Vitals shown include unvalidated device data.  Last Pain:  Vitals:   12/10/18 1103  TempSrc: Tympanic  PainSc: Asleep         Complications: No apparent anesthesia complications

## 2018-12-10 NOTE — Anesthesia Post-op Follow-up Note (Signed)
Anesthesia QCDR form completed.        

## 2018-12-10 NOTE — Anesthesia Postprocedure Evaluation (Signed)
Anesthesia Post Note  Patient: Francisco Reed  Procedure(s) Performed: COLONOSCOPY WITH PROPOFOL (N/A )  Patient location during evaluation: Endoscopy Anesthesia Type: General Level of consciousness: awake and alert Pain management: pain level controlled Vital Signs Assessment: post-procedure vital signs reviewed and stable Respiratory status: spontaneous breathing, nonlabored ventilation, respiratory function stable and patient connected to nasal cannula oxygen Cardiovascular status: blood pressure returned to baseline and stable Postop Assessment: no apparent nausea or vomiting Anesthetic complications: no     Last Vitals:  Vitals:   12/10/18 1113 12/10/18 1123  BP: 108/79 (!) 121/95  Pulse: 84 74  Resp: 18 14  Temp:    SpO2: 97% 99%    Last Pain:  Vitals:   12/10/18 1103  TempSrc: Tympanic  PainSc: Asleep                 Precious Haws Calissa Swenor

## 2018-12-10 NOTE — H&P (Signed)
Francisco Bellows, MD [address] [place], [place], [place], [address] [address], [place], [place], [place], [address] Phone: [phone]  Fax: [phone]  Primary Care Physician:  Idelle Crouch, MD   Pre-Procedure History & Physical: HPI:  Francisco Reed is a 58 y.o. male is here for an colonoscopy.   Past Medical History:  Diagnosis Date  . Chronic systolic dysfunction of left ventricle   . Obstructive sleep apnea    mild  . Persistent atrial fibrillation [facility])     Past Surgical History:  Procedure Laterality Date  . ATRIAL FIBRILLATION ABLATION N/A 05/29/2017   Procedure: ATRIAL FIBRILLATION ABLATION;  Surgeon: [name], MD;  Location: [place] [facility];  Service: Cardiovascular;  Laterality: N/A;  . CARDIAC CATHETERIZATION    . CARDIOVERSION    . CARDIOVERSION N/A 12/24/2016   Procedure: CARDIOVERSION;  Surgeon: Josue Hector, MD;  Location: [facility] ENDOSCOPY;  Service: Cardiovascular;  Laterality: N/A;  . CARDIOVERSION N/A 03/20/2017   Procedure: CARDIOVERSION;  Surgeon: Fay Records, MD;  Location: [facility] ENDOSCOPY;  Service: Cardiovascular;  Laterality: N/A;  . ELECTROPHYSIOLOGIC STUDY N/A 12/28/2014   Procedure: CARDIOVERSION;  Surgeon: Corey Skains, MD;  Location: [facility] ORS;  Service: Cardiovascular;  Laterality: N/A;  . ELECTROPHYSIOLOGIC STUDY N/A 09/07/2015   Procedure: CARDIOVERSION;  Surgeon: Corey Skains, MD;  Location: [facility] ORS;  Service: Cardiovascular;  Laterality: N/A;  . ELECTROPHYSIOLOGIC STUDY N/A 03/18/2016   Procedure: Atrial Fibrillation Ablation;  Surgeon: [name], MD;  Location: [place] [facility];  Service: Cardiovascular;  Laterality: N/A;  . RIGHT/LEFT HEART CATH AND CORONARY ANGIOGRAPHY N/A 08/06/2017   Procedure: RIGHT/LEFT HEART CATH AND CORONARY ANGIOGRAPHY;  Surgeon: Martinique, Peter M, MD;  Location: [place] [facility];  Service: Cardiovascular;  Laterality: N/A;  . TEE WITHOUT CARDIOVERSION N/A 09/07/2015   Procedure:  TRANSESOPHAGEAL ECHOCARDIOGRAM (TEE);  Surgeon: Corey Skains, MD;  Location: [facility] ORS;  Service: Cardiovascular;  Laterality: N/A;  . TEE WITHOUT CARDIOVERSION N/A 03/17/2016   Procedure: TRANSESOPHAGEAL ECHOCARDIOGRAM (TEE);  Surgeon: Lelon Perla, MD;  Location: [facility] ENDOSCOPY;  Service: Cardiovascular;  Laterality: N/A;  . TEE WITHOUT CARDIOVERSION N/A 12/24/2016   Procedure: TRANSESOPHAGEAL ECHOCARDIOGRAM (TEE);  Surgeon: Josue Hector, MD;  Location: [facility] ENDOSCOPY;  Service: Cardiovascular;  Laterality: N/A;  . TEE WITHOUT CARDIOVERSION N/A 05/29/2017   Procedure: TRANSESOPHAGEAL ECHOCARDIOGRAM (TEE);  Surgeon: Lelon Perla, MD;  Location: [facility] ENDOSCOPY;  Service: Cardiovascular;  Laterality: N/A;    Prior to Admission medications   Medication Sig Start Date End Date Taking? Authorizing Provider  acetaminophen (TYLENOL) 500 MG tablet Take 1,000 mg by mouth 2 (two) times daily as needed for mild pain or moderate pain.    Yes [provider]  buPROPion (WELLBUTRIN XL) 150 MG 24 hr tablet Take by mouth. 07/06/17  Yes [provider]  ELIQUIS 5 MG TABS tablet TAKE 1 TABLET (5 MG TOTAL) 2 (TWO) TIMES DAILY BY MOUTH. 11/02/18  Yes Allred, Jeneen Rinks, MD  flecainide (TAMBOCOR) 50 MG tablet TAKE 1 TABLET BY MOUTH TWICE A DAY 11/02/18  Yes Allred, Jeneen Rinks, MD  fluticasone Asencion Islam) 50 MCG/ACT nasal spray  11/30/18  Yes [provider]  imipramine (TOFRANIL) 25 MG tablet  11/30/18  Yes [provider]  tadalafil (CIALIS) 5 MG tablet Take 1 tablet (5 mg total) by mouth daily as needed for erectile dysfunction. 08/05/18  Yes Allred, Jeneen Rinks, MD  metoprolol succinate (TOPROL-XL) 50 MG 24 hr tablet Take  1 tablet (50 mg total) by mouth daily. Patient not taking: Reported on 12/10/2018 11/26/17   [name], MD    Allergies as of 11/12/2018  . (No Known Allergies)    History reviewed. No pertinent family history.  Social History   Socioeconomic History  . Marital  status: Married    Spouse name: Not on file  . Number of children: Not on file  . Years of education: Not on file  . Highest education level: Not on file  Occupational History  . Not on file  Social Needs  . Financial resource strain: Not on file  . Food insecurity    Worry: Not on file    Inability: Not on file  . Transportation needs    Medical: Not on file    Non-medical: Not on file  Tobacco Use  . Smoking status: Never Smoker  . Smokeless tobacco: Never Used  Substance and Sexual Activity  . Alcohol use: No  . Drug use: No  . Sexual activity: Not on file  Lifestyle  . Physical activity    Days per week: Not on file    Minutes per session: Not on file  . Stress: Not on file  Relationships  . Social Herbalist on phone: Not on file    Gets together: Not on file    Attends religious service: Not on file    Active member of club or organization: Not on file    Attends meetings of clubs or organizations: Not on file    Relationship status: Not on file  . Intimate partner violence    Fear of current or ex partner: Not on file    Emotionally abused: Not on file    Physically abused: Not on file    Forced sexual activity: Not on file  Other Topics Concern  . Not on file  Social History Narrative   Pt lives in [place] with spouse.  Works as a Chief Strategy Officer.    Review of Systems: See HPI, otherwise negative ROS  Physical Exam: BP 116/86 (BP Location: Left Arm) Comment: Simultaneous filing. User may not have seen previous data.  Pulse 86 Comment: Simultaneous filing. User may not have seen previous data.  Temp 97.6 F (36.4 C) (Tympanic) Comment: Simultaneous filing. User may not have seen previous data.  Resp 17 Comment: Simultaneous filing. User may not have seen previous data.  Ht 5\' 10"  (1.778 m)   Wt 97.5 kg   SpO2 96% Comment: Simultaneous filing. User may not have seen previous data.  BMI 30.85 kg/m  General:   Alert,  pleasant and cooperative in  NAD Head:  Normocephalic and atraumatic. Neck:  Supple; no masses or thyromegaly. Lungs:  Clear throughout to auscultation, normal respiratory effort.    Heart:  +S1, +S2, Regular rate and rhythm, No edema. Abdomen:  Soft, nontender and nondistended. Normal bowel sounds, without guarding, and without rebound.   Neurologic:  Alert and  oriented x4;  grossly normal neurologically.  Impression/Plan: Francisco Reed is here for an colonoscopy to be performed for Screening colonoscopy average risk   Risks, benefits, limitations, and alternatives regarding  colonoscopy have been reviewed with the patient.  Questions have been answered.  All parties agreeable.   Francisco Bellows, MD  12/10/2018, 11:16 AM

## 2018-12-10 NOTE — Anesthesia Preprocedure Evaluation (Signed)
Anesthesia Evaluation  Patient identified by MRN, date of birth, ID band Patient awake    Reviewed: Allergy & Precautions, H&P , NPO status , Patient's Chart, lab work & pertinent test results  History of Anesthesia Complications Negative for: history of anesthetic complications  Airway Mallampati: II  TM Distance: >3 FB Neck ROM: Full    Dental  (+) Chipped   Pulmonary sleep apnea ,    breath sounds clear to auscultation       Cardiovascular Exercise Tolerance: Good (-) angina+ dysrhythmias Atrial Fibrillation      Neuro/Psych negative neurological ROS  negative psych ROS   GI/Hepatic Neg liver ROS, GERD  ,  Endo/Other  negative endocrine ROS  Renal/GU negative Renal ROS  negative genitourinary   Musculoskeletal   Abdominal   Peds  Hematology negative hematology ROS (+)   Anesthesia Other Findings Past Medical History: No date: Chronic systolic dysfunction of left ventricle No date: Obstructive sleep apnea     Comment:  mild No date: Persistent atrial fibrillation ([place])  Past Surgical History: 05/29/2017: ATRIAL FIBRILLATION ABLATION; N/A     Comment:  Procedure: ATRIAL FIBRILLATION ABLATION;  Surgeon:               [name], MD;  Location: [place] [facility];                Service: Cardiovascular;  Laterality: N/A; No date: CARDIAC CATHETERIZATION No date: CARDIOVERSION 12/24/2016: CARDIOVERSION; N/A     Comment:  Procedure: CARDIOVERSION;  Surgeon: Josue Hector, MD;              Location: [place] ENDOSCOPY;  Service: Cardiovascular;                Laterality: N/A; 03/20/2017: CARDIOVERSION; N/A     Comment:  Procedure: CARDIOVERSION;  Surgeon: Fay Records, MD;                Location: [facility] ENDOSCOPY;  Service: Cardiovascular;                Laterality: N/A; 12/28/2014: ELECTROPHYSIOLOGIC STUDY; N/A     Comment:  Procedure: CARDIOVERSION;  Surgeon: Corey Skains,               MD;  Location: [place]  ORS;  Service: Cardiovascular;                Laterality: N/A; 09/07/2015: ELECTROPHYSIOLOGIC STUDY; N/A     Comment:  Procedure: CARDIOVERSION;  Surgeon: Corey Skains,               MD;  Location: [facility] ORS;  Service: Cardiovascular;                Laterality: N/A; 03/18/2016: ELECTROPHYSIOLOGIC STUDY; N/A     Comment:  Procedure: Atrial Fibrillation Ablation;  Surgeon: [name], MD;  Location: [place] [facility];  Service:               Cardiovascular;  Laterality: N/A; 08/06/2017: RIGHT/LEFT HEART CATH AND CORONARY ANGIOGRAPHY; N/A     Comment:  Procedure: RIGHT/LEFT HEART CATH AND CORONARY               ANGIOGRAPHY;  Surgeon: Martinique, Peter M, MD;  Location: [place] [facility];  Service: Cardiovascular;  Laterality:  N/A; 09/07/2015: TEE WITHOUT CARDIOVERSION; N/A     Comment:  Procedure: TRANSESOPHAGEAL ECHOCARDIOGRAM (TEE);                Surgeon: Corey Skains, MD;  Location: [facility] ORS;                Service: Cardiovascular;  Laterality: N/A; 03/17/2016: TEE WITHOUT CARDIOVERSION; N/A     Comment:  Procedure: TRANSESOPHAGEAL ECHOCARDIOGRAM (TEE);                Surgeon: Lelon Perla, MD;  Location: [facility] ENDOSCOPY;                Service: Cardiovascular;  Laterality: N/A; 12/24/2016: TEE WITHOUT CARDIOVERSION; N/A     Comment:  Procedure: TRANSESOPHAGEAL ECHOCARDIOGRAM (TEE);                Surgeon: Josue Hector, MD;  Location: [facility] ENDOSCOPY;                Service: Cardiovascular;  Laterality: N/A; 05/29/2017: TEE WITHOUT CARDIOVERSION; N/A     Comment:  Procedure: TRANSESOPHAGEAL ECHOCARDIOGRAM (TEE);                Surgeon: Lelon Perla, MD;  Location: [facility] ENDOSCOPY;               Service: Cardiovascular;  Laterality: N/A;  BMI    Body Mass Index: 30.85 kg/m      Reproductive/Obstetrics negative OB ROS                             Anesthesia Physical  Anesthesia Plan  ASA: III  Anesthesia  Plan: General   Post-op Pain Management:    Induction: Intravenous  PONV Risk Score and Plan: Propofol infusion and TIVA  Airway Management Planned: Natural Airway and Nasal Cannula  Additional Equipment:   Intra-op Plan:   Post-operative Plan:   Informed Consent: I have reviewed the patients History and Physical, chart, labs and discussed the procedure including the risks, benefits and alternatives for the proposed anesthesia with the patient or authorized representative who has indicated his/her understanding and acceptance.     Dental advisory given  Plan Discussed with: Anesthesiologist and CRNA  Anesthesia Plan Comments: (Patient consented for risks of anesthesia including but not limited to:  - adverse reactions to medications - risk of intubation if required - damage to teeth, lips or other oral mucosa - sore throat or hoarseness - Damage to heart, brain, lungs or loss of life  Patient voiced understanding.)        Anesthesia Quick Evaluation

## 2018-12-13 ENCOUNTER — Encounter: Payer: Self-pay | Admitting: Gastroenterology

## 2018-12-13 LAB — SURGICAL PATHOLOGY

## 2018-12-15 ENCOUNTER — Encounter: Payer: Self-pay | Admitting: Gastroenterology

## 2019-03-07 ENCOUNTER — Telehealth: Payer: Self-pay | Admitting: Gastroenterology

## 2019-03-07 NOTE — Telephone Encounter (Signed)
Pt is calling stating Dr. Doy Hutching spoke with Dr. Vicente Males about the procedure code on his colonoscopy to code it to a screening not a DX pt is checking on that

## 2019-03-09 ENCOUNTER — Telehealth: Payer: Self-pay | Admitting: Gastroenterology

## 2019-03-09 NOTE — Telephone Encounter (Addendum)
Called pt to inform him of Jody's statement that she sent the request to change the procedure coding to the billing dept on 02-22-19 to refile with insurance and will need time to be reprocessed.  Unable to contact, LVM to return call

## 2019-03-09 NOTE — Telephone Encounter (Signed)
Patient called & l/m on v/m he was returning call to [employer].

## 2019-03-09 NOTE — Telephone Encounter (Signed)
Dr. Doy Hutching called to get an update on pt's procedure coding change. I informed him of Jody's statement, he understands and plans to inform the pt himself.

## 2019-03-09 NOTE — Telephone Encounter (Signed)
Returned pt's call to enquire if he received a call from Dr. Doy Hutching today. Pt states he did speak with Dr. Doy Hutching and is aware that [employer] has submitted a request to the billing department to change the procedure coding.

## 2019-03-24 ENCOUNTER — Telehealth: Payer: Self-pay | Admitting: Gastroenterology

## 2019-03-24 NOTE — Telephone Encounter (Signed)
Patient called after calling [facility] about his account for his colonoscopy done by Dr Vicente Males. Jody had sent a request to the billing but they only changed the doctor's coding not the hospitals. I have given this information to Jody to follow-up on. The patient states the billing department has given him 30 days to have corrected.

## 2019-12-16 ENCOUNTER — Other Ambulatory Visit: Payer: Self-pay | Admitting: Internal Medicine

## 2020-01-07 ENCOUNTER — Other Ambulatory Visit: Payer: Self-pay | Admitting: Internal Medicine

## 2020-01-10 ENCOUNTER — Other Ambulatory Visit: Payer: Self-pay | Admitting: Internal Medicine

## 2020-01-10 MED ORDER — APIXABAN 5 MG PO TABS: ORAL_TABLET | ORAL | 1 refills | Status: DC

## 2020-01-10 MED ORDER — FLECAINIDE ACETATE 50 MG PO TABS: 50.0000 mg | ORAL_TABLET | Freq: Two times a day (BID) | ORAL | 1 refills | Status: DC

## 2020-01-10 NOTE — Telephone Encounter (Signed)
°*  STAT* If patient is at the pharmacy, call can be transferred to refill team.   1. Which medications need to be refilled? (please list name of each medication and dose if known) flecainide (TAMBOCOR) 50 MG tablet  2. Which pharmacy/location (including street and city if local pharmacy) is medication to be sent to? [id number] - [place], [place] - [address]  3. Do they need a 30 day or 90 day supply? 90 day supply  Patient stated he has a 1 week supply of medication remaining and he scheduled an appointment for 02/20/20 at 9:15 AM with Dr. Rayann Heman.

## 2020-01-10 NOTE — Telephone Encounter (Signed)
Pt's medication was sent to pt's pharmacy as requested. Confirmation received. Pt also need a refill on his Eliquis. Pt has an upcoming appt on 02/20/20 with Dr. Rayann Heman. Please address

## 2020-01-10 NOTE — Telephone Encounter (Signed)
Pt was last seen by Roderic Palau, NP 09/27/18, pt is overdue for follow-up.  Pt has appt scheduled with Dr Rayann Heman 02/20/20.  Last labs 11/30/18 Creat 0.9 at [facility] per care everywhere, pt is overdue for bloodwork. Age 59, weight 97.5kg, based on specified criteria pt is on appropriate dosage of Eliquis 5mg  BID.  Will refill rx to get pt to upcoming appt with Dr Rayann Heman.  Will place a note on appt to draw CBC and BMP at [place]. As a note last Eliquis refill authorized in chart by [place] was 11/02/18 for 60 tablets with no refills.  Unsure who has been refilling or if pt has been taking.

## 2020-01-20 ENCOUNTER — Ambulatory Visit: Payer: BC Managed Care – PPO | Attending: Orthopedic Surgery | Admitting: Occupational Therapy

## 2020-01-20 ENCOUNTER — Encounter: Payer: Self-pay | Admitting: Occupational Therapy

## 2020-01-20 ENCOUNTER — Other Ambulatory Visit: Payer: Self-pay

## 2020-01-20 DIAGNOSIS — L905 Scar conditions and fibrosis of skin: Secondary | ICD-10-CM | POA: Insufficient documentation

## 2020-01-20 DIAGNOSIS — M25641 Stiffness of right hand, not elsewhere classified: Secondary | ICD-10-CM | POA: Diagnosis present

## 2020-01-20 DIAGNOSIS — R600 Localized edema: Secondary | ICD-10-CM | POA: Insufficient documentation

## 2020-01-20 DIAGNOSIS — M6281 Muscle weakness (generalized): Secondary | ICD-10-CM | POA: Diagnosis present

## 2020-01-20 NOTE — Patient Instructions (Signed)
3 x day  Contrast - but use hot and cold packs  Thumb PA and RA AROM 10 reps Blocked AROM for IP and MC of thumb  10 reps Opposition - pick up 2 cm foam block - alternate digits- 5-8 reps  Circular AROM to thumb - both ways  10 reps Keep pain under 2/10   Digit stockinet on thumb -and coban wrap from distal to [facility] of thumb   to decrease edema and cushion thumb to use in light activities - like picking up objects - less than 1 lbs -and pain should be less than 2/10  Precautions review -for coban wrap -  And no picking up heavy objects more than 1lbs , pinch , squeeze or hold tight with thumb pressure

## 2020-01-20 NOTE — Therapy (Signed)
[place] [facility] [address] [place], [address] Phone: [phone]   Fax:  [phone]  Occupational Therapy Evaluation  Patient Details  Name: Francisco Reed MRN: [id number] Date of Birth: 1960-05-12 Referring Provider (OT): Dr Rudene Christians   Encounter Date: 01/20/2020   OT End of Session - 01/20/20 1006    Visit Number 1    Number of Visits 14    Date for OT Re-Evaluation 03/09/20    OT Start Time 0833    OT Stop Time 0930    OT Time Calculation (min) 57 min    Activity Tolerance Patient tolerated treatment well    Behavior During Therapy [facility] for tasks assessed/performed           Past Medical History:  Diagnosis Date  . Chronic systolic dysfunction of left ventricle   . Obstructive sleep apnea    mild  . Persistent atrial fibrillation [facility])     Past Surgical History:  Procedure Laterality Date  . ATRIAL FIBRILLATION ABLATION N/A 05/29/2017   Procedure: ATRIAL FIBRILLATION ABLATION;  Surgeon: [name], MD;  Location: [place] CV LAB;  Service: Cardiovascular;  Laterality: N/A;  . CARDIAC CATHETERIZATION    . CARDIOVERSION    . CARDIOVERSION N/A 12/24/2016   Procedure: CARDIOVERSION;  Surgeon: Josue Hector, MD;  Location: [facility] ENDOSCOPY;  Service: Cardiovascular;  Laterality: N/A;  . CARDIOVERSION N/A 03/20/2017   Procedure: CARDIOVERSION;  Surgeon: Fay Records, MD;  Location: [place];  Service: Cardiovascular;  Laterality: N/A;  . COLONOSCOPY WITH PROPOFOL N/A 12/10/2018   Procedure: COLONOSCOPY WITH PROPOFOL;  Surgeon: Jonathon Bellows, MD;  Location: [facility] ENDOSCOPY;  Service: Gastroenterology;  Laterality: N/A;  . ELECTROPHYSIOLOGIC STUDY N/A 12/28/2014   Procedure: CARDIOVERSION;  Surgeon: Corey Skains, MD;  Location: [facility] ORS;  Service: Cardiovascular;  Laterality: N/A;  . ELECTROPHYSIOLOGIC STUDY N/A 09/07/2015   Procedure: CARDIOVERSION;  Surgeon: Corey Skains, MD;  Location: [facility]  ORS;  Service: Cardiovascular;  Laterality: N/A;  . ELECTROPHYSIOLOGIC STUDY N/A 03/18/2016   Procedure: Atrial Fibrillation Ablation;  Surgeon: [name], MD;  Location: [place] CV LAB;  Service: Cardiovascular;  Laterality: N/A;  . RIGHT/LEFT HEART CATH AND CORONARY ANGIOGRAPHY N/A 08/06/2017   Procedure: RIGHT/LEFT HEART CATH AND CORONARY ANGIOGRAPHY;  Surgeon: Martinique, Peter M, MD;  Location: [place] CV LAB;  Service: Cardiovascular;  Laterality: N/A;  . TEE WITHOUT CARDIOVERSION N/A 09/07/2015   Procedure: TRANSESOPHAGEAL ECHOCARDIOGRAM (TEE);  Surgeon: Corey Skains, MD;  Location: [facility] ORS;  Service: Cardiovascular;  Laterality: N/A;  . TEE WITHOUT CARDIOVERSION N/A 03/17/2016   Procedure: TRANSESOPHAGEAL ECHOCARDIOGRAM (TEE);  Surgeon: Lelon Perla, MD;  Location: [facility] ENDOSCOPY;  Service: Cardiovascular;  Laterality: N/A;  . TEE WITHOUT CARDIOVERSION N/A 12/24/2016   Procedure: TRANSESOPHAGEAL ECHOCARDIOGRAM (TEE);  Surgeon: Josue Hector, MD;  Location: [facility] ENDOSCOPY;  Service: Cardiovascular;  Laterality: N/A;  . TEE WITHOUT CARDIOVERSION N/A 05/29/2017   Procedure: TRANSESOPHAGEAL ECHOCARDIOGRAM (TEE);  Surgeon: Lelon Perla, MD;  Location: [facility] ENDOSCOPY;  Service: Cardiovascular;  Laterality: N/A;    There were no vitals filed for this visit.   Subjective Assessment - 01/20/20 0955    Subjective  I don't use my thumb - just my fingers - stop soaking it earlier this week will get it wet in shower - but just very stiff    Pertinent History right thumb deep laceration with open fracture from a table saw injury. healed on own -  had antibiotics, and then COVID few weeks ago -had treatment for it The date of injury was 12/16/2019. Seen Dr Rudene Christians 01/18/2020 - stop soaking , can keep open - refer to OT    Patient Stated Goals I am R handed- want my motion and use of R thumb back - I am contractor , play golf, hunt and fish    Currently in Pain? Yes    Pain Score 2    increase  to 5/10 attempt to use   Pain Location --   Thumb   Pain Orientation Right    Pain Descriptors / Indicators Aching;Tightness;Tender;Throbbing    Pain Type Acute pain    Pain Onset More than a month ago    Pain Frequency Intermittent             [facility] OT Assessment - 01/20/20 0001      Assessment   Medical Diagnosis R thumb Tuft fracture    Referring Provider (OT) Dr Rudene Christians    Onset Date/Surgical Date 12/15/19    Hand Dominance Right    Next MD Visit --   20th Dec      Precautions   Precaution Comments --   fracture healing and protect soft tissue healing      Home  Environment   Lives With Spouse      Prior Function   Vocation Full time employment    Leisure General contracture, play golf , fish and hunt       Edema   Edema Thumb R thumb increase by 0.7 cm       Right Hand AROM   R Thumb MCP 0-60 25 Degrees    R Thumb IP 0-80 10 Degrees    R Thumb Radial ABduction/ADduction 0-55 43    R Thumb Palmar ABduction/ADduction 0-45 54    R Thumb Opposition to Index --   to 3rd digit- pain 5/10 and 1 cm lacking to 4th      Left Hand AROM   L Thumb MCP 0-60 35 Degrees    L Thumb IP 0-80 55 Degrees    L Thumb Radial ADduction/ABduction 0-55 54    L Thumb Palmar ADduction/ABduction 0-45 64    L Thumb Opposition to Index --   to base of 5th                    OT Treatments/Exercises (OP) - 01/20/20 0001      RUE Contrast Bath   Time 8 minutes    Comments decrease edema , increase ROM - prior to review of HEP            3 x day  Contrast - but use hot and cold packs  Thumb PA and RA AROM 10 reps Blocked AROM for IP and MC of thumb  10 reps Opposition - pick up 2 cm foam block - alternate digits- 5-8 reps  Circular AROM to thumb - both ways  10 reps Keep pain under 2/10   Digit stockinet on thumb -and coban wrap from distal to [facility] of thumb   to decrease edema and cushion thumb to use in light activities - like picking up objects - less than 1 lbs -and  pain should be less than 2/10  Precautions review -for coban wrap -  And no picking up heavy objects more than 1lbs , pinch , squeeze or hold tight with thumb pressure         OT Education - 01/20/20  1006    Education Details findings of eval and HEP    Person(s) Educated Patient    Methods Explanation;Demonstration;Tactile cues;Verbal cues;Handout    Comprehension Verbal cues required;Returned demonstration;Verbalized understanding            OT Short Term Goals - 01/20/20 1312      OT SHORT TERM GOAL #1   Title Pt be independent in HEP to increase AROM , functional use and decrease scar tissue and hyper sensitivity    Baseline Eschar covering most of IP of R thumb - with Vicryl for the nail bed repair,at ED - IP flexion 10, MC 25- not using thumb    Time 4    Period Weeks    Status New    Target Date 02/17/20             OT Long Term Goals - 01/20/20 1320      OT LONG TERM GOAL #1   Title R thumb AROM increase for pt to do opposition to 5th to retrieve objects of 2 cm out of palm    Baseline decrease IP and MC flexion- 10 and 25 degrees    Time 4    Period Weeks    Status New    Target Date 02/10/20      OT LONG TERM GOAL #2   Title R prehension grip increase to more than 75% compare to L to hold tools, golf club, fishing pole    Baseline NT - 5 wks out from injury - eschar and  Vicryl for the nail bed repair,in place    Time 7    Period Weeks    Status New    Target Date 03/09/20      OT LONG TERM GOAL #3   Title Functional use of R hand improve and using thumb with more than 25 points    Baseline pt score for function on PRWHE 33/50    Time 7    Period Weeks    Status New    Target Date 03/09/20                 Plan - 01/20/20 1007    Clinical Impression Statement Pt is about 5 wks out from saw accident to R thumb - Tuft fracture - pt present with dry black eschar cap on thumb and well adhere to underlying subcutaneous tissue - pt to ed on  using coban wrap to decrease pain and cushioining for use in light activities but no squeezing, pulling , pinching with thumb - pt with stiffness in all joints of thumb , edema increase 0.7 cm - all limiting his use of dominant hand and thumb -  pt ed on AROM , edema control    OT Occupational Profile and History Problem Focused Assessment - Including review of records relating to presenting problem    Occupational performance deficits (Please refer to evaluation for details): ADL's;IADL's;Work;Play;Leisure;Social Participation    Body Structure / Function / Physical Skills ADL;Coordination;Decreased knowledge of precautions;Flexibility;ROM;UE functional use;Scar mobility;FMC;IADL;Pain;Strength;Wound;Skin integrity;Sensation    Rehab Potential Good    Clinical Decision Making Limited treatment options, no task modification necessary    Comorbidities Affecting Occupational Performance: None    Modification or Assistance to Complete Evaluation  No modification of tasks or assist necessary to complete eval    OT Frequency 2x / week    OT Duration --   7 wks   OT Treatment/Interventions Self-care/ADL training;Paraffin;Fluidtherapy;Contrast Bath;Therapeutic exercise;Manual Therapy;Passive range of motion;Scar mobilization;Splinting;Patient/family  education    Plan assess progress with HEP    OT Home Exercise Plan see pt instructions    Consulted and Agree with Plan of Care Patient           Patient will benefit from skilled therapeutic intervention in order to improve the following deficits and impairments:   Body Structure / Function / Physical Skills: ADL, Coordination, Decreased knowledge of precautions, Flexibility, ROM, UE functional use, Scar mobility, FMC, IADL, Pain, Strength, Wound, Skin integrity, Sensation       Visit Diagnosis: Scar condition and fibrosis of skin - Plan: Ot plan of care cert/re-cert  Stiffness of right hand, not elsewhere classified - Plan: Ot plan of care  cert/re-cert  Localized edema - Plan: Ot plan of care cert/re-cert  Muscle weakness (generalized) - Plan: Ot plan of care cert/re-cert    Problem List Patient Active Problem List   Diagnosis Date Noted  . Chest pain 08/06/2017  . Persistent atrial fibrillation ([place]) 03/18/2016  . A-fib ([place]) 03/18/2016  . Bilateral leg edema 09/19/2015  . Bradycardia 03/23/2014  . Paroxysmal atrial fibrillation ([place]) 01/27/2014  . Obstructive sleep apnea 10/13/2013  . Cardiomyopathy ([place]) 07/22/2013  . Gastro-esophageal reflux disease without esophagitis 07/22/2013  . Mixed hyperlipidemia 07/22/2013    Rosalyn Gess  OTR/l,CLT 01/20/2020, 1:29 PM  [place] [facility] [address] [place], [address] Phone: [phone]   Fax:  [phone]  Name: Francisco Reed MRN: [id number] Date of Birth: 1960/04/23

## 2020-01-23 ENCOUNTER — Other Ambulatory Visit: Payer: Self-pay

## 2020-01-23 ENCOUNTER — Ambulatory Visit: Payer: BC Managed Care – PPO | Admitting: Occupational Therapy

## 2020-01-23 DIAGNOSIS — M6281 Muscle weakness (generalized): Secondary | ICD-10-CM

## 2020-01-23 DIAGNOSIS — R600 Localized edema: Secondary | ICD-10-CM

## 2020-01-23 DIAGNOSIS — M25641 Stiffness of right hand, not elsewhere classified: Secondary | ICD-10-CM

## 2020-01-23 DIAGNOSIS — L905 Scar conditions and fibrosis of skin: Secondary | ICD-10-CM | POA: Diagnosis not present

## 2020-01-23 NOTE — Patient Instructions (Signed)
Same HEP -but can add MC flexion of thumb AAROM

## 2020-01-23 NOTE — Therapy (Signed)
[place] [facility] [address] [place], [address] Phone: [phone]   Fax:  [phone]  Occupational Therapy Treatment  Patient Details  Name: Francisco Reed MRN: [id number] Date of Birth: Mar 11, 1960 Referring Provider (OT): Dr Rudene Christians   Encounter Date: 01/23/2020   OT End of Session - 01/23/20 1501    Visit Number 2    Number of Visits 14    Date for OT Re-Evaluation 03/09/20    OT Start Time 1403    OT Stop Time 1445    OT Time Calculation (min) 42 min    Activity Tolerance Patient tolerated treatment well    Behavior During Therapy [facility] for tasks assessed/performed           Past Medical History:  Diagnosis Date  . Chronic systolic dysfunction of left ventricle   . Obstructive sleep apnea    mild  . Persistent atrial fibrillation [facility])     Past Surgical History:  Procedure Laterality Date  . ATRIAL FIBRILLATION ABLATION N/A 05/29/2017   Procedure: ATRIAL FIBRILLATION ABLATION;  Surgeon: [name], MD;  Location: [place] CV LAB;  Service: Cardiovascular;  Laterality: N/A;  . CARDIAC CATHETERIZATION    . CARDIOVERSION    . CARDIOVERSION N/A 12/24/2016   Procedure: CARDIOVERSION;  Surgeon: Josue Hector, MD;  Location: [facility] ENDOSCOPY;  Service: Cardiovascular;  Laterality: N/A;  . CARDIOVERSION N/A 03/20/2017   Procedure: CARDIOVERSION;  Surgeon: Fay Records, MD;  Location: [place];  Service: Cardiovascular;  Laterality: N/A;  . COLONOSCOPY WITH PROPOFOL N/A 12/10/2018   Procedure: COLONOSCOPY WITH PROPOFOL;  Surgeon: Jonathon Bellows, MD;  Location: [facility] ENDOSCOPY;  Service: Gastroenterology;  Laterality: N/A;  . ELECTROPHYSIOLOGIC STUDY N/A 12/28/2014   Procedure: CARDIOVERSION;  Surgeon: Corey Skains, MD;  Location: [facility] ORS;  Service: Cardiovascular;  Laterality: N/A;  . ELECTROPHYSIOLOGIC STUDY N/A 09/07/2015   Procedure: CARDIOVERSION;  Surgeon: Corey Skains, MD;  Location: [facility]  ORS;  Service: Cardiovascular;  Laterality: N/A;  . ELECTROPHYSIOLOGIC STUDY N/A 03/18/2016   Procedure: Atrial Fibrillation Ablation;  Surgeon: [name], MD;  Location: [place] CV LAB;  Service: Cardiovascular;  Laterality: N/A;  . RIGHT/LEFT HEART CATH AND CORONARY ANGIOGRAPHY N/A 08/06/2017   Procedure: RIGHT/LEFT HEART CATH AND CORONARY ANGIOGRAPHY;  Surgeon: Martinique, Peter M, MD;  Location: [place] CV LAB;  Service: Cardiovascular;  Laterality: N/A;  . TEE WITHOUT CARDIOVERSION N/A 09/07/2015   Procedure: TRANSESOPHAGEAL ECHOCARDIOGRAM (TEE);  Surgeon: Corey Skains, MD;  Location: [facility] ORS;  Service: Cardiovascular;  Laterality: N/A;  . TEE WITHOUT CARDIOVERSION N/A 03/17/2016   Procedure: TRANSESOPHAGEAL ECHOCARDIOGRAM (TEE);  Surgeon: Lelon Perla, MD;  Location: [facility] ENDOSCOPY;  Service: Cardiovascular;  Laterality: N/A;  . TEE WITHOUT CARDIOVERSION N/A 12/24/2016   Procedure: TRANSESOPHAGEAL ECHOCARDIOGRAM (TEE);  Surgeon: Josue Hector, MD;  Location: [facility] ENDOSCOPY;  Service: Cardiovascular;  Laterality: N/A;  . TEE WITHOUT CARDIOVERSION N/A 05/29/2017   Procedure: TRANSESOPHAGEAL ECHOCARDIOGRAM (TEE);  Surgeon: Lelon Perla, MD;  Location: [facility] ENDOSCOPY;  Service: Cardiovascular;  Laterality: N/A;    There were no vitals filed for this visit.   Subjective Assessment - 01/23/20 1459    Subjective  I did use my hand a lot this weekend building things - my thumb did drain on the bandage some and stuck on it and pulled some of the scab - did not do the heat - did it once -done some of the exercises  Pertinent History right thumb deep laceration with open fracture from a table saw injury. healed on own - had antibiotics, and then COVID few weeks ago -had treatment for it The date of injury was 12/16/2019. Seen Dr Rudene Christians 01/18/2020 - stop soaking , can keep open - refer to OT    Patient Stated Goals I am R handed- want my motion and use of R thumb back - I am contractor ,  play golf, hunt and fish    Currently in Pain? Yes    Pain Location Finger (Comment which one)    Pain Orientation Right    Pain Descriptors / Indicators Aching;Tightness;Tender    Pain Type Acute pain    Pain Onset More than a month ago    Pain Frequency Intermittent    Aggravating Factors  bending thumb              [facility] OT Assessment - 01/23/20 0001      Right Hand AROM   R Thumb MCP 0-60 30 Degrees    R Thumb IP 0-80 12 Degrees    R Thumb Radial ABduction/ADduction 0-55 48    R Thumb Palmar ABduction/ADduction 0-45 70            Pt AROM at thumb IP about same- pt report doing heat and cold only once this past weekend  And had some drainage on thumb          OT Treatments/Exercises (OP) - 01/23/20 0001      RUE Contrast Bath   Time 8 minutes    Comments decrease edema , increase ROM -review with pt again to do at home           reinforce with pt importance of using doing  2- 3 x day  Contrast - but use hot and cold packs   Thumb PA and RA AROM 10 reps - bring IP of thumb in during RA Blocked AROM for IP and  Can do AAROM for MC of thumb  10 reps Opposition - pick up 2 cm foam block - alternate digits- 5-8 reps  Circular AROM to thumb - both ways  10 reps Keep pain under 2/10   IP flexion increase to 20 degrees and MC to 35 after contrast   Digit stockinet on thumb -and coban wrap from distal to [facility] of thumb  -if see drainage - use vaseline or xeroform under neath to prevent sticking to scab and pulling it off    Coban to decrease edema and cushion thumb to use in light activities - like picking up objects - less than 1 lbs -and pain should be less than 2/10  Precautions review -for coban wrap -  And NO picking up heavy objects more than 1lbs , pinch , squeeze or hold tight with thumb pressure        OT Education - 01/23/20 1501    Education Details progress and HEP    Person(s) Educated Patient    Methods Explanation;Demonstration;Tactile  cues;Verbal cues;Handout    Comprehension Verbal cues required;Returned demonstration;Verbalized understanding            OT Short Term Goals - 01/20/20 1312      OT SHORT TERM GOAL #1   Title Pt be independent in HEP to increase AROM , functional use and decrease scar tissue and hyper sensitivity    Baseline Eschar covering most of IP of R thumb - with Vicryl for the nail bed repair,at ED - IP  flexion 10, MC 25- not using thumb    Time 4    Period Weeks    Status New    Target Date 02/17/20             OT Long Term Goals - 01/20/20 1320      OT LONG TERM GOAL #1   Title R thumb AROM increase for pt to do opposition to 5th to retrieve objects of 2 cm out of palm    Baseline decrease IP and MC flexion- 10 and 25 degrees    Time 4    Period Weeks    Status New    Target Date 02/10/20      OT LONG TERM GOAL #2   Title R prehension grip increase to more than 75% compare to L to hold tools, golf club, fishing pole    Baseline NT - 5 wks out from injury - eschar and  Vicryl for the nail bed repair,in place    Time 7    Period Weeks    Status New    Target Date 03/09/20      OT LONG TERM GOAL #3   Title Functional use of R hand improve and using thumb with more than 25 points    Baseline pt score for function on PRWHE 33/50    Time 7    Period Weeks    Status New    Target Date 03/09/20                 Plan - 01/23/20 1502    Clinical Impression Statement Pt is about 5 1/2 wks out from saw accident to R thumb - tuft fracture - pt report had some drainage at thumb over the weekend - ? if used to much - pt can put on little xeroform/vaseline for little bit when drainage to prevent stucking to scab - but other wise need to do contrast - with packs to allow increase AROM of thumb - can use in light activites -but not pressure , squeezing , pushing , pulling with thumb - healing under scab happening    OT Occupational Profile and History Problem Focused Assessment -  Including review of records relating to presenting problem    Occupational performance deficits (Please refer to evaluation for details): ADL's;IADL's;Work;Play;Leisure;Social Participation    Body Structure / Function / Physical Skills ADL;Coordination;Decreased knowledge of precautions;Flexibility;ROM;UE functional use;Scar mobility;FMC;IADL;Pain;Strength;Wound;Skin integrity;Sensation    Rehab Potential Good    Clinical Decision Making Limited treatment options, no task modification necessary    Comorbidities Affecting Occupational Performance: None    Modification or Assistance to Complete Evaluation  No modification of tasks or assist necessary to complete eval    OT Frequency 2x / week    OT Duration 6 weeks    OT Treatment/Interventions Self-care/ADL training;Paraffin;Fluidtherapy;Contrast Bath;Therapeutic exercise;Manual Therapy;Passive range of motion;Scar mobilization;Splinting;Patient/family education    Plan assess progress with HEP    OT Home Exercise Plan see pt instructions    Consulted and Agree with Plan of Care Patient           Patient will benefit from skilled therapeutic intervention in order to improve the following deficits and impairments:   Body Structure / Function / Physical Skills: ADL, Coordination, Decreased knowledge of precautions, Flexibility, ROM, UE functional use, Scar mobility, FMC, IADL, Pain, Strength, Wound, Skin integrity, Sensation       Visit Diagnosis: Scar condition and fibrosis of skin  Stiffness of right hand, not elsewhere classified  Localized edema  Muscle weakness (generalized)    Problem List Patient Active Problem List   Diagnosis Date Noted  . Chest pain 08/06/2017  . Persistent atrial fibrillation ([place]) 03/18/2016  . A-fib ([place]) 03/18/2016  . Bilateral leg edema 09/19/2015  . Bradycardia 03/23/2014  . Paroxysmal atrial fibrillation ([place]) 01/27/2014  . Obstructive sleep apnea 10/13/2013  . Cardiomyopathy ([place])  07/22/2013  . Gastro-esophageal reflux disease without esophagitis 07/22/2013  . Mixed hyperlipidemia 07/22/2013    Rosalyn Gess OTR/l,CLT 01/23/2020, 3:05 PM  [place] [facility] [address], [place], [address] Phone: [phone]   Fax:  [phone]  Name: Francisco Reed MRN: [id number] Date of Birth: [birth year]

## 2020-01-27 ENCOUNTER — Other Ambulatory Visit: Payer: Self-pay

## 2020-01-27 ENCOUNTER — Ambulatory Visit: Payer: BC Managed Care – PPO | Admitting: Occupational Therapy

## 2020-01-27 DIAGNOSIS — M25641 Stiffness of right hand, not elsewhere classified: Secondary | ICD-10-CM

## 2020-01-27 DIAGNOSIS — R600 Localized edema: Secondary | ICD-10-CM

## 2020-01-27 DIAGNOSIS — L905 Scar conditions and fibrosis of skin: Secondary | ICD-10-CM | POA: Diagnosis not present

## 2020-01-27 DIAGNOSIS — M6281 Muscle weakness (generalized): Secondary | ICD-10-CM

## 2020-01-27 NOTE — Therapy (Signed)
[place] [facility] [address] [place], [address] Phone: [phone]   Fax:  [phone]  Occupational Therapy Treatment  Patient Details  Name: Francisco Reed MRN: [id number] Date of Birth: 1960-03-09 Referring Provider (OT): Dr Rudene Christians   Encounter Date: 01/27/2020   OT End of Session - 01/27/20 0937    Visit Number 3    Number of Visits 14    Date for OT Re-Evaluation 03/09/20    OT Start Time 0832    OT Stop Time 0914    OT Time Calculation (min) 42 min    Activity Tolerance Patient tolerated treatment well    Behavior During Therapy [facility] for tasks assessed/performed           Past Medical History:  Diagnosis Date  . Chronic systolic dysfunction of left ventricle   . Obstructive sleep apnea    mild  . Persistent atrial fibrillation [facility])     Past Surgical History:  Procedure Laterality Date  . ATRIAL FIBRILLATION ABLATION N/A 05/29/2017   Procedure: ATRIAL FIBRILLATION ABLATION;  Surgeon: [name], MD;  Location: [place] CV LAB;  Service: Cardiovascular;  Laterality: N/A;  . CARDIAC CATHETERIZATION    . CARDIOVERSION    . CARDIOVERSION N/A 12/24/2016   Procedure: CARDIOVERSION;  Surgeon: Josue Hector, MD;  Location: [facility] ENDOSCOPY;  Service: Cardiovascular;  Laterality: N/A;  . CARDIOVERSION N/A 03/20/2017   Procedure: CARDIOVERSION;  Surgeon: Fay Records, MD;  Location: [place];  Service: Cardiovascular;  Laterality: N/A;  . COLONOSCOPY WITH PROPOFOL N/A 12/10/2018   Procedure: COLONOSCOPY WITH PROPOFOL;  Surgeon: Jonathon Bellows, MD;  Location: [facility] ENDOSCOPY;  Service: Gastroenterology;  Laterality: N/A;  . ELECTROPHYSIOLOGIC STUDY N/A 12/28/2014   Procedure: CARDIOVERSION;  Surgeon: Corey Skains, MD;  Location: [facility] ORS;  Service: Cardiovascular;  Laterality: N/A;  . ELECTROPHYSIOLOGIC STUDY N/A 09/07/2015   Procedure: CARDIOVERSION;  Surgeon: Corey Skains, MD;  Location: [facility]  ORS;  Service: Cardiovascular;  Laterality: N/A;  . ELECTROPHYSIOLOGIC STUDY N/A 03/18/2016   Procedure: Atrial Fibrillation Ablation;  Surgeon: [name], MD;  Location: [place] CV LAB;  Service: Cardiovascular;  Laterality: N/A;  . RIGHT/LEFT HEART CATH AND CORONARY ANGIOGRAPHY N/A 08/06/2017   Procedure: RIGHT/LEFT HEART CATH AND CORONARY ANGIOGRAPHY;  Surgeon: Martinique, Peter M, MD;  Location: [place] CV LAB;  Service: Cardiovascular;  Laterality: N/A;  . TEE WITHOUT CARDIOVERSION N/A 09/07/2015   Procedure: TRANSESOPHAGEAL ECHOCARDIOGRAM (TEE);  Surgeon: Corey Skains, MD;  Location: [facility] ORS;  Service: Cardiovascular;  Laterality: N/A;  . TEE WITHOUT CARDIOVERSION N/A 03/17/2016   Procedure: TRANSESOPHAGEAL ECHOCARDIOGRAM (TEE);  Surgeon: Lelon Perla, MD;  Location: [facility] ENDOSCOPY;  Service: Cardiovascular;  Laterality: N/A;  . TEE WITHOUT CARDIOVERSION N/A 12/24/2016   Procedure: TRANSESOPHAGEAL ECHOCARDIOGRAM (TEE);  Surgeon: Josue Hector, MD;  Location: [facility] ENDOSCOPY;  Service: Cardiovascular;  Laterality: N/A;  . TEE WITHOUT CARDIOVERSION N/A 05/29/2017   Procedure: TRANSESOPHAGEAL ECHOCARDIOGRAM (TEE);  Surgeon: Lelon Perla, MD;  Location: [facility] ENDOSCOPY;  Service: Cardiovascular;  Laterality: N/A;    There were no vitals filed for this visit.   Subjective Assessment - 01/27/20 0851    Subjective  That tinfoil come off - so I don't know if that is good or bad - I try and move my thumb during day - the tip - I have few stitches - can you take them out    Pertinent History right thumb deep laceration with  open fracture from a table saw injury. healed on own - had antibiotics, and then COVID few weeks ago -had treatment for it The date of injury was 12/16/2019. Seen Dr Rudene Christians 01/18/2020 - stop soaking , can keep open - refer to OT    Patient Stated Goals I am R handed- want my motion and use of R thumb back - I am contractor , play golf, hunt and fish    Currently in Pain?  Yes    Pain Score 2     Pain Location Finger (Comment which one)    Pain Orientation Right    Pain Descriptors / Indicators Tightness;Tender;Aching    Pain Type Acute pain    Pain Onset More than a month ago              [facility] OT Assessment - 01/27/20 0001      Right Hand AROM   R Thumb MCP 0-60 30 Degrees   35 in session   R Thumb IP 0-80 20 Degrees   25 in session   R Thumb Radial ABduction/ADduction 0-55 50    R Thumb Palmar ABduction/ADduction 0-45 70    R Thumb Opposition to Index --   opposition to side of 4th - less pain          AROM for thumb IP and MC taken - see flowsheet- show increase IP flexion/extention this date  [facility] about same - pt can do AAROM for MC flexion          OT Treatments/Exercises (OP) - 01/27/20 0001      RUE Contrast Bath   Time 8 minutes    Comments decrease edema and stiffness            reinforce with pt importance of doing  2- 3 x day  Contrast - but use hot and cold packs   Thumb PA and RA AROM 10 reps - bring IP of thumb in during RA Blocked AROM for IP flexion and extenion - pt wiggle - reinforce AROM until feel pull but less than 2/10   AAROM for MC of thumb flexion , AROM extention 10 reps Opposition - pick up 2 cm foam block - alternate digits- 5-8 reps  Pt able to touch side of 4th this date  Circular AROM to thumb - both ways  10 reps Keep pain under 2/10   Attempt some removal of eschar on proximal thumb - area at volar stitch - healed underneath - ut reinforce for pt to be patient and let it heal underneath -stitch left in place - and deep scab with stitch on lateral side too    Digit stockinet on thumb -and coban wrap only on thumb IP this date to allow more IP AROM f   Coban to decrease edema and cushion thumb to use in light activities - like picking up objects - less than 1 lbs -and pain should be less than 2/10  Precautions review -for coban wrap -  And NO picking up heavy objects more than 1lbs ,  pinch , squeeze or hold tight with thumb pressure       OT Education - 01/27/20 0937    Education Details progress and HEP    Person(s) Educated Patient    Methods Explanation;Demonstration;Tactile cues;Verbal cues;Handout    Comprehension Verbal cues required;Returned demonstration;Verbalized understanding            OT Short Term Goals - 01/20/20 1312      OT  SHORT TERM GOAL #1   Title Pt be independent in HEP to increase AROM , functional use and decrease scar tissue and hyper sensitivity    Baseline Eschar covering most of IP of R thumb - with Vicryl for the nail bed repair,at ED - IP flexion 10, MC 25- not using thumb    Time 4    Period Weeks    Status New    Target Date 02/17/20             OT Long Term Goals - 01/20/20 1320      OT LONG TERM GOAL #1   Title R thumb AROM increase for pt to do opposition to 5th to retrieve objects of 2 cm out of palm    Baseline decrease IP and MC flexion- 10 and 25 degrees    Time 4    Period Weeks    Status New    Target Date 02/10/20      OT LONG TERM GOAL #2   Title R prehension grip increase to more than 75% compare to L to hold tools, golf club, fishing pole    Baseline NT - 5 wks out from injury - eschar and  Vicryl for the nail bed repair,in place    Time 7    Period Weeks    Status New    Target Date 03/09/20      OT LONG TERM GOAL #3   Title Functional use of R hand improve and using thumb with more than 25 points    Baseline pt score for function on PRWHE 33/50    Time 7    Period Weeks    Status New    Target Date 03/09/20                 Plan - 01/27/20 9678    Clinical Impression Statement Pt is 6 wks out from saw accident to R thumb - tuft fracture - pt report no drainage this past week but tinfoil on nailbed come off last night - appear dry and no issues- but did ask pt to contact MD - pt show increase AROM of IP flexion , extention - done some removal of escar on proximal volar thumb - healing  but not ready to come off yet - kept stitch in place on volar thumb - and lateral thumb- reinforce for pt no pressure on thumb but can use it for light activities    OT Occupational Profile and History Problem Focused Assessment - Including review of records relating to presenting problem    Occupational performance deficits (Please refer to evaluation for details): ADL's;IADL's;Work;Play;Leisure;Social Participation    Body Structure / Function / Physical Skills ADL;Coordination;Decreased knowledge of precautions;Flexibility;ROM;UE functional use;Scar mobility;FMC;IADL;Pain;Strength;Wound;Skin integrity;Sensation    Rehab Potential Good    Clinical Decision Making Limited treatment options, no task modification necessary    Comorbidities Affecting Occupational Performance: None    Modification or Assistance to Complete Evaluation  No modification of tasks or assist necessary to complete eval    OT Frequency 2x / week    OT Duration 6 weeks    OT Treatment/Interventions Self-care/ADL training;Paraffin;Fluidtherapy;Contrast Bath;Therapeutic exercise;Manual Therapy;Passive range of motion;Scar mobilization;Splinting;Patient/family education    Plan assess progress with HEP    OT Home Exercise Plan see pt instructions    Consulted and Agree with Plan of Care Patient           Patient will benefit from skilled therapeutic intervention in order to improve the  following deficits and impairments:   Body Structure / Function / Physical Skills: ADL,Coordination,Decreased knowledge of precautions,Flexibility,ROM,UE functional use,Scar mobility,FMC,IADL,Pain,Strength,Wound,Skin integrity,Sensation       Visit Diagnosis: Scar condition and fibrosis of skin  Stiffness of right hand, not elsewhere classified  Localized edema  Muscle weakness (generalized)    Problem List Patient Active Problem List   Diagnosis Date Noted  . Chest pain 08/06/2017  . Persistent atrial fibrillation ([place])  03/18/2016  . A-fib ([place]) 03/18/2016  . Bilateral leg edema 09/19/2015  . Bradycardia 03/23/2014  . Paroxysmal atrial fibrillation ([place]) 01/27/2014  . Obstructive sleep apnea 10/13/2013  . Cardiomyopathy ([place]) 07/22/2013  . Gastro-esophageal reflux disease without esophagitis 07/22/2013  . Mixed hyperlipidemia 07/22/2013    Rosalyn Gess OTR/L,CLT 01/27/2020, 9:47 AM  [place] [facility] [address] [place], [address] Phone: [phone]   Fax:  [phone]  Name: Francisco Reed MRN: [id number] Date of Birth: October 07, 1960

## 2020-01-31 ENCOUNTER — Ambulatory Visit: Payer: BC Managed Care – PPO | Admitting: Occupational Therapy

## 2020-01-31 ENCOUNTER — Other Ambulatory Visit: Payer: Self-pay

## 2020-01-31 DIAGNOSIS — M25641 Stiffness of right hand, not elsewhere classified: Secondary | ICD-10-CM

## 2020-01-31 DIAGNOSIS — L905 Scar conditions and fibrosis of skin: Secondary | ICD-10-CM | POA: Diagnosis not present

## 2020-01-31 DIAGNOSIS — R600 Localized edema: Secondary | ICD-10-CM

## 2020-01-31 DIAGNOSIS — M6281 Muscle weakness (generalized): Secondary | ICD-10-CM

## 2020-01-31 NOTE — Therapy (Signed)
[place] [facility] [address] [place], [address] Phone: [phone]   Fax:  [phone]  Occupational Therapy Treatment  Patient Details  Name: Francisco Reed MRN: [id number] Date of Birth: 1960/11/03 Referring Provider (OT): Dr Rudene Christians   Encounter Date: 01/31/2020   OT End of Session - 01/31/20 0928    Visit Number 4    Number of Visits 14    Date for OT Re-Evaluation 03/09/20    OT Start Time 0844    OT Stop Time 0920    OT Time Calculation (min) 36 min    Activity Tolerance Patient tolerated treatment well    Behavior During Therapy [facility] for tasks assessed/performed           Past Medical History:  Diagnosis Date  . Chronic systolic dysfunction of left ventricle   . Obstructive sleep apnea    mild  . Persistent atrial fibrillation [facility])     Past Surgical History:  Procedure Laterality Date  . ATRIAL FIBRILLATION ABLATION N/A 05/29/2017   Procedure: ATRIAL FIBRILLATION ABLATION;  Surgeon: [name], MD;  Location: [place] [facility];  Service: Cardiovascular;  Laterality: N/A;  . CARDIAC CATHETERIZATION    . CARDIOVERSION    . CARDIOVERSION N/A 12/24/2016   Procedure: CARDIOVERSION;  Surgeon: Josue Hector, MD;  Location: [facility] ENDOSCOPY;  Service: Cardiovascular;  Laterality: N/A;  . CARDIOVERSION N/A 03/20/2017   Procedure: CARDIOVERSION;  Surgeon: Fay Records, MD;  Location: [place];  Service: Cardiovascular;  Laterality: N/A;  . COLONOSCOPY WITH PROPOFOL N/A 12/10/2018   Procedure: COLONOSCOPY WITH PROPOFOL;  Surgeon: Jonathon Bellows, MD;  Location: [facility] ENDOSCOPY;  Service: Gastroenterology;  Laterality: N/A;  . ELECTROPHYSIOLOGIC STUDY N/A 12/28/2014   Procedure: CARDIOVERSION;  Surgeon: Corey Skains, MD;  Location: [facility] ORS;  Service: Cardiovascular;  Laterality: N/A;  . ELECTROPHYSIOLOGIC STUDY N/A 09/07/2015   Procedure: CARDIOVERSION;  Surgeon: Corey Skains, MD;  Location: [facility]  ORS;  Service: Cardiovascular;  Laterality: N/A;  . ELECTROPHYSIOLOGIC STUDY N/A 03/18/2016   Procedure: Atrial Fibrillation Ablation;  Surgeon: [name], MD;  Location: [place] [facility] LAB;  Service: Cardiovascular;  Laterality: N/A;  . RIGHT/LEFT HEART CATH AND CORONARY ANGIOGRAPHY N/A 08/06/2017   Procedure: RIGHT/LEFT HEART CATH AND CORONARY ANGIOGRAPHY;  Surgeon: Martinique, Peter M, MD;  Location: [place] CV LAB;  Service: Cardiovascular;  Laterality: N/A;  . TEE WITHOUT CARDIOVERSION N/A 09/07/2015   Procedure: TRANSESOPHAGEAL ECHOCARDIOGRAM (TEE);  Surgeon: Corey Skains, MD;  Location: [facility] ORS;  Service: Cardiovascular;  Laterality: N/A;  . TEE WITHOUT CARDIOVERSION N/A 03/17/2016   Procedure: TRANSESOPHAGEAL ECHOCARDIOGRAM (TEE);  Surgeon: Lelon Perla, MD;  Location: [facility] ENDOSCOPY;  Service: Cardiovascular;  Laterality: N/A;  . TEE WITHOUT CARDIOVERSION N/A 12/24/2016   Procedure: TRANSESOPHAGEAL ECHOCARDIOGRAM (TEE);  Surgeon: Josue Hector, MD;  Location: [facility] ENDOSCOPY;  Service: Cardiovascular;  Laterality: N/A;  . TEE WITHOUT CARDIOVERSION N/A 05/29/2017   Procedure: TRANSESOPHAGEAL ECHOCARDIOGRAM (TEE);  Surgeon: Lelon Perla, MD;  Location: [facility] ENDOSCOPY;  Service: Cardiovascular;  Laterality: N/A;    There were no vitals filed for this visit.   Subjective Assessment - 01/31/20 0846    Subjective  Some of this scab come off on the bottom - that stitch too and here on the side -but above it still oozing - my thumb is just so stiff- why is it stiff    Pertinent History right thumb deep laceration with open fracture from a  table saw injury. healed on own - had antibiotics, and then COVID few weeks ago -had treatment for it The date of injury was 12/16/2019. Seen Dr Rudene Christians 01/18/2020 - stop soaking , can keep open - refer to OT    Patient Stated Goals I am R handed- want my motion and use of R thumb back - I am contractor , play golf, hunt and fish    Currently in Pain?  No/denies              [facility] OT Assessment - 01/31/20 0001      Right Hand AROM   R Thumb MCP 0-60 30 Degrees   35 in session   R Thumb IP 0-80 20 Degrees   in session 35 to 40   R Thumb Opposition to Index --   opposition to pad <.pad of 4th , side of 5th           Pt arrive with same AROM in thumb than the last time or 2 - reinforce with pt to do HEP for ROM - more than 2 x day - and use heat prior to am and pm HEP But during day - at least 3 more times doing PROM and AROM - to maintain his progress Eschar improving - coming off proximal to distal - reinforce for pt to leave eschar alone if stuck - needs to heal underneath - if it comes of during shower , or light drying or rubbing can take off         OT Treatments/Exercises (OP) - 01/31/20 0001      Moist Heat Therapy   Number Minutes Moist Heat 8 Minutes    Moist Heat Location --   prior to review of HEP for PROM          PROM for MC and IP flexion of thumb  PROM composite thumb flexion this date - All light and pull less than 2/10  10 reps   5 x day to do  Blocked AROM to thumb IP , MC 10 reps  5 x day   Followed by opposition to all digits - focus on pad<> pad - and side of 5th - goal for next visit And sliding down 3rd and 4th digit with thumb 10 reps   Digit stockinet on thumb -and coban wrap only on thumb IP this date to allow more IP AROM with light activities  [place]anto decrease edema and cushion thumb to use in light activities - like picking up objects - less than 1 lbs -and pain should be less than 2/10  Precautions review -for coban wrap -  AndNOpicking up heavy objects more than 1lbs , pinch , squeeze or hold tight with thumb pressure         OT Education - 01/31/20 0928    Education Details progress and  changes to HEP    Person(s) Educated Patient    Methods Explanation;Demonstration;Tactile cues;Verbal cues;Handout    Comprehension Verbal cues required;Returned  demonstration;Verbalized understanding            OT Short Term Goals - 01/20/20 1312      OT SHORT TERM GOAL #1   Title Pt be independent in HEP to increase AROM , functional use and decrease scar tissue and hyper sensitivity    Baseline Eschar covering most of IP of R thumb - with Vicryl for the nail bed repair,at ED - IP flexion 10, MC 25- not using thumb    Time  4    Period Weeks    Status New    Target Date 02/17/20             OT Long Term Goals - 01/20/20 1320      OT LONG TERM GOAL #1   Title R thumb AROM increase for pt to do opposition to 5th to retrieve objects of 2 cm out of palm    Baseline decrease IP and MC flexion- 10 and 25 degrees    Time 4    Period Weeks    Status New    Target Date 02/10/20      OT LONG TERM GOAL #2   Title R prehension grip increase to more than 75% compare to L to hold tools, golf club, fishing pole    Baseline NT - 5 wks out from injury - eschar and  Vicryl for the nail bed repair,in place    Time 7    Period Weeks    Status New    Target Date 03/09/20      OT LONG TERM GOAL #3   Title Functional use of R hand improve and using thumb with more than 25 points    Baseline pt score for function on PRWHE 33/50    Time 7    Period Weeks    Status New    Target Date 03/09/20                 Plan - 01/31/20 0929    Clinical Impression Statement Pt is 6 1/2 wks out from saw accident to R thumb - tuft fracture - pt show increase healing and eschar coming off proximal to distal - explain again for pt healing , and what he can remove and what not - as well as importance of doing ROM HEP during day - several times - at least 5 x day to be able to maintain his progress and use it it light activities - still to keep coban on distal part to be able to use in light activities but provided padding for healing under scab - was able to touch 5th digit with opposition    OT Occupational Profile and History Problem Focused Assessment -  Including review of records relating to presenting problem    Occupational performance deficits (Please refer to evaluation for details): ADL's;IADL's;Work;Play;Leisure;Social Participation    Body Structure / Function / Physical Skills ADL;Coordination;Decreased knowledge of precautions;Flexibility;ROM;UE functional use;Scar mobility;FMC;IADL;Pain;Strength;Wound;Skin integrity;Sensation    Rehab Potential Good    Clinical Decision Making Limited treatment options, no task modification necessary    Comorbidities Affecting Occupational Performance: None    Modification or Assistance to Complete Evaluation  No modification of tasks or assist necessary to complete eval    OT Frequency 2x / week    OT Duration 6 weeks    OT Treatment/Interventions Self-care/ADL training;Paraffin;Fluidtherapy;Contrast Bath;Therapeutic exercise;Manual Therapy;Passive range of motion;Scar mobilization;Splinting;Patient/family education    Plan assess progress with HEP    OT Home Exercise Plan see pt instructions    Consulted and Agree with Plan of Care Patient           Patient will benefit from skilled therapeutic intervention in order to improve the following deficits and impairments:   Body Structure / Function / Physical Skills: ADL,Coordination,Decreased knowledge of precautions,Flexibility,ROM,UE functional use,Scar mobility,FMC,IADL,Pain,Strength,Wound,Skin integrity,Sensation       Visit Diagnosis: Scar condition and fibrosis of skin  Stiffness of right hand, not elsewhere classified  Localized edema  Muscle weakness (generalized)  Problem List Patient Active Problem List   Diagnosis Date Noted  . Chest pain 08/06/2017  . Persistent atrial fibrillation ([place]) 03/18/2016  . A-fib ([place]) 03/18/2016  . Bilateral leg edema 09/19/2015  . Bradycardia 03/23/2014  . Paroxysmal atrial fibrillation ([place]) 01/27/2014  . Obstructive sleep apnea 10/13/2013  . Cardiomyopathy ([place]) 07/22/2013  .  Gastro-esophageal reflux disease without esophagitis 07/22/2013  . Mixed hyperlipidemia 07/22/2013    Rosalyn Gess OTR/l,CLT 01/31/2020, 9:32 AM  [place] [facility] [address] [place], [address] Phone: [phone]   Fax:  [phone]  Name: Francisco Reed MRN: [id number] Date of Birth: [birth year]

## 2020-02-03 ENCOUNTER — Ambulatory Visit: Payer: BC Managed Care – PPO | Admitting: Occupational Therapy

## 2020-02-06 ENCOUNTER — Other Ambulatory Visit: Payer: Self-pay

## 2020-02-06 ENCOUNTER — Ambulatory Visit: Payer: BC Managed Care – PPO | Admitting: Occupational Therapy

## 2020-02-06 DIAGNOSIS — M6281 Muscle weakness (generalized): Secondary | ICD-10-CM

## 2020-02-06 DIAGNOSIS — M25641 Stiffness of right hand, not elsewhere classified: Secondary | ICD-10-CM

## 2020-02-06 DIAGNOSIS — L905 Scar conditions and fibrosis of skin: Secondary | ICD-10-CM

## 2020-02-06 DIAGNOSIS — R600 Localized edema: Secondary | ICD-10-CM

## 2020-02-06 NOTE — Therapy (Signed)
[place] [facility] [address] [place], [address] Phone: [phone]   Fax:  [phone]  Occupational Therapy Treatment  Patient Details  Name: Francisco Reed MRN: [id number] Date of Birth: Mar 05, 1960 Referring Provider (OT): Dr Rudene Christians   Encounter Date: 02/06/2020   OT End of Session - 02/06/20 0759    Visit Number 5    Number of Visits 14    Date for OT Re-Evaluation 03/09/20    OT Start Time 0835    OT Stop Time 0855    OT Time Calculation (min) 20 min    Activity Tolerance Patient tolerated treatment well    Behavior During Therapy [facility] for tasks assessed/performed           Past Medical History:  Diagnosis Date  . Chronic systolic dysfunction of left ventricle   . Obstructive sleep apnea    mild  . Persistent atrial fibrillation [facility])     Past Surgical History:  Procedure Laterality Date  . ATRIAL FIBRILLATION ABLATION N/A 05/29/2017   Procedure: ATRIAL FIBRILLATION ABLATION;  Surgeon: [name], MD;  Location: [place] CV LAB;  Service: Cardiovascular;  Laterality: N/A;  . CARDIAC CATHETERIZATION    . CARDIOVERSION    . CARDIOVERSION N/A 12/24/2016   Procedure: CARDIOVERSION;  Surgeon: Josue Hector, MD;  Location: [facility] ENDOSCOPY;  Service: Cardiovascular;  Laterality: N/A;  . CARDIOVERSION N/A 03/20/2017   Procedure: CARDIOVERSION;  Surgeon: Fay Records, MD;  Location: [place];  Service: Cardiovascular;  Laterality: N/A;  . COLONOSCOPY WITH PROPOFOL N/A 12/10/2018   Procedure: COLONOSCOPY WITH PROPOFOL;  Surgeon: Jonathon Bellows, MD;  Location: [facility] ENDOSCOPY;  Service: Gastroenterology;  Laterality: N/A;  . ELECTROPHYSIOLOGIC STUDY N/A 12/28/2014   Procedure: CARDIOVERSION;  Surgeon: Corey Skains, MD;  Location: [facility] ORS;  Service: Cardiovascular;  Laterality: N/A;  . ELECTROPHYSIOLOGIC STUDY N/A 09/07/2015   Procedure: CARDIOVERSION;  Surgeon: Corey Skains, MD;  Location: [facility]  ORS;  Service: Cardiovascular;  Laterality: N/A;  . ELECTROPHYSIOLOGIC STUDY N/A 03/18/2016   Procedure: Atrial Fibrillation Ablation;  Surgeon: [name], MD;  Location: [place] CV LAB;  Service: Cardiovascular;  Laterality: N/A;  . RIGHT/LEFT HEART CATH AND CORONARY ANGIOGRAPHY N/A 08/06/2017   Procedure: RIGHT/LEFT HEART CATH AND CORONARY ANGIOGRAPHY;  Surgeon: Martinique, Peter M, MD;  Location: [place] CV LAB;  Service: Cardiovascular;  Laterality: N/A;  . TEE WITHOUT CARDIOVERSION N/A 09/07/2015   Procedure: TRANSESOPHAGEAL ECHOCARDIOGRAM (TEE);  Surgeon: Corey Skains, MD;  Location: [facility] ORS;  Service: Cardiovascular;  Laterality: N/A;  . TEE WITHOUT CARDIOVERSION N/A 03/17/2016   Procedure: TRANSESOPHAGEAL ECHOCARDIOGRAM (TEE);  Surgeon: Lelon Perla, MD;  Location: [facility] ENDOSCOPY;  Service: Cardiovascular;  Laterality: N/A;  . TEE WITHOUT CARDIOVERSION N/A 12/24/2016   Procedure: TRANSESOPHAGEAL ECHOCARDIOGRAM (TEE);  Surgeon: Josue Hector, MD;  Location: [facility] ENDOSCOPY;  Service: Cardiovascular;  Laterality: N/A;  . TEE WITHOUT CARDIOVERSION N/A 05/29/2017   Procedure: TRANSESOPHAGEAL ECHOCARDIOGRAM (TEE);  Surgeon: Lelon Perla, MD;  Location: [facility] ENDOSCOPY;  Service: Cardiovascular;  Laterality: N/A;    There were no vitals filed for this visit.   Subjective Assessment - 02/06/20 0758    Subjective  I told my wife you got 40 degrees- I need to work it - some more scab and stitch come off - hurts when I bend it    Pertinent History right thumb deep laceration with open fracture from a table saw injury. healed on own -  had antibiotics, and then COVID few weeks ago -had treatment for it The date of injury was 12/16/2019. Seen Dr Rudene Christians 01/18/2020 - stop soaking , can keep open - refer to OT    Patient Stated Goals I am R handed- want my motion and use of R thumb back - I am contractor , play golf, hunt and fish    Currently in Pain? No/denies              [facility] OT  Assessment - 02/06/20 0001      Right Hand AROM   R Thumb MCP 0-60 35 Degrees    R Thumb IP 0-80 35 Degrees   45 in session   R Thumb Opposition to Index --   opposition to pad on pad wiht 4th - side of 5th          Pt show increase IP and MC flexion of thumb - but report pain was 4-6/10 at home doing PROM - reinforce keeping at 2-3/10 and use heat prior to PROM  Can do AROM during day without heat          OT Treatments/Exercises (OP) - 02/06/20 0001      Moist Heat Therapy   Number Minutes Moist Heat 5 Minutes    Moist Heat Location Hand   prior to PROM and AROM          PROM for MC and IP flexion of thumb  PROM composite thumb flexion this date - All light and pull less than 2/10  10 reps   5 x day to do  Blocked AROM to thumb IP , MC 10 reps  5 x day   Followed by opposition to all digits - focus on pad<> pad - and side of 5th - goal for next visit And sliding down 3rd and 4th digit with thumb 10 reps   Digit stockinet on thumb -and coban wraponly on thumb IP this date to allow more IP AROMwith light activities Ed pt on starting light desentPROM for [facility] and IP flexion of thumb  PROM composite thumb flexion this date - All light and pull less than 2/10  10 reps   5 x day to do  Blocked AROM to thumb IP , MC 10 reps  5 x day   Followed by opposition to all digits - focus on pad<> pad - and side of 5th - goal for next visit And sliding down 3rd and 4th digit with thumb 10 reps   Digit stockinet on thumb -and coban wraponly on thumb IP this date to allow more IP AROMwith light activities  Cobanto decrease edema and cushion thumb to use in light activities - like picking up objects - less than 1 lbs -and pain should be less than 2/10  Precautions review -for coban wrap -  AndNOpicking up heavy objects more than 1lbs , pinch , squeeze or hold tight with thumb pressure Desentitzation end of week -can start massage, soft and rougher texture -  gentle    Cobanto decrease edema and cushion thumb to use in light activities - like picking up objects - less than 1 lbs -and pain should be less than 2/10  Precautions review -for coban wrap -  AndNOpicking up heavy objects more than 1lbs , pinch , squeeze or hold tight with thumb pressure         OT Education - 02/06/20 0759    Education Details progress and  changes to HEP  Person(s) Educated Patient    Methods Explanation;Demonstration;Tactile cues;Verbal cues;Handout    Comprehension Verbal cues required;Returned demonstration;Verbalized understanding            OT Short Term Goals - 01/20/20 1312      OT SHORT TERM GOAL #1   Title Pt be independent in HEP to increase AROM , functional use and decrease scar tissue and hyper sensitivity    Baseline Eschar covering most of IP of R thumb - with Vicryl for the nail bed repair,at ED - IP flexion 10, MC 25- not using thumb    Time 4    Period Weeks    Status New    Target Date 02/17/20             OT Long Term Goals - 01/20/20 1320      OT LONG TERM GOAL #1   Title R thumb AROM increase for pt to do opposition to 5th to retrieve objects of 2 cm out of palm    Baseline decrease IP and MC flexion- 10 and 25 degrees    Time 4    Period Weeks    Status New    Target Date 02/10/20      OT LONG TERM GOAL #2   Title R prehension grip increase to more than 75% compare to L to hold tools, golf club, fishing pole    Baseline NT - 5 wks out from injury - eschar and  Vicryl for the nail bed repair,in place    Time 7    Period Weeks    Status New    Target Date 03/09/20      OT LONG TERM GOAL #3   Title Functional use of R hand improve and using thumb with more than 25 points    Baseline pt score for function on PRWHE 33/50    Time 7    Period Weeks    Status New    Target Date 03/09/20                 Plan - 02/06/20 0800    Clinical Impression Statement PT is 7 wks out from saw accident to R  thumb - tugt fracture- pt show increase healing of eschar and increase AROM of thumb MC and IP- pt to keep pain under 2-3/10 with PROM and use heat - can start slowly some desentitization on new skin end of week - will work on scar massage week of 28th - waiting for new skin to mature more - remind pt to not pull of eschar to quick and still keep thumb cushioned during day at work    OT Occupational Profile and History Problem Focused Assessment - Including review of records relating to presenting problem    Occupational performance deficits (Please refer to evaluation for details): ADL's;IADL's;Work;Play;Leisure;Social Participation    Body Structure / Function / Physical Skills ADL;Coordination;Decreased knowledge of precautions;Flexibility;ROM;UE functional use;Scar mobility;FMC;IADL;Pain;Strength;Wound;Skin integrity;Sensation    Rehab Potential Good    Clinical Decision Making Limited treatment options, no task modification necessary    Comorbidities Affecting Occupational Performance: None    Modification or Assistance to Complete Evaluation  No modification of tasks or assist necessary to complete eval    OT Frequency 2x / week    OT Duration 4 weeks    OT Treatment/Interventions Self-care/ADL training;Paraffin;Fluidtherapy;Contrast Bath;Therapeutic exercise;Manual Therapy;Passive range of motion;Scar mobilization;Splinting;Patient/family education    Plan assess progress with HEP    OT Home Exercise Plan see pt instructions  Consulted and Agree with Plan of Care Patient           Patient will benefit from skilled therapeutic intervention in order to improve the following deficits and impairments:   Body Structure / Function / Physical Skills: ADL,Coordination,Decreased knowledge of precautions,Flexibility,ROM,UE functional use,Scar mobility,FMC,IADL,Pain,Strength,Wound,Skin integrity,Sensation       Visit Diagnosis: Scar condition and fibrosis of skin  Stiffness of right hand,  not elsewhere classified  Localized edema  Muscle weakness (generalized)    Problem List Patient Active Problem List   Diagnosis Date Noted  . Chest pain 08/06/2017  . Persistent atrial fibrillation ([place]) 03/18/2016  . A-fib ([place]) 03/18/2016  . Bilateral leg edema 09/19/2015  . Bradycardia 03/23/2014  . Paroxysmal atrial fibrillation ([place]) 01/27/2014  . Obstructive sleep apnea 10/13/2013  . Cardiomyopathy ([place]) 07/22/2013  . Gastro-esophageal reflux disease without esophagitis 07/22/2013  . Mixed hyperlipidemia 07/22/2013    Rosalyn Gess OTR/l,CLT 02/06/2020, 8:04 AM  [place] [facility] [address], [place], [address] Phone: [phone]   Fax:  [phone]  Name: Francisco Reed MRN: [id number] Date of Birth: 06/13/1960

## 2020-02-08 ENCOUNTER — Other Ambulatory Visit: Payer: Self-pay | Admitting: Internal Medicine

## 2020-02-14 ENCOUNTER — Ambulatory Visit: Payer: BC Managed Care – PPO | Admitting: Occupational Therapy

## 2020-02-20 ENCOUNTER — Telehealth: Payer: Self-pay | Admitting: *Deleted

## 2020-02-20 ENCOUNTER — Ambulatory Visit: Payer: BC Managed Care – PPO | Admitting: Internal Medicine

## 2020-02-20 MED ORDER — FLECAINIDE ACETATE 50 MG PO TABS: 50.0000 mg | ORAL_TABLET | Freq: Two times a day (BID) | ORAL | 0 refills | Status: DC

## 2020-02-20 NOTE — Telephone Encounter (Signed)
Notified the patient that Dr. Johney Frame will not be able to make it into the office in time for their appointment this morning related to weather and downed trees.   Sent [place] a message to help reschedule.   Patient verbalized understanding

## 2020-03-12 ENCOUNTER — Ambulatory Visit: Payer: BC Managed Care – PPO | Admitting: Internal Medicine

## 2020-03-12 ENCOUNTER — Encounter: Payer: Self-pay | Admitting: Internal Medicine

## 2020-03-12 ENCOUNTER — Other Ambulatory Visit: Payer: Self-pay

## 2020-03-12 VITALS — BP 122/84 | HR 80 | Ht 70.0 in | Wt 215.6 lb

## 2020-03-12 DIAGNOSIS — D6869 Other thrombophilia: Secondary | ICD-10-CM

## 2020-03-12 DIAGNOSIS — I4819 Other persistent atrial fibrillation: Secondary | ICD-10-CM | POA: Diagnosis not present

## 2020-03-12 DIAGNOSIS — I428 Other cardiomyopathies: Secondary | ICD-10-CM | POA: Diagnosis not present

## 2020-03-12 MED ORDER — FLECAINIDE ACETATE 50 MG PO TABS: 50.0000 mg | ORAL_TABLET | Freq: Two times a day (BID) | ORAL | 3 refills | Status: DC

## 2020-03-12 NOTE — Progress Notes (Signed)
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 male who presents today for routine electrophysiology followup.  Since last being seen in our clinic, the patient reports doing very well.  Today, he denies symptoms of palpitations, chest pain, shortness of breath,  lower extremity edema, dizziness, presyncope, or syncope.  The patient is otherwise without complaint today.   Past Medical History:  Diagnosis Date  . Chronic systolic dysfunction of left ventricle   . Obstructive sleep apnea    mild  . Persistent atrial fibrillation [facility])    Past Surgical History:  Procedure Laterality Date  . ATRIAL FIBRILLATION ABLATION N/A 05/29/2017   Procedure: ATRIAL FIBRILLATION ABLATION;  Surgeon: [name], MD;  Location: [place] CV LAB;  Service: Cardiovascular;  Laterality: N/A;  . CARDIAC CATHETERIZATION    . CARDIOVERSION    . CARDIOVERSION N/A 12/24/2016   Procedure: CARDIOVERSION;  Surgeon: Josue Hector, MD;  Location: [facility] ENDOSCOPY;  Service: Cardiovascular;  Laterality: N/A;  . CARDIOVERSION N/A 03/20/2017   Procedure: CARDIOVERSION;  Surgeon: Fay Records, MD;  Location: [place];  Service: Cardiovascular;  Laterality: N/A;  . COLONOSCOPY WITH PROPOFOL N/A 12/10/2018   Procedure: COLONOSCOPY WITH PROPOFOL;  Surgeon: Jonathon Bellows, MD;  Location: [facility] ENDOSCOPY;  Service: Gastroenterology;  Laterality: N/A;  . ELECTROPHYSIOLOGIC STUDY N/A 12/28/2014   Procedure: CARDIOVERSION;  Surgeon: Corey Skains, MD;  Location: [facility] ORS;  Service: Cardiovascular;  Laterality: N/A;  . ELECTROPHYSIOLOGIC STUDY N/A 09/07/2015   Procedure: CARDIOVERSION;  Surgeon: Corey Skains, MD;  Location: [facility] ORS;  Service: Cardiovascular;  Laterality: N/A;  . ELECTROPHYSIOLOGIC STUDY N/A 03/18/2016   Procedure: Atrial Fibrillation Ablation;  Surgeon: [name], MD;  Location: [place] CV LAB;  Service: Cardiovascular;  Laterality: N/A;  . RIGHT/LEFT HEART CATH  AND CORONARY ANGIOGRAPHY N/A 08/06/2017   Procedure: RIGHT/LEFT HEART CATH AND CORONARY ANGIOGRAPHY;  Surgeon: Martinique, Peter M, MD;  Location: [place] CV LAB;  Service: Cardiovascular;  Laterality: N/A;  . TEE WITHOUT CARDIOVERSION N/A 09/07/2015   Procedure: TRANSESOPHAGEAL ECHOCARDIOGRAM (TEE);  Surgeon: Corey Skains, MD;  Location: [facility] ORS;  Service: Cardiovascular;  Laterality: N/A;  . TEE WITHOUT CARDIOVERSION N/A 03/17/2016   Procedure: TRANSESOPHAGEAL ECHOCARDIOGRAM (TEE);  Surgeon: Lelon Perla, MD;  Location: [facility] ENDOSCOPY;  Service: Cardiovascular;  Laterality: N/A;  . TEE WITHOUT CARDIOVERSION N/A 12/24/2016   Procedure: TRANSESOPHAGEAL ECHOCARDIOGRAM (TEE);  Surgeon: Josue Hector, MD;  Location: [facility] ENDOSCOPY;  Service: Cardiovascular;  Laterality: N/A;  . TEE WITHOUT CARDIOVERSION N/A 05/29/2017   Procedure: TRANSESOPHAGEAL ECHOCARDIOGRAM (TEE);  Surgeon: Lelon Perla, MD;  Location: [facility] ENDOSCOPY;  Service: Cardiovascular;  Laterality: N/A;    ROS- all systems are reviewed and negatives except as per HPI above  Current Outpatient Medications  Medication Sig Dispense Refill  . acetaminophen (TYLENOL) 500 MG tablet Take 1,000 mg by mouth 2 (two) times daily as needed for mild pain or moderate pain.     Marland Kitchen apixaban (ELIQUIS) 5 MG TABS tablet TAKE 1 TABLET (5 MG TOTAL) 2 (TWO) TIMES DAILY BY MOUTH.  Overdue for follow-up and bloodwork. MUST see MD for future refills. 60 tablet 1  . ascorbic acid (VITAMIN C) 1000 MG tablet Take 2 tablets by mouth daily.    . busPIRone (BUSPAR) 10 MG tablet Take 10 mg by mouth 2 (two) times daily.    . finasteride (PROPECIA) 1 MG tablet Take 1 tablet by mouth daily.    Marland Kitchen  flecainide (TAMBOCOR) 50 MG tablet Take 1 tablet (50 mg total) by mouth 2 (two) times daily. Please keep upcoming appt in January 2022 with Dr. Rayann Heman before anymore refills. Thank you 60 tablet 0  . tadalafil (CIALIS) 5 MG tablet Take 1 tablet (5 mg total) by mouth daily as  needed for erectile dysfunction. 10 tablet 3   No current facility-administered medications for this visit.    Physical Exam: Vitals:   03/12/20 0848  BP: 122/84  Pulse: 80  SpO2: 90%  Weight: 215 lb 9.6 oz (97.8 kg)  Height: 5\' 10"  (1.778 m)    GEN- The patient is well appearing, alert and oriented x 3 today.   Head- normocephalic, atraumatic Eyes-  Sclera clear, conjunctiva pink Ears- hearing intact Oropharynx- clear Lungs- Clear to ausculation bilaterally, normal work of breathing Heart- Regular rate and rhythm, no murmurs, rubs or gallops, PMI not laterally displaced GI- soft, NT, ND, + BS Extremities- no clubbing, cyanosis, or edema  Wt Readings from Last 3 Encounters:  03/12/20 215 lb 9.6 oz (97.8 kg)  12/10/18 215 lb (97.5 kg)  03/29/18 225 lb 12.8 oz (102.4 kg)    EKG tracing ordered today is personally reviewed and shows sinus  Assessment and Plan:  1. Persistent afib Doing very well post ablation on flecainide.  We need to follow him closely on flecainide to avoid toxicity Does not wish to make changes chads2vasc score is 1.  He is on eliquis.  We discussed pros and cons of this medicine at length.  He wishes to continue it. He has labs followed by his PCP.  I have asked that he have them forwarded to [place].  2. Aortic root enlargement Repeat echo to evaluate (previously measured 4.3cm)  3. Tachycardia mediated CM Resolved previously with sinus Repeat echo  4. ED Improved off of toprol  Risks, benefits and potential toxicities for medications prescribed and/or refilled reviewed with patient today.   Return in a year  [name] MD, [facility] 03/12/2020 8:53 AM

## 2020-03-12 NOTE — Patient Instructions (Addendum)
Medication Instructions:  Continue current medications  *If you need a refill on your cardiac medications before your next appointment, please call your pharmacy*   Lab Work: none If you have labs (blood work) drawn today and your tests are completely normal, you will receive your results only by: Marland Kitchen MyChart Message (if you have MyChart) OR . A paper copy in the mail If you have any lab test that is abnormal or we need to change your treatment, we will call you to review the results.   Testing/Procedures: please schedule echo Your physician has requested that you have an echocardiogram. Echocardiography is a painless test that uses sound waves to create images of your heart. It provides your doctor with information about the size and shape of your heart and how well your heart's chambers and valves are working. This procedure takes approximately one hour. There are no restrictions for this procedure.     Follow-Up: At [facility], you and your health needs are our priority.  As part of our continuing mission to provide you with exceptional heart care, we have created designated Provider Care Teams.  These Care Teams include your primary Cardiologist (physician) and Advanced Practice Providers (APPs -  Physician Assistants and Nurse Practitioners) who all work together to provide you with the care you need, when you need it.  We recommend signing up for the patient portal called "MyChart".  Sign up information is provided on this After Visit Summary.  MyChart is used to connect with patients for Virtual Visits (Telemedicine).  Patients are able to view lab/test results, encounter notes, upcoming appointments, etc.  Non-urgent messages can be sent to your provider as well.   To learn more about what you can do with MyChart, go to [email or web address].    Your next appointment:   12 month(s)  The format for your next appointment:   In Person  Provider:   Dr. [name]   Other  Instructions

## 2020-03-16 ENCOUNTER — Other Ambulatory Visit: Payer: Self-pay | Admitting: Orthopedic Surgery

## 2020-03-16 DIAGNOSIS — S62521D Displaced fracture of distal phalanx of right thumb, subsequent encounter for fracture with routine healing: Secondary | ICD-10-CM

## 2020-03-19 ENCOUNTER — Ambulatory Visit
Admission: RE | Admit: 2020-03-19 | Discharge: 2020-03-19 | Disposition: A | Discharge status: Home / Self Care | Payer: BC Managed Care – PPO | Source: Ambulatory Visit | Attending: Orthopedic Surgery | Admitting: Orthopedic Surgery

## 2020-03-19 ENCOUNTER — Other Ambulatory Visit: Payer: Self-pay

## 2020-03-19 DIAGNOSIS — S62521D Displaced fracture of distal phalanx of right thumb, subsequent encounter for fracture with routine healing: Secondary | ICD-10-CM

## 2020-03-20 ENCOUNTER — Other Ambulatory Visit: Payer: Self-pay | Admitting: Internal Medicine

## 2020-03-20 DIAGNOSIS — I480 Paroxysmal atrial fibrillation: Secondary | ICD-10-CM

## 2020-03-27 ENCOUNTER — Other Ambulatory Visit: Payer: BC Managed Care – PPO

## 2020-04-08 ENCOUNTER — Other Ambulatory Visit: Payer: Self-pay | Admitting: Internal Medicine

## 2021-02-21 ENCOUNTER — Other Ambulatory Visit: Payer: Self-pay

## 2021-02-21 ENCOUNTER — Encounter: Payer: Self-pay | Admitting: Internal Medicine

## 2021-02-21 ENCOUNTER — Ambulatory Visit: Payer: BC Managed Care – PPO | Admitting: Internal Medicine

## 2021-02-21 VITALS — BP 112/74 | HR 66 | Ht 70.0 in | Wt 214.0 lb

## 2021-02-21 DIAGNOSIS — I43 Cardiomyopathy in diseases classified elsewhere: Secondary | ICD-10-CM | POA: Diagnosis not present

## 2021-02-21 DIAGNOSIS — I7789 Other specified disorders of arteries and arterioles: Secondary | ICD-10-CM

## 2021-02-21 DIAGNOSIS — I4819 Other persistent atrial fibrillation: Secondary | ICD-10-CM

## 2021-02-21 DIAGNOSIS — R000 Tachycardia, unspecified: Secondary | ICD-10-CM | POA: Diagnosis not present

## 2021-02-21 NOTE — Progress Notes (Signed)
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  Since last being seen in our clinic, the patient reports doing very well.  Today, he denies symptoms of palpitations, chest pain, shortness of breath,  lower extremity edema, dizziness, presyncope, or syncope.  The patient is otherwise without complaint today.   Past Medical History:  Diagnosis Date   Chronic systolic dysfunction of left ventricle    Obstructive sleep apnea    mild   Persistent atrial fibrillation ([place])    Past Surgical History:  Procedure Laterality Date   ATRIAL FIBRILLATION ABLATION N/A 05/29/2017   Procedure: ATRIAL FIBRILLATION ABLATION;  Surgeon: [name], MD;  Location: [place] CV LAB;  Service: Cardiovascular;  Laterality: N/A;   CARDIAC CATHETERIZATION     CARDIOVERSION     CARDIOVERSION N/A 12/24/2016   Procedure: CARDIOVERSION;  Surgeon: Josue Hector, MD;  Location: [place];  Service: Cardiovascular;  Laterality: N/A;   CARDIOVERSION N/A 03/20/2017   Procedure: CARDIOVERSION;  Surgeon: Fay Records, MD;  Location: [place];  Service: Cardiovascular;  Laterality: N/A;   COLONOSCOPY WITH PROPOFOL N/A 12/10/2018   Procedure: COLONOSCOPY WITH PROPOFOL;  Surgeon: Jonathon Bellows, MD;  Location: [facility] ENDOSCOPY;  Service: Gastroenterology;  Laterality: N/A;   ELECTROPHYSIOLOGIC STUDY N/A 12/28/2014   Procedure: CARDIOVERSION;  Surgeon: Corey Skains, MD;  Location: [facility] ORS;  Service: Cardiovascular;  Laterality: N/A;   ELECTROPHYSIOLOGIC STUDY N/A 09/07/2015   Procedure: CARDIOVERSION;  Surgeon: Corey Skains, MD;  Location: [facility] ORS;  Service: Cardiovascular;  Laterality: N/A;   ELECTROPHYSIOLOGIC STUDY N/A 03/18/2016   Procedure: Atrial Fibrillation Ablation;  Surgeon: [name], MD;  Location: [place] CV LAB;  Service: Cardiovascular;  Laterality: N/A;   RIGHT/LEFT HEART CATH AND CORONARY  ANGIOGRAPHY N/A 08/06/2017   Procedure: RIGHT/LEFT HEART CATH AND CORONARY ANGIOGRAPHY;  Surgeon: Martinique, Peter M, MD;  Location: [place] CV LAB;  Service: Cardiovascular;  Laterality: N/A;   TEE WITHOUT CARDIOVERSION N/A 09/07/2015   Procedure: TRANSESOPHAGEAL ECHOCARDIOGRAM (TEE);  Surgeon: Corey Skains, MD;  Location: [facility] ORS;  Service: Cardiovascular;  Laterality: N/A;   TEE WITHOUT CARDIOVERSION N/A 03/17/2016   Procedure: TRANSESOPHAGEAL ECHOCARDIOGRAM (TEE);  Surgeon: Lelon Perla, MD;  Location: [facility] ENDOSCOPY;  Service: Cardiovascular;  Laterality: N/A;   TEE WITHOUT CARDIOVERSION N/A 12/24/2016   Procedure: TRANSESOPHAGEAL ECHOCARDIOGRAM (TEE);  Surgeon: Josue Hector, MD;  Location: [facility] ENDOSCOPY;  Service: Cardiovascular;  Laterality: N/A;   TEE WITHOUT CARDIOVERSION N/A 05/29/2017   Procedure: TRANSESOPHAGEAL ECHOCARDIOGRAM (TEE);  Surgeon: Lelon Perla, MD;  Location: [facility] ENDOSCOPY;  Service: Cardiovascular;  Laterality: N/A;    ROS- all systems are reviewed and negatives except as per HPI above  Current Outpatient Medications  Medication Sig Dispense Refill   acetaminophen (TYLENOL) 500 MG tablet Take 1,000 mg by mouth 2 (two) times daily as needed for mild pain or moderate pain.      apixaban (ELIQUIS) 5 MG TABS tablet TAKE 1 TABLET (5 MG TOTAL) 2 (TWO) TIMES DAILY BY MOUTH.  Overdue for follow-up and bloodwork. MUST see MD for future refills. 60 tablet 1   ascorbic acid (VITAMIN C) 1000 MG tablet Take 2 tablets by mouth daily.     busPIRone (BUSPAR) 10 MG tablet Take 10 mg by mouth 2 (two) times daily.     finasteride (PROPECIA) 1 MG tablet Take 1 tablet by mouth daily.  flecainide (TAMBOCOR) 50 MG tablet Take 1 tablet (50 mg total) by mouth 2 (two) times daily. 180 tablet 3   tadalafil (CIALIS) 5 MG tablet Take 1 tablet (5 mg total) by mouth daily as needed for erectile dysfunction. 10 tablet 3   No current facility-administered medications for this visit.     Physical Exam: Vitals:   02/21/21 0924  BP: 112/74  Pulse: 66  SpO2: 95%  Weight: 214 lb (97.1 kg)  Height: 5\' 10"  (1.778 m)    GEN- The patient is well appearing, alert and oriented x 3 today.   Head- normocephalic, atraumatic Eyes-  Sclera clear, conjunctiva pink Ears- hearing intact Oropharynx- clear Lungs- Clear to ausculation bilaterally, normal work of breathing Heart- Regular rate and rhythm, no murmurs, rubs or gallops, PMI not laterally displaced GI- soft, NT, ND, + BS Extremities- no clubbing, cyanosis, or edema  Wt Readings from Last 3 Encounters:  02/21/21 214 lb (97.1 kg)  03/12/20 215 lb 9.6 oz (97.8 kg)  12/10/18 215 lb (97.5 kg)    EKG tracing ordered today is personally reviewed and shows sinus, PR 142 msec  Assessment and Plan:  Persistent atrial fibrillatino Well controlled post ablation He wishes to continue flecainide  Chads2vasc score is 1.  He is on eliquis and does not wish to stop at this time  2. Aortic root enlargement Previously measured 4.3 cm Repeat echo ordered last visit but not obtained by patient  3. Tachycardia mediated CM Resolved with sinus  Risks, benefits and potential toxicities for medications prescribed and/or refilled reviewed with patient today.   Follow-up with me in clinic in a year  [name] MD, [facility] 02/21/2021 9:31 AM

## 2021-02-21 NOTE — Patient Instructions (Addendum)
Medication Instructions:  Your physician recommends that you continue on your current medications as directed. Please refer to the Current Medication list given to you today. *If you need a refill on your cardiac medications before your next appointment, please call your pharmacy*  Lab Work: None. If you have labs (blood work) drawn today and your tests are completely normal, you will receive your results only by: [place] (if you have MyChart) OR A paper copy in the mail If you have any lab test that is abnormal or we need to change your treatment, we will call you to review the results.  Testing/Procedures: Your physician has requested that you have an echocardiogram. Echocardiography is a painless test that uses sound waves to create images of your heart. It provides your doctor with information about the size and shape of your heart and how well your hearts chambers and valves are working. This procedure takes approximately one hour. There are no restrictions for this procedure.   Follow-Up: At [facility], you and your health needs are our priority.  As part of our continuing mission to provide you with exceptional heart care, we have created designated Provider Care Teams.  These Care Teams include your primary Cardiologist (physician) and Advanced Practice Providers (APPs -  Physician Assistants and Nurse Practitioners) who all work together to provide you with the care you need, when you need it.  Your physician wants you to follow-up in: 12 months with Francisco Reed     You will receive a reminder letter in the mail two months in advance. If you don't receive a letter, please call our office to schedule the follow-up appointment.  We recommend signing up for the patient portal called "MyChart".  Sign up information is provided on this After Visit Summary.  MyChart is used to connect with patients for Virtual Visits (Telemedicine).  Patients are able to view lab/test results,  encounter notes, upcoming appointments, etc.  Non-urgent messages can be sent to your provider as well.   To learn more about what you can do with MyChart, go to [email or web address].    Any Other Special Instructions Will Be Listed Below (If Applicable).

## 2021-03-11 ENCOUNTER — Other Ambulatory Visit: Payer: Self-pay

## 2021-03-11 ENCOUNTER — Ambulatory Visit (HOSPITAL_COMMUNITY): Payer: BC Managed Care – PPO | Attending: Internal Medicine

## 2021-03-11 DIAGNOSIS — I4819 Other persistent atrial fibrillation: Secondary | ICD-10-CM | POA: Diagnosis present

## 2021-03-11 DIAGNOSIS — I43 Cardiomyopathy in diseases classified elsewhere: Secondary | ICD-10-CM | POA: Diagnosis not present

## 2021-03-11 DIAGNOSIS — I7789 Other specified disorders of arteries and arterioles: Secondary | ICD-10-CM | POA: Insufficient documentation

## 2021-03-11 DIAGNOSIS — R000 Tachycardia, unspecified: Secondary | ICD-10-CM

## 2021-03-11 LAB — ECHOCARDIOGRAM COMPLETE
Area-P 1/2: 3.02 cm2
P 1/2 time: 896 msec
S' Lateral: 4 cm

## 2021-03-18 ENCOUNTER — Ambulatory Visit: Payer: BC Managed Care – PPO | Admitting: Internal Medicine

## 2021-04-18 ENCOUNTER — Other Ambulatory Visit: Payer: Self-pay | Admitting: Internal Medicine

## 2021-12-04 DIAGNOSIS — M79605 Pain in left leg: Secondary | ICD-10-CM | POA: Diagnosis not present

## 2021-12-04 DIAGNOSIS — S86812A Strain of other muscle(s) and tendon(s) at lower leg level, left leg, initial encounter: Secondary | ICD-10-CM | POA: Diagnosis not present

## 2021-12-27 DIAGNOSIS — S86112D Strain of other muscle(s) and tendon(s) of posterior muscle group at lower leg level, left leg, subsequent encounter: Secondary | ICD-10-CM | POA: Diagnosis not present

## 2022-01-01 ENCOUNTER — Other Ambulatory Visit: Payer: Self-pay | Admitting: Orthopedic Surgery

## 2022-01-01 DIAGNOSIS — S86112D Strain of other muscle(s) and tendon(s) of posterior muscle group at lower leg level, left leg, subsequent encounter: Secondary | ICD-10-CM

## 2022-01-13 ENCOUNTER — Ambulatory Visit
Admission: RE | Admit: 2022-01-13 | Discharge: 2022-01-13 | Disposition: A | Discharge status: Home / Self Care | Payer: BC Managed Care – PPO | Source: Ambulatory Visit | Attending: Orthopedic Surgery | Admitting: Orthopedic Surgery

## 2022-01-13 DIAGNOSIS — S86112D Strain of other muscle(s) and tendon(s) of posterior muscle group at lower leg level, left leg, subsequent encounter: Secondary | ICD-10-CM

## 2022-01-24 ENCOUNTER — Other Ambulatory Visit: Payer: Self-pay | Admitting: Internal Medicine

## 2022-01-27 DIAGNOSIS — D225 Melanocytic nevi of trunk: Secondary | ICD-10-CM | POA: Diagnosis not present

## 2022-01-27 DIAGNOSIS — D2261 Melanocytic nevi of right upper limb, including shoulder: Secondary | ICD-10-CM | POA: Diagnosis not present

## 2022-01-27 DIAGNOSIS — D2262 Melanocytic nevi of left upper limb, including shoulder: Secondary | ICD-10-CM | POA: Diagnosis not present

## 2022-01-27 DIAGNOSIS — D2272 Melanocytic nevi of left lower limb, including hip: Secondary | ICD-10-CM | POA: Diagnosis not present

## 2022-01-27 DIAGNOSIS — L570 Actinic keratosis: Secondary | ICD-10-CM | POA: Diagnosis not present

## 2022-03-03 DIAGNOSIS — R0789 Other chest pain: Secondary | ICD-10-CM | POA: Diagnosis not present

## 2022-03-03 DIAGNOSIS — I771 Stricture of artery: Secondary | ICD-10-CM | POA: Diagnosis not present

## 2022-06-11 DIAGNOSIS — H524 Presbyopia: Secondary | ICD-10-CM | POA: Diagnosis not present

## 2022-06-27 DIAGNOSIS — R739 Hyperglycemia, unspecified: Secondary | ICD-10-CM | POA: Diagnosis not present

## 2022-06-27 DIAGNOSIS — E782 Mixed hyperlipidemia: Secondary | ICD-10-CM | POA: Diagnosis not present

## 2022-06-27 DIAGNOSIS — Z125 Encounter for screening for malignant neoplasm of prostate: Secondary | ICD-10-CM | POA: Diagnosis not present

## 2022-06-27 DIAGNOSIS — Z79899 Other long term (current) drug therapy: Secondary | ICD-10-CM | POA: Diagnosis not present

## 2022-07-15 ENCOUNTER — Ambulatory Visit (HOSPITAL_COMMUNITY)
Admission: RE | Admit: 2022-07-15 | Discharge: 2022-07-15 | Disposition: A | Discharge status: Home / Self Care | Payer: BC Managed Care – PPO | Source: Ambulatory Visit | Attending: Internal Medicine | Admitting: Internal Medicine

## 2022-07-15 VITALS — BP 146/80 | HR 59 | Ht 70.0 in | Wt 205.4 lb

## 2022-07-15 DIAGNOSIS — I4819 Other persistent atrial fibrillation: Secondary | ICD-10-CM

## 2022-07-15 DIAGNOSIS — Z7901 Long term (current) use of anticoagulants: Secondary | ICD-10-CM | POA: Insufficient documentation

## 2022-07-15 DIAGNOSIS — R001 Bradycardia, unspecified: Secondary | ICD-10-CM | POA: Insufficient documentation

## 2022-07-15 DIAGNOSIS — Z79899 Other long term (current) drug therapy: Secondary | ICD-10-CM | POA: Diagnosis not present

## 2022-07-15 NOTE — Progress Notes (Signed)
Primary Care Physician: Marguarite Arbour, MD Referring Physician:Dr. Johney Frame   Francisco Reed is a 62 y.o. male with a h/o persistent afib s/p 2 ablations, with return of afib. He was placed on flecainide 50 mg bid, returned to prior BB dose and restarted eliquis. He self converted on flecainide after about 4 days on drug. He is back in the office and continues in SR. He feels well. He wants to continue on all drugs that he is currently on. Recent cardiac cath showed nonobstructive disease with 35% stenosis of LAD and with normal EF by cardiac cath.    On follow up 07/15/22, he is currently in NSR. He has not had any episodes of Afib recently. He has a history of 2 ablations (last in 05/2017) and multiple DCCV's. He is currently on flecainide and Eliquis 5 mg BID. He currently does not meet guidelines for anticoagulation but wishes to continue Eliquis; his father had a stroke because he stopped his blood thinner and he wishes to prevent this.   Today, he denies symptoms of palpitations, chest pain, shortness of breath, orthopnea, PND, lower extremity edema, dizziness, presyncope, syncope, or neurologic sequela. The patient is tolerating medications without difficulties and is otherwise without complaint today.     Past Medical History:  Diagnosis Date   Chronic systolic dysfunction of left ventricle    Obstructive sleep apnea    mild   Persistent atrial fibrillation (HCC)    Past Surgical History:  Procedure Laterality Date   ATRIAL FIBRILLATION ABLATION N/A 05/29/2017   Procedure: ATRIAL FIBRILLATION ABLATION;  Surgeon: Hillis Range, MD;  Location: [facility] INVASIVE CV LAB;  Service: Cardiovascular;  Laterality: N/A;   CARDIAC CATHETERIZATION     CARDIOVERSION     CARDIOVERSION N/A 12/24/2016   Procedure: CARDIOVERSION;  Surgeon: Wendall Stade, MD;  Location: [facility] ENDOSCOPY;  Service: Cardiovascular;  Laterality: N/A;   CARDIOVERSION N/A 03/20/2017   Procedure: CARDIOVERSION;  Surgeon: Pricilla Riffle, MD;  Location: [facility] ENDOSCOPY;  Service: Cardiovascular;  Laterality: N/A;   COLONOSCOPY WITH PROPOFOL N/A 12/10/2018   Procedure: COLONOSCOPY WITH PROPOFOL;  Surgeon: Wyline Mood, MD;  Location: [facility] ENDOSCOPY;  Service: Gastroenterology;  Laterality: N/A;   ELECTROPHYSIOLOGIC STUDY N/A 12/28/2014   Procedure: CARDIOVERSION;  Surgeon: Lamar Blinks, MD;  Location: [facility] ORS;  Service: Cardiovascular;  Laterality: N/A;   ELECTROPHYSIOLOGIC STUDY N/A 09/07/2015   Procedure: CARDIOVERSION;  Surgeon: Lamar Blinks, MD;  Location: [facility] ORS;  Service: Cardiovascular;  Laterality: N/A;   ELECTROPHYSIOLOGIC STUDY N/A 03/18/2016   Procedure: Atrial Fibrillation Ablation;  Surgeon: Hillis Range, MD;  Location: [facility] INVASIVE CV LAB;  Service: Cardiovascular;  Laterality: N/A;   RIGHT/LEFT HEART CATH AND CORONARY ANGIOGRAPHY N/A 08/06/2017   Procedure: RIGHT/LEFT HEART CATH AND CORONARY ANGIOGRAPHY;  Surgeon: Swaziland, Peter M, MD;  Location: [facility] INVASIVE CV LAB;  Service: Cardiovascular;  Laterality: N/A;   TEE WITHOUT CARDIOVERSION N/A 09/07/2015   Procedure: TRANSESOPHAGEAL ECHOCARDIOGRAM (TEE);  Surgeon: Lamar Blinks, MD;  Location: [facility] ORS;  Service: Cardiovascular;  Laterality: N/A;   TEE WITHOUT CARDIOVERSION N/A 03/17/2016   Procedure: TRANSESOPHAGEAL ECHOCARDIOGRAM (TEE);  Surgeon: Lewayne Bunting, MD;  Location: [facility] ENDOSCOPY;  Service: Cardiovascular;  Laterality: N/A;   TEE WITHOUT CARDIOVERSION N/A 12/24/2016   Procedure: TRANSESOPHAGEAL ECHOCARDIOGRAM (TEE);  Surgeon: Wendall Stade, MD;  Location: [facility] ENDOSCOPY;  Service: Cardiovascular;  Laterality: N/A;   TEE WITHOUT CARDIOVERSION N/A 05/29/2017   Procedure: TRANSESOPHAGEAL ECHOCARDIOGRAM (TEE);  Surgeon: Olga Millers  S, MD;  Location: [facility] ENDOSCOPY;  Service: Cardiovascular;  Laterality: N/A;    Current Outpatient Medications  Medication Sig Dispense Refill   acetaminophen (TYLENOL) 500 MG tablet Take 1,000 mg by mouth 2 (two)  times daily as needed for mild pain or moderate pain.      apixaban (ELIQUIS) 5 MG TABS tablet TAKE 1 TABLET (5 MG TOTAL) 2 (TWO) TIMES DAILY BY MOUTH.  Overdue for follow-up and bloodwork. MUST see MD for future refills. 60 tablet 1   ascorbic acid (VITAMIN C) 1000 MG tablet Take 2 tablets by mouth daily.     busPIRone (BUSPAR) 10 MG tablet Take 10 mg by mouth 2 (two) times daily.     finasteride (PROPECIA) 1 MG tablet Take 1 tablet by mouth daily.     flecainide (TAMBOCOR) 50 MG tablet TAKE 1 TABLET BY MOUTH TWICE A DAY 60 tablet 0   tadalafil (CIALIS) 5 MG tablet Take 1 tablet (5 mg total) by mouth daily as needed for erectile dysfunction. 10 tablet 3   No current facility-administered medications for this visit.    No Known Allergies  Social History   Socioeconomic History   Marital status: Married    Spouse name: Not on file   Number of children: Not on file   Years of education: Not on file   Highest education level: Not on file  Occupational History   Not on file  Tobacco Use   Smoking status: Never   Smokeless tobacco: Never  Vaping Use   Vaping Use: Never used  Substance and Sexual Activity   Alcohol use: No   Drug use: No   Sexual activity: Not on file  Other Topics Concern   Not on file  Social History Narrative   Pt lives in [place] with spouse.  Works as a Surveyor, minerals.   Social Determinants of Health   Financial Resource Strain: Not on file  Food Insecurity: Not on file  Transportation Needs: Not on file  Physical Activity: Not on file  Stress: Not on file  Social Connections: Not on file  Intimate Partner Violence: Not on file    No family history on file.  ROS- All systems are reviewed and negative except as per the HPI above  Physical Exam: There were no vitals filed for this visit.  Wt Readings from Last 3 Encounters:  02/21/21 97.1 kg  03/12/20 97.8 kg  12/10/18 97.5 kg    Labs: Lab Results  Component Value Date   NA 139 11/15/2018    K 3.8 11/15/2018   CL 106 11/15/2018   CO2 25 11/15/2018   GLUCOSE 96 11/15/2018   BUN 16 11/15/2018   CREATININE 1.29 (H) 11/15/2018   CALCIUM 8.9 11/15/2018   MG 1.8 09/06/2013   Lab Results  Component Value Date   INR 1.2 09/06/2013   No results found for: "CHOL", "HDL", "LDLCALC", "TRIG"  GEN- The patient is well appearing, alert and oriented x 3 today.   Head- normocephalic, atraumatic Eyes-  Sclera clear, conjunctiva pink Ears- hearing intact Lungs- Clear to ausculation bilaterally, normal work of breathing Heart- Regular rate and rhythm, no murmurs, rubs or gallops, PMI not laterally displaced Extremities- no clubbing, cyanosis, or edema MS- no significant deformity or atrophy Skin- no rash or lesion Psych- euthymic mood, full affect Neuro- strength and sensation are intact   EKG- Vent. rate 59 BPM PR interval 162 ms QRS duration 102 ms QT/QTcB 410/405 ms P-R-T axes 15 16 18  Sinus bradycardia  Otherwise normal ECG When compared with ECG of 28-Sep-2017 08:38, PREVIOUS ECG IS PRESENT  ECHO 03/11/21:  1. Left ventricular ejection fraction, by estimation, is 50 to 55%. The  left ventricle has low normal function. The left ventricle has no regional  wall motion abnormalities. The left ventricular internal cavity size was  mildly dilated. Left ventricular  diastolic parameters were normal.   2. Right ventricular systolic function is normal. The right ventricular  size is normal.   3. The mitral valve is normal in structure. Mild mitral valve  regurgitation.   4. The aortic valve is normal in structure. Aortic valve regurgitation is  trivial.   5. Aortic dilatation noted. There is borderline dilatation of the aortic  root, measuring 38 mm. There is mild dilatation of the ascending aorta,  measuring 42 mm.    Assessment and Plan: 1. Persistent afib  He is currently in NSR.  Converted to SR on flecainide Continue flecainide 50 mg bid  We discussed with  him currently being on flecainide would like him to f/u every 6 months. He is currently not on an AV nodal agent due to flecainide keeping him bradycardic. He was on metoprolol historically but it caused hypotension. With regards to heart cath history, if he has progression of mild nonobstructive CAD in the future would have to discontinue flecainide and revisit treatment options.   2. CHA2DS2VASc score of 0 Pt prefers to stay on eliquis 5 mg bid for now   F/u 6 months Afib clinic  [place]ells, PA-C Afib [facility] [address] [place], [place] [address] [phone]

## 2022-08-12 ENCOUNTER — Encounter (HOSPITAL_COMMUNITY): Payer: Self-pay

## 2022-11-19 DIAGNOSIS — H2511 Age-related nuclear cataract, right eye: Secondary | ICD-10-CM | POA: Diagnosis not present

## 2022-11-19 DIAGNOSIS — H04123 Dry eye syndrome of bilateral lacrimal glands: Secondary | ICD-10-CM | POA: Diagnosis not present

## 2022-11-19 DIAGNOSIS — H2513 Age-related nuclear cataract, bilateral: Secondary | ICD-10-CM | POA: Diagnosis not present

## 2022-11-19 DIAGNOSIS — H2512 Age-related nuclear cataract, left eye: Secondary | ICD-10-CM | POA: Diagnosis not present

## 2022-11-19 DIAGNOSIS — H538 Other visual disturbances: Secondary | ICD-10-CM | POA: Diagnosis not present

## 2022-11-19 DIAGNOSIS — H25041 Posterior subcapsular polar age-related cataract, right eye: Secondary | ICD-10-CM | POA: Diagnosis not present

## 2022-12-01 DIAGNOSIS — H2511 Age-related nuclear cataract, right eye: Secondary | ICD-10-CM | POA: Diagnosis not present

## 2022-12-01 DIAGNOSIS — H25041 Posterior subcapsular polar age-related cataract, right eye: Secondary | ICD-10-CM | POA: Diagnosis not present

## 2022-12-11 ENCOUNTER — Encounter: Payer: Self-pay | Admitting: Ophthalmology

## 2022-12-12 DIAGNOSIS — S86111A Strain of other muscle(s) and tendon(s) of posterior muscle group at lower leg level, right leg, initial encounter: Secondary | ICD-10-CM | POA: Diagnosis not present

## 2022-12-17 DIAGNOSIS — S86111A Strain of other muscle(s) and tendon(s) of posterior muscle group at lower leg level, right leg, initial encounter: Secondary | ICD-10-CM | POA: Diagnosis not present

## 2022-12-17 NOTE — Discharge Instructions (Signed)

## 2022-12-18 ENCOUNTER — Encounter
Admission: RE | Disposition: A | Discharge status: Home / Self Care | Payer: Self-pay | Source: Home / Self Care | Attending: Ophthalmology

## 2022-12-18 ENCOUNTER — Other Ambulatory Visit: Payer: Self-pay

## 2022-12-18 ENCOUNTER — Encounter: Payer: Self-pay | Admitting: Ophthalmology

## 2022-12-18 ENCOUNTER — Ambulatory Visit: Payer: BC Managed Care – PPO | Admitting: Anesthesiology

## 2022-12-18 ENCOUNTER — Ambulatory Visit
Admission: RE | Admit: 2022-12-18 | Discharge: 2022-12-18 | Disposition: A | Discharge status: Home / Self Care | Payer: BC Managed Care – PPO | Attending: Ophthalmology | Admitting: Ophthalmology

## 2022-12-18 DIAGNOSIS — Z7901 Long term (current) use of anticoagulants: Secondary | ICD-10-CM | POA: Diagnosis not present

## 2022-12-18 DIAGNOSIS — I4891 Unspecified atrial fibrillation: Secondary | ICD-10-CM | POA: Insufficient documentation

## 2022-12-18 DIAGNOSIS — H25041 Posterior subcapsular polar age-related cataract, right eye: Secondary | ICD-10-CM | POA: Diagnosis not present

## 2022-12-18 DIAGNOSIS — H2511 Age-related nuclear cataract, right eye: Secondary | ICD-10-CM | POA: Insufficient documentation

## 2022-12-18 DIAGNOSIS — G4730 Sleep apnea, unspecified: Secondary | ICD-10-CM | POA: Diagnosis not present

## 2022-12-18 DIAGNOSIS — I4819 Other persistent atrial fibrillation: Secondary | ICD-10-CM | POA: Diagnosis not present

## 2022-12-18 HISTORY — PX: CATARACT EXTRACTION W/PHACO: SHX586

## 2022-12-18 HISTORY — DX: Presence of external hearing-aid: Z97.4

## 2022-12-18 HISTORY — DX: Unspecified osteoarthritis, unspecified site: M19.90

## 2022-12-18 SURGERY — PHACOEMULSIFICATION, CATARACT, WITH IOL INSERTION
Anesthesia: Monitor Anesthesia Care | Site: Eye | Laterality: Right

## 2022-12-18 MED ORDER — SODIUM CHLORIDE 0.9% FLUSH: 10.0000 mL | INTRAVENOUS | Status: DC | PRN

## 2022-12-18 MED ORDER — TETRACAINE HCL 0.5 % OP SOLN
1.0000 [drp] | OPHTHALMIC | Status: DC | PRN
  Administered 2022-12-18 (×3): 1 [drp] via OPHTHALMIC

## 2022-12-18 MED ORDER — TETRACAINE HCL 0.5 % OP SOLN
OPHTHALMIC | Status: AC
  Filled 2022-12-18: qty 4

## 2022-12-18 MED ORDER — SIGHTPATH DOSE#1 BSS IO SOLN
INTRAOCULAR | Status: DC | PRN
  Administered 2022-12-18: 15 mL via INTRAOCULAR

## 2022-12-18 MED ORDER — FENTANYL CITRATE (PF) 100 MCG/2ML IJ SOLN
INTRAMUSCULAR | Status: AC
  Filled 2022-12-18: qty 2

## 2022-12-18 MED ORDER — FENTANYL CITRATE (PF) 100 MCG/2ML IJ SOLN
INTRAMUSCULAR | Status: DC | PRN
  Administered 2022-12-18 (×2): 50 ug via INTRAVENOUS

## 2022-12-18 MED ORDER — MOXIFLOXACIN HCL 0.5 % OP SOLN
OPHTHALMIC | Status: DC | PRN
  Administered 2022-12-18: .2 mL via OPHTHALMIC

## 2022-12-18 MED ORDER — SIGHTPATH DOSE#1 NA HYALUR & NA CHOND-NA HYALUR IO KIT
PACK | INTRAOCULAR | Status: DC | PRN
  Administered 2022-12-18: 1 via OPHTHALMIC

## 2022-12-18 MED ORDER — ARMC OPHTHALMIC DILATING DROPS
1.0000 | OPHTHALMIC | Status: DC | PRN
  Administered 2022-12-18 (×3): 1 via OPHTHALMIC

## 2022-12-18 MED ORDER — LACTATED RINGERS IV SOLN: INTRAVENOUS | Status: DC

## 2022-12-18 MED ORDER — MIDAZOLAM HCL 2 MG/2ML IJ SOLN
INTRAMUSCULAR | Status: AC
  Filled 2022-12-18: qty 2

## 2022-12-18 MED ORDER — LIDOCAINE HCL (PF) 2 % IJ SOLN
INTRAOCULAR | Status: DC | PRN
  Administered 2022-12-18: 4 mL via INTRAOCULAR

## 2022-12-18 MED ORDER — ARMC OPHTHALMIC DILATING DROPS
OPHTHALMIC | Status: AC
  Filled 2022-12-18: qty 0.5

## 2022-12-18 MED ORDER — BRIMONIDINE TARTRATE-TIMOLOL 0.2-0.5 % OP SOLN
OPHTHALMIC | Status: DC | PRN
  Administered 2022-12-18: 1 [drp] via OPHTHALMIC

## 2022-12-18 MED ORDER — SIGHTPATH DOSE#1 BSS IO SOLN
INTRAOCULAR | Status: DC | PRN
  Administered 2022-12-18: 69 mL via OPHTHALMIC

## 2022-12-18 MED ORDER — MIDAZOLAM HCL 2 MG/2ML IJ SOLN
INTRAMUSCULAR | Status: DC | PRN
  Administered 2022-12-18: 2 mg via INTRAVENOUS

## 2022-12-18 SURGICAL SUPPLY — 11 items
CANNULA ANT/CHMB 27G (MISCELLANEOUS) IMPLANT
CANNULA ANT/CHMB 27GA (MISCELLANEOUS)
CATARACT SUITE SIGHTPATH (MISCELLANEOUS) ×1
DISSECTOR HYDRO NUCLEUS 50X22 (MISCELLANEOUS) ×1 IMPLANT
DRSG TEGADERM 2-3/8X2-3/4 SM (GAUZE/BANDAGES/DRESSINGS) ×1 IMPLANT
FEE CATARACT SUITE SIGHTPATH (MISCELLANEOUS) ×1 IMPLANT
GLOVE SURG SYN 7.5 E (GLOVE) ×1
GLOVE SURG SYN 7.5 PF PI (GLOVE) ×1 IMPLANT
GLOVE SURG SYN 8.5 E (GLOVE) ×1
GLOVE SURG SYN 8.5 PF PI (GLOVE) ×1 IMPLANT
LENS IOL TECNIS EYHANCE 20.0 (Intraocular Lens) IMPLANT

## 2022-12-18 NOTE — Op Note (Signed)
OPERATIVE NOTE  Francisco Reed [id number] 12/18/2022   PREOPERATIVE DIAGNOSIS: Nuclear sclerotic cataract right eye. H25.11   POSTOPERATIVE DIAGNOSIS: Nuclear sclerotic cataract right eye. H25.11   PROCEDURE:  Phacoemusification with posterior chamber intraocular lens placement of the right eye  Ultrasound time: Procedure(s): CATARACT EXTRACTION PHACO AND INTRAOCULAR LENS PLACEMENT (IOC) RIGHT 5.39 00:32.8 (Right)  LENS:   Implant Name Type Inv. Item Serial No. Manufacturer Lot No. LRB No. Used Action  LENS IOL TECNIS EYHANCE 20.0 - [id number] Intraocular Lens LENS IOL TECNIS EYHANCE 20.0 [id number] [employer]  Right 1 Implanted      SURGEON:  Julious Payer. Rolley Sims, MD   ANESTHESIA:  Topical with tetracaine drops, augmented with 1% preservative-free intracameral lidocaine.   COMPLICATIONS:  None.   DESCRIPTION OF PROCEDURE:  The patient was identified in the holding room and transported to the operating room and placed in the supine position under the operating microscope.  The right eye was identified as the operative eye, which was prepped and draped in the usual sterile ophthalmic fashion.   A 1 millimeter clear-corneal paracentesis was made superotemporally. Preservative-free 1% lidocaine mixed with 1:1,000 bisulfite-free aqueous solution of epinephrine was injected into the anterior chamber. The anterior chamber was then filled with Viscoat viscoelastic. A 2.4 millimeter keratome was used to make a clear-corneal incision inferotemporally. A curvilinear capsulorrhexis was made with a cystotome and capsulorrhexis forceps. Balanced salt solution was used to hydrodissect and hydrodelineate the nucleus. Phacoemulsification was then used to remove the lens nucleus and epinucleus. The remaining cortex was then removed using the irrigation and aspiration handpiece. Provisc was then placed into the capsular bag to distend it for lens placement. A +20.00 D DIB00 intraocular lens was then injected  into the capsular bag. The remaining viscoelastic was aspirated.   Wounds were hydrated with balanced salt solution.  The anterior chamber was inflated to a physiologic pressure with balanced salt solution.  No wound leaks were noted. Moxifloxacin was injected intracamerally.  Timolol and Brimonidine drops were applied to the eye.  The patient was taken to the recovery room in stable condition without complications of anesthesia or surgery.  [employer] 12/18/2022, 11:06 AM

## 2022-12-18 NOTE — Anesthesia Preprocedure Evaluation (Signed)
Anesthesia Evaluation  Patient identified by MRN, date of birth, ID band Patient awake    Reviewed: Allergy & Precautions, NPO status , Patient's Chart, lab work & pertinent test results  Airway Mallampati: III  TM Distance: >3 FB Neck ROM: full    Dental  (+) Chipped, Dental Advidsory Given   Pulmonary sleep apnea and Continuous Positive Airway Pressure Ventilation    Pulmonary exam normal        Cardiovascular + dysrhythmias Atrial Fibrillation      Neuro/Psych negative neurological ROS  negative psych ROS   GI/Hepatic Neg liver ROS,GERD  Medicated,,  Endo/Other  negative endocrine ROS    Renal/GU      Musculoskeletal   Abdominal   Peds  Hematology negative hematology ROS (+)   Anesthesia Other Findings Past Medical History: No date: Arthritis     Comment:  hands No date: Chronic systolic dysfunction of left ventricle No date: Obstructive sleep apnea     Comment:  mild No date: Persistent atrial fibrillation (HCC) No date: Wears hearing aid in both ears     Comment:  12/11/22.  Currently non-functional  Past Surgical History: 05/29/2017: ATRIAL FIBRILLATION ABLATION; N/A     Comment:  Procedure: ATRIAL FIBRILLATION ABLATION;  Surgeon:               Hillis Range, MD;  Location: [facility] INVASIVE CV LAB;                Service: Cardiovascular;  Laterality: N/A; No date: CARDIAC CATHETERIZATION No date: CARDIOVERSION 12/24/2016: CARDIOVERSION; N/A     Comment:  Procedure: CARDIOVERSION;  Surgeon: Wendall Stade, MD;              Location: [facility] ENDOSCOPY;  Service: Cardiovascular;                Laterality: N/A; 03/20/2017: CARDIOVERSION; N/A     Comment:  Procedure: CARDIOVERSION;  Surgeon: Pricilla Riffle, MD;                Location: [facility] ENDOSCOPY;  Service: Cardiovascular;                Laterality: N/A; 12/10/2018: COLONOSCOPY WITH PROPOFOL; N/A     Comment:  Procedure: COLONOSCOPY WITH PROPOFOL;  Surgeon: Wyline Mood, MD;  Location: [facility] ENDOSCOPY;  Service:               Gastroenterology;  Laterality: N/A; 12/28/2014: ELECTROPHYSIOLOGIC STUDY; N/A     Comment:  Procedure: CARDIOVERSION;  Surgeon: Lamar Blinks,               MD;  Location: [facility] ORS;  Service: Cardiovascular;                Laterality: N/A; 09/07/2015: ELECTROPHYSIOLOGIC STUDY; N/A     Comment:  Procedure: CARDIOVERSION;  Surgeon: Lamar Blinks,               MD;  Location: [facility] ORS;  Service: Cardiovascular;                Laterality: N/A; 03/18/2016: ELECTROPHYSIOLOGIC STUDY; N/A     Comment:  Procedure: Atrial Fibrillation Ablation;  Surgeon: Hillis Range, MD;  Location: [facility] INVASIVE CV LAB;  Service:               Cardiovascular;  Laterality: N/A; 08/06/2017: RIGHT/LEFT HEART CATH AND CORONARY ANGIOGRAPHY; N/A     Comment:  Procedure: RIGHT/LEFT HEART CATH AND CORONARY               ANGIOGRAPHY;  Surgeon: Swaziland, Peter M, MD;  Location: [facility]              INVASIVE CV LAB;  Service: Cardiovascular;  Laterality:               N/A; 09/07/2015: TEE WITHOUT CARDIOVERSION; N/A     Comment:  Procedure: TRANSESOPHAGEAL ECHOCARDIOGRAM (TEE);                Surgeon: Lamar Blinks, MD;  Location: [facility] ORS;                Service: Cardiovascular;  Laterality: N/A; 03/17/2016: TEE WITHOUT CARDIOVERSION; N/A     Comment:  Procedure: TRANSESOPHAGEAL ECHOCARDIOGRAM (TEE);                Surgeon: Lewayne Bunting, MD;  Location: [facility] ENDOSCOPY;                Service: Cardiovascular;  Laterality: N/A; 12/24/2016: TEE WITHOUT CARDIOVERSION; N/A     Comment:  Procedure: TRANSESOPHAGEAL ECHOCARDIOGRAM (TEE);                Surgeon: Wendall Stade, MD;  Location: [facility] ENDOSCOPY;                Service: Cardiovascular;  Laterality: N/A; 05/29/2017: TEE WITHOUT CARDIOVERSION; N/A     Comment:  Procedure: TRANSESOPHAGEAL ECHOCARDIOGRAM (TEE);                Surgeon: Lewayne Bunting, MD;  Location: [facility] ENDOSCOPY;                Service: Cardiovascular;  Laterality: N/A;  BMI    Body Mass Index: 30.13 kg/m      Reproductive/Obstetrics negative OB ROS                             Anesthesia Physical Anesthesia Plan  ASA: 3  Anesthesia Plan: MAC   Post-op Pain Management:    Induction: Intravenous  PONV Risk Score and Plan: 1 and Midazolam and TIVA  Airway Management Planned: Natural Airway and Nasal Cannula  Additional Equipment:   Intra-op Plan:   Post-operative Plan:   Informed Consent: I have reviewed the patients History and Physical, chart, labs and discussed the procedure including the risks, benefits and alternatives for the proposed anesthesia with the patient or authorized representative who has indicated his/her understanding and acceptance.     Dental advisory given  Plan Discussed with: Anesthesiologist and CRNA  Anesthesia Plan Comments: (Patient consented for risks of anesthesia including but not limited to:  - adverse reactions to medications - risk of intubation if required - damage to teeth, lips or other oral mucosa - sore throat or hoarseness - Damage to heart, brain, lungs or loss of life  Patient voiced understanding.)       Anesthesia Quick Evaluation

## 2022-12-18 NOTE — Anesthesia Postprocedure Evaluation (Signed)
Anesthesia Post Note  Patient: BRIXTEN BLAKELY  Procedure(s) Performed: CATARACT EXTRACTION PHACO AND INTRAOCULAR LENS PLACEMENT (IOC) RIGHT 5.39 00:32.8 (Right: Eye)  Patient location during evaluation: PACU Anesthesia Type: MAC Level of consciousness: awake and alert Pain management: pain level controlled Vital Signs Assessment: post-procedure vital signs reviewed and stable Respiratory status: spontaneous breathing, nonlabored ventilation, respiratory function stable and patient connected to nasal cannula oxygen Cardiovascular status: stable and blood pressure returned to baseline Postop Assessment: no apparent nausea or vomiting Anesthetic complications: no  No notable events documented.   Last Vitals:  Vitals:   12/18/22 1107 12/18/22 1114  BP: (!) 105/92 122/74  Pulse: (!) 59 (!) 58  Resp: 10 13  Temp: (!) 36.3 C (!) 36.3 C  SpO2: 96% 96%    Last Pain:  Vitals:   12/18/22 1114  TempSrc:   PainSc: 0-No pain                 Stephanie Coup

## 2022-12-18 NOTE — H&P (Signed)
[facility]   Primary Care Physician:  Marguarite Arbour, MD Ophthalmologist: Dr. Deberah Pelton  Pre-Procedure History & Physical: HPI:  Francisco Reed is a 62 y.o. male here for cataract surgery.   Past Medical History:  Diagnosis Date   Arthritis    hands   Chronic systolic dysfunction of left ventricle    Obstructive sleep apnea    mild   Persistent atrial fibrillation (HCC)    Wears hearing aid in both ears    12/11/22.  Currently non-functional    Past Surgical History:  Procedure Laterality Date   ATRIAL FIBRILLATION ABLATION N/A 05/29/2017   Procedure: ATRIAL FIBRILLATION ABLATION;  Surgeon: Hillis Range, MD;  Location: [facility] INVASIVE CV LAB;  Service: Cardiovascular;  Laterality: N/A;   CARDIAC CATHETERIZATION     CARDIOVERSION     CARDIOVERSION N/A 12/24/2016   Procedure: CARDIOVERSION;  Surgeon: Wendall Stade, MD;  Location: [facility] ENDOSCOPY;  Service: Cardiovascular;  Laterality: N/A;   CARDIOVERSION N/A 03/20/2017   Procedure: CARDIOVERSION;  Surgeon: Pricilla Riffle, MD;  Location: [facility] ENDOSCOPY;  Service: Cardiovascular;  Laterality: N/A;   COLONOSCOPY WITH PROPOFOL N/A 12/10/2018   Procedure: COLONOSCOPY WITH PROPOFOL;  Surgeon: Wyline Mood, MD;  Location: [facility] ENDOSCOPY;  Service: Gastroenterology;  Laterality: N/A;   ELECTROPHYSIOLOGIC STUDY N/A 12/28/2014   Procedure: CARDIOVERSION;  Surgeon: Lamar Blinks, MD;  Location: [facility] ORS;  Service: Cardiovascular;  Laterality: N/A;   ELECTROPHYSIOLOGIC STUDY N/A 09/07/2015   Procedure: CARDIOVERSION;  Surgeon: Lamar Blinks, MD;  Location: [facility] ORS;  Service: Cardiovascular;  Laterality: N/A;   ELECTROPHYSIOLOGIC STUDY N/A 03/18/2016   Procedure: Atrial Fibrillation Ablation;  Surgeon: Hillis Range, MD;  Location: [facility] INVASIVE CV LAB;  Service: Cardiovascular;  Laterality: N/A;   RIGHT/LEFT HEART CATH AND CORONARY ANGIOGRAPHY N/A 08/06/2017   Procedure: RIGHT/LEFT HEART CATH AND CORONARY ANGIOGRAPHY;  Surgeon:  Swaziland, Peter M, MD;  Location: [facility] INVASIVE CV LAB;  Service: Cardiovascular;  Laterality: N/A;   TEE WITHOUT CARDIOVERSION N/A 09/07/2015   Procedure: TRANSESOPHAGEAL ECHOCARDIOGRAM (TEE);  Surgeon: Lamar Blinks, MD;  Location: [facility] ORS;  Service: Cardiovascular;  Laterality: N/A;   TEE WITHOUT CARDIOVERSION N/A 03/17/2016   Procedure: TRANSESOPHAGEAL ECHOCARDIOGRAM (TEE);  Surgeon: Lewayne Bunting, MD;  Location: [facility] ENDOSCOPY;  Service: Cardiovascular;  Laterality: N/A;   TEE WITHOUT CARDIOVERSION N/A 12/24/2016   Procedure: TRANSESOPHAGEAL ECHOCARDIOGRAM (TEE);  Surgeon: Wendall Stade, MD;  Location: [facility] ENDOSCOPY;  Service: Cardiovascular;  Laterality: N/A;   TEE WITHOUT CARDIOVERSION N/A 05/29/2017   Procedure: TRANSESOPHAGEAL ECHOCARDIOGRAM (TEE);  Surgeon: Lewayne Bunting, MD;  Location: [facility] ENDOSCOPY;  Service: Cardiovascular;  Laterality: N/A;    Prior to Admission medications   Medication Sig Start Date End Date Taking? Authorizing Provider  acetaminophen (TYLENOL) 500 MG tablet Take 1,000 mg by mouth 2 (two) times daily as needed for mild pain or moderate pain.    Yes [provider]  apixaban (ELIQUIS) 5 MG TABS tablet TAKE 1 TABLET (5 MG TOTAL) 2 (TWO) TIMES DAILY BY MOUTH.  Overdue for follow-up and bloodwork. MUST see MD for future refills. 01/10/20  Yes Allred, Fayrene Fearing, MD  ascorbic acid (VITAMIN C) 1000 MG tablet Take 2 tablets by mouth daily.   Yes [provider]  Biotin 10 MG CAPS Take 1 capsule by mouth every morning.   Yes [provider]  finasteride (PROPECIA) 1 MG tablet Take 1 tablet by mouth daily. 04/18/20  Yes [provider]  flecainide (TAMBOCOR) 50  MG tablet TAKE 1 TABLET BY MOUTH TWICE A DAY 01/24/22  Yes Lanier Prude, MD  tadalafil (CIALIS) 5 MG tablet Take 1 tablet (5 mg total) by mouth daily as needed for erectile dysfunction. 08/05/18  Yes Allred, Fayrene Fearing, MD  tiZANidine (ZANAFLEX) 4 MG capsule Take 4 mg by mouth 3 (three)  times daily.   Yes [provider]  traMADol (ULTRAM) 50 MG tablet Take by mouth every 6 (six) hours as needed for moderate pain (pain score 4-6).   Yes [provider]  VITAMIN D, ERGOCALCIFEROL, PO Take 1 tablet by mouth every morning.   Yes [provider]    Allergies as of 11/21/2022   (No Known Allergies)    History reviewed. No pertinent family history.  Social History   Socioeconomic History   Marital status: Married    Spouse name: Not on file   Number of children: Not on file   Years of education: Not on file   Highest education level: Not on file  Occupational History   Not on file  Tobacco Use   Smoking status: Never   Smokeless tobacco: Never  Vaping Use   Vaping status: Never Used  Substance and Sexual Activity   Alcohol use: No   Drug use: No   Sexual activity: Not on file  Other Topics Concern   Not on file  Social History Narrative   Pt lives in [place] with spouse.  Works as a Surveyor, [employer].   Social Determinants of Health   Financial Resource Strain: Low Risk  (06/27/2022)   Received from [facility], [facility]   Overall Financial Resource Strain (CARDIA)    Difficulty of Paying Living Expenses: Not hard at all  Food Insecurity: No Food Insecurity (06/27/2022)   Received from [facility], [facility]   Hunger Vital Sign    Worried About Running Out of Food in the Last Year: Never true    Ran Out of Food in the Last Year: Never true  Transportation Needs: No Transportation Needs (06/27/2022)   Received from [facility], [facility]   [facility] - Transportation    In the past 12 months, has lack of transportation kept you from medical appointments or from getting medications?: No    Lack of Transportation (Non-Medical): No  Physical Activity: Not on file  Stress: Not on file  Social Connections: Not on file  Intimate  Partner Violence: Not on file    Review of Systems: See HPI, otherwise negative ROS  Physical Exam: Ht 5\' 10"  (1.778 m)   Wt 93 kg   BMI 29.41 kg/m  General:   Alert, cooperative in NAD Head:  Normocephalic and atraumatic. Respiratory:  Normal work of breathing. Cardiovascular:  RRR  Impression/Plan: Francisco Reed is here for cataract surgery.  Risks, benefits, limitations, and alternatives regarding cataract surgery have been reviewed with the patient.  Questions have been answered.  All parties agreeable.   Estanislado Pandy, MD  12/18/2022, 7:13 AM

## 2022-12-18 NOTE — Transfer of Care (Signed)
Immediate Anesthesia Transfer of Care Note  Patient: Francisco Reed  Procedure(s) Performed: CATARACT EXTRACTION PHACO AND INTRAOCULAR LENS PLACEMENT (IOC) RIGHT 5.39 00:32.8 (Right: Eye)  Patient Location: PACU  Anesthesia Type: MAC  Level of Consciousness: awake, alert  and patient cooperative  Airway and Oxygen Therapy: Patient Spontanous Breathing and Patient connected to supplemental oxygen  Post-op Assessment: Post-op Vital signs reviewed, Patient's Cardiovascular Status Stable, Respiratory Function Stable, Patent Airway and No signs of Nausea or vomiting  Post-op Vital Signs: Reviewed and stable  Complications: No notable events documented.

## 2022-12-19 ENCOUNTER — Encounter: Payer: Self-pay | Admitting: Ophthalmology

## 2022-12-22 ENCOUNTER — Emergency Department (HOSPITAL_COMMUNITY): Payer: BC Managed Care – PPO

## 2022-12-22 ENCOUNTER — Telehealth (HOSPITAL_COMMUNITY): Payer: Self-pay

## 2022-12-22 ENCOUNTER — Other Ambulatory Visit: Payer: Self-pay

## 2022-12-22 ENCOUNTER — Emergency Department (HOSPITAL_COMMUNITY)
Admission: EM | Admit: 2022-12-22 | Discharge: 2022-12-22 | Disposition: A | Discharge status: Home / Self Care | Payer: BC Managed Care – PPO | Attending: Emergency Medicine | Admitting: Emergency Medicine

## 2022-12-22 DIAGNOSIS — I4891 Unspecified atrial fibrillation: Secondary | ICD-10-CM | POA: Diagnosis not present

## 2022-12-22 DIAGNOSIS — R079 Chest pain, unspecified: Secondary | ICD-10-CM | POA: Diagnosis not present

## 2022-12-22 DIAGNOSIS — Z7901 Long term (current) use of anticoagulants: Secondary | ICD-10-CM | POA: Insufficient documentation

## 2022-12-22 DIAGNOSIS — R531 Weakness: Secondary | ICD-10-CM | POA: Diagnosis not present

## 2022-12-22 DIAGNOSIS — Z0189 Encounter for other specified special examinations: Secondary | ICD-10-CM

## 2022-12-22 LAB — BASIC METABOLIC PANEL
Anion gap: 11 (ref 5–15)
BUN: 15 mg/dL (ref 8–23)
CO2: 25 mmol/L (ref 22–32)
Calcium: 9.7 mg/dL (ref 8.9–10.3)
Chloride: 101 mmol/L (ref 98–111)
Creatinine, Ser: 0.8 mg/dL (ref 0.61–1.24)
GFR, Estimated: >60 mL/min (ref >60)
Glucose, Bld: 123 mg/dL — ABNORMAL HIGH (ref 70–99)
Potassium: 4.2 mmol/L (ref 3.5–5.1)
Sodium: 137 mmol/L (ref 135–145)

## 2022-12-22 LAB — CBC
HCT: 45.6 % (ref 39.0–52.0)
Hemoglobin: 14.9 g/dL (ref 13.0–17.0)
MCH: 30.4 pg (ref 26.0–34.0)
MCHC: 32.7 g/dL (ref 30.0–36.0)
MCV: 93.1 fL (ref 80.0–100.0)
Platelets: 211 10*3/uL (ref 150–400)
RBC: 4.9 MIL/uL (ref 4.22–5.81)
RDW: 12.4 % (ref 11.5–15.5)
WBC: 5.4 10*3/uL (ref 4.0–10.5)
nRBC: 0 % (ref 0.0–0.2)

## 2022-12-22 LAB — TROPONIN I (HIGH SENSITIVITY)
Troponin I (High Sensitivity): 7 ng/L (ref <18)
Troponin I (High Sensitivity): 7 ng/L (ref <18)

## 2022-12-22 MED ORDER — PROPOFOL 10 MG/ML IV BOLUS
INTRAVENOUS | Status: AC | PRN
  Administered 2022-12-22: 20 mg via INTRAVENOUS
  Administered 2022-12-22: 50 mg via INTRAVENOUS

## 2022-12-22 NOTE — ED Notes (Signed)
Zoll pads placed on patient.  

## 2022-12-22 NOTE — ED Notes (Signed)
Awaiting patient from lobby 

## 2022-12-22 NOTE — ED Notes (Signed)
RN is aware pt of heart rhythm.

## 2022-12-22 NOTE — Telephone Encounter (Signed)
Patient called in with A-fib. Symptoms started today. He is feeling real weak. Patient states his HR is all over the place. Ranging from 59-89. A few HR readings 161-171. B/P-134/90. Per Landry Mellow - PA instruct patient to come to the ER to be evaluated. Consulted with patient and he verbalized understanding.

## 2022-12-22 NOTE — ED Provider Notes (Addendum)
[place] EMERGENCY DEPARTMENT AT [facility] Provider Note   CSN: [id number] Arrival date & time: 12/22/22  1119     History  Chief Complaint  Patient presents with   Weakness    Francisco Reed is a 62 y.o. male.  HPI 62 year old male history of A-fib presents today with rapid heartbeat.  Patient states he has been well for several years.  Has been taking his medications as prescribed including flecainide and Eliquis.  Took his Eliquis today.  He was in his normal state of health yesterday and felt well.  Today he woke up and felt generally weak.  Has had the symptoms previously with A-fib with RVR.  He denies any chest pain, dyspnea, fever, chills.  He recently had a pickleball injury to his right calf and has been taking Ultram and muscle relaxants.  He also recently had surgery to the right eye cataract removal last week.     Home Medications Prior to Admission medications   Medication Sig Start Date End Date Taking? Authorizing Provider  acetaminophen (TYLENOL) 500 MG tablet Take 1,000 mg by mouth as needed for mild pain (pain score 1-3) or moderate pain (pain score 4-6).   Yes [provider]  apixaban (ELIQUIS) 5 MG TABS tablet TAKE 1 TABLET (5 MG TOTAL) 2 (TWO) TIMES DAILY BY MOUTH.  Overdue for follow-up and bloodwork. MUST see MD for future refills. 01/10/20  Yes Allred, Fayrene Fearing, MD  ascorbic acid (VITAMIN C) 1000 MG tablet Take 2 tablets by mouth daily.   Yes [provider]  Biotin 10 MG CAPS Take 1 capsule by mouth every morning.   Yes [provider]  finasteride (PROPECIA) 1 MG tablet Take 1 tablet by mouth daily. 04/18/20  Yes [provider]  flecainide (TAMBOCOR) 50 MG tablet TAKE 1 TABLET BY MOUTH TWICE A DAY 01/24/22  Yes Lanier Prude, MD  tadalafil (CIALIS) 5 MG tablet Take 1 tablet (5 mg total) by mouth daily as needed for erectile dysfunction. 08/05/18  Yes Allred, Fayrene Fearing, MD  Turmeric (QC TUMERIC COMPLEX PO) Take  1 capsule by mouth daily.   Yes [provider]  Cholecalciferol (VITAMIN D-3 PO) Take 1 tablet by mouth every morning.    [provider]      Allergies    Patient has no known allergies.    Review of Systems   Review of Systems  Physical Exam Updated Vital Signs BP (!) 134/93 (BP Location: Right Arm)   Pulse 69   Temp 97.9 F (36.6 C) (Oral)   Resp (!) 21   Ht 1.778 m (5\' 10" )   Wt 93 kg   SpO2 100%   BMI 29.41 kg/m  Physical Exam Vitals reviewed.  HENT:     Head: Normocephalic.     Right Ear: External ear normal.     Left Ear: External ear normal.     Nose: Nose normal.     Mouth/Throat:     Pharynx: Oropharynx is clear.  Cardiovascular:     Rate and Rhythm: Tachycardia present. Rhythm irregular.     Pulses: Normal pulses.  Pulmonary:     Effort: Pulmonary effort is normal.  Abdominal:     General: Abdomen is flat. Bowel sounds are normal.  Musculoskeletal:        General: Normal range of motion.     Cervical back: Normal range of motion.  Skin:    Capillary Refill: Capillary refill takes less than 2 seconds.  Neurological:     Mental Status: He is alert.  Psychiatric:        Mood and Affect: Mood normal.     ED Results / Procedures / Treatments   Labs (all labs ordered are listed, but only abnormal results are displayed) Labs Reviewed  BASIC METABOLIC PANEL - Abnormal; Notable for the following components:      Result Value   Glucose, Bld 123 (*)    All other components within normal limits  CBC  TROPONIN I (HIGH SENSITIVITY)  TROPONIN I (HIGH SENSITIVITY)    EKG None  Radiology No results found.  Procedures .Cardioversion  Date/Time: 12/22/2022 2:50 PM  Performed by: Margarita Grizzle, MD Authorized by: Margarita Grizzle, MD   Consent:    Consent obtained:  Written   Consent given by:  Patient   Risks discussed:  Cutaneous burn, induced arrhythmia, death and pain   Alternatives discussed:  Rate-control medication Universal  protocol:    Immediately prior to procedure a time out was called: yes     Patient identity confirmed:  Verbally with patient and arm band Pre-procedure details:    Cardioversion basis:  Emergent   Rhythm:  Atrial fibrillation   Electrode placement:  Anterior-posterior Patient sedated: Yes. Refer to sedation procedure documentation for details of sedation.  Attempt one:    Cardioversion mode:  Synchronous   Waveform:  Biphasic   Shock (Joules):  200   Shock outcome:  Conversion to normal sinus rhythm Post-procedure details:    Patient status:  Alert   Patient tolerance of procedure:  Tolerated with difficulty .Sedation  Date/Time: 12/22/2022 2:51 PM  Performed by: Margarita Grizzle, MD Authorized by: Margarita Grizzle, MD   Consent:    Consent obtained:  Written   Consent given by:  Patient   Risks discussed:  Allergic reaction, prolonged sedation necessitating reversal and inadequate sedation Universal protocol:    Immediately prior to procedure, a time out was called: yes     Patient identity confirmed:  Arm band and verbally with patient Indications:    Procedure performed:  Cardioversion   Procedure necessitating sedation performed by:  Physician performing sedation Pre-sedation assessment:    Time since last food or drink:  5   NPO status caution: unable to specify NPO status     ASA classification: class 2 - patient with mild systemic disease     Mouth opening:  3 or more finger widths   Thyromental distance:  3 finger widths   Mallampati score:  II - soft palate, uvula, fauces visible   Neck mobility: normal     Pre-sedation assessments completed and reviewed: airway patency, mental status and respiratory function     Pre-sedation assessment completed:  12/22/2022 2:30 PM Immediate pre-procedure details:    Reassessment: Patient reassessed immediately prior to procedure     Reviewed: vital signs, relevant labs/tests and NPO status     Verified: bag valve mask available,  emergency equipment available, intubation equipment available, IV patency confirmed, oxygen available and suction available   Procedure details (see MAR for exact dosages):    Preoxygenation:  Nasal cannula   Sedation:  Propofol   Intended level of sedation: deep   Analgesia:  None   Intra-procedure monitoring:  Blood pressure monitoring, continuous capnometry, frequent vital sign checks, continuous pulse oximetry and cardiac monitor   Intra-procedure events: none     Total Provider sedation time (minutes):  45 Post-procedure details:    Post-sedation assessment completed:  12/22/2022 2:53 PM  Attendance: Constant attendance by certified staff until patient recovered     Recovery: Patient returned to pre-procedure baseline     Post-sedation assessments completed and reviewed: airway patency     Patient is stable for discharge or admission: yes     Procedure completion:  Tolerated well, no immediate complications .Critical Care  Performed by: Margarita Grizzle, MD Authorized by: Margarita Grizzle, MD   Critical care provider statement:    Critical care time (minutes):  45   Critical care was necessary to treat or prevent imminent or life-threatening deterioration of the following conditions:  Cardiac failure     Medications Ordered in ED Medications  propofol (DIPRIVAN) 10 mg/mL bolus/IV push (20 mg Intravenous Given 12/22/22 1441)    ED Course/ Medical Decision Making/ A&P                                 Medical Decision Making Amount and/or Complexity of Data Reviewed Labs: ordered. Radiology: ordered.   62 year old male history of A-fib presents today with new onset of A-fib for several years.  Patient is taking Eliquis and took his Eliquis this morning.  Patient has a history of recent eye surgery.  I did discuss with his ophthalmologist who states that he is fine with the patient proceeding with cardioversion. CHA2DS2/VAS Stroke Risk Points  Current as of about an hour ago     0  >= 2 Points: High Risk  1 to 1.99 Points: Medium Risk  0 Points: Low Risk    Last Change: N/A      Details    This score determines the patient's risk of having a stroke if the  patient has atrial fibrillation.       Points Metrics  0 Has Congestive Heart Failure:  No    Current as of about an hour ago  0 Has Vascular Disease:  No    Current as of about an hour ago  0 Has Hypertension:  No    Current as of about an hour ago  0 Age:  54    Current as of about an hour ago  0 Has Diabetes:  No    Current as of about an hour ago  0 Had Stroke:  No  Had TIA:  No  Had Thromboembolism:  No    Current as of about an hour ago  0 Male:  No    Current as of about an hour ago           This labs were reviewed and normal EKG showed A-fib with RVR Chest x-Shaynna Husby reviewed and normal Considered multiple etiologies including his known A-fib with RVR SVT and other arrhythmias. We considered rate control medications versus cardioversion. Discussed risks and benefits of cardioversion and sedation.  Patient and wife were at bedside and agreed to proceed proceed with cardioversion Cardioversion performed at 200 mg with conversion to normal limb Patient has been maintained on monitor and has been stable. Patient advised regarding return precautions and need for follow-up and voices understanding.       Final Clinical Impression(s) / ED Diagnoses Final diagnoses:  Atrial fibrillation with RVR (HCC)  Encounter for cardioversion procedure    Rx / DC Orders ED Discharge Orders     None         Margarita Grizzle, MD 12/22/22 1518    Margarita Grizzle, MD 01/05/23 1622

## 2022-12-22 NOTE — ED Triage Notes (Signed)
Patient reports generalized weakness and chest tightness this morning, making him think he had gone into afib. On Eliquis. HR 177 in triage.

## 2022-12-22 NOTE — ED Notes (Signed)
200 Joules used for cardioversion

## 2022-12-22 NOTE — Discharge Instructions (Addendum)
Please continue your home medications The atrial fib clinic should call you for an appointment Please call them if you do not hear from them Return if worse at any time

## 2022-12-22 NOTE — ED Notes (Signed)
Consents signed in chart

## 2022-12-25 DIAGNOSIS — M5459 Other low back pain: Secondary | ICD-10-CM | POA: Diagnosis not present

## 2022-12-25 DIAGNOSIS — M79661 Pain in right lower leg: Secondary | ICD-10-CM | POA: Diagnosis not present

## 2022-12-25 DIAGNOSIS — M6281 Muscle weakness (generalized): Secondary | ICD-10-CM | POA: Diagnosis not present

## 2022-12-26 ENCOUNTER — Ambulatory Visit (HOSPITAL_COMMUNITY)
Admission: RE | Admit: 2022-12-26 | Discharge: 2022-12-26 | Disposition: A | Discharge status: Home / Self Care | Payer: BC Managed Care – PPO | Source: Ambulatory Visit | Attending: Internal Medicine | Admitting: Internal Medicine

## 2022-12-26 VITALS — BP 144/80 | HR 62 | Ht 70.0 in | Wt 211.0 lb

## 2022-12-26 DIAGNOSIS — I4891 Unspecified atrial fibrillation: Secondary | ICD-10-CM | POA: Diagnosis not present

## 2022-12-26 DIAGNOSIS — R001 Bradycardia, unspecified: Secondary | ICD-10-CM | POA: Diagnosis not present

## 2022-12-26 DIAGNOSIS — Z79899 Other long term (current) drug therapy: Secondary | ICD-10-CM | POA: Insufficient documentation

## 2022-12-26 DIAGNOSIS — Z7901 Long term (current) use of anticoagulants: Secondary | ICD-10-CM | POA: Insufficient documentation

## 2022-12-26 DIAGNOSIS — I4819 Other persistent atrial fibrillation: Secondary | ICD-10-CM | POA: Diagnosis not present

## 2022-12-26 DIAGNOSIS — G4733 Obstructive sleep apnea (adult) (pediatric): Secondary | ICD-10-CM | POA: Diagnosis not present

## 2022-12-26 DIAGNOSIS — I959 Hypotension, unspecified: Secondary | ICD-10-CM | POA: Insufficient documentation

## 2022-12-26 NOTE — Progress Notes (Addendum)
Primary Care Physician: Marguarite Arbour, MD Referring Physician:Dr. Johney Frame   Francisco Reed is a 62 y.o. male with a h/o persistent afib s/p 2 ablations, with return of afib. He was placed on flecainide 50 mg bid, returned to prior BB dose and restarted eliquis. He self converted on flecainide after about 4 days on drug. He is back in the office and continues in SR. He feels well. He wants to continue on all drugs that he is currently on. Recent cardiac cath showed nonobstructive disease with 35% stenosis of LAD and with normal EF by cardiac cath.    On follow up 07/15/22, he is currently in NSR. He has not had any episodes of Afib recently. He has a history of 2 ablations (last in 05/2017) and multiple DCCV's. He is currently on flecainide and Eliquis 5 mg BID. He currently does not meet guidelines for anticoagulation but wishes to continue Eliquis; his father had a stroke because he stopped his blood thinner and he wishes to prevent this.   On follow up 12/26/22, he is currently in NSR. Recent ED visit on 12/22/22 for Afib with RVR (HR 170s) and underwent successful DCCV in the ED. He has felt well since then. He is currently taking flecainide 50 mg BID and Eliquis 5 mg BID.   Today, he denies symptoms of palpitations, chest pain, shortness of breath, orthopnea, PND, lower extremity edema, dizziness, presyncope, syncope, or neurologic sequela. The patient is tolerating medications without difficulties and is otherwise without complaint today.     Past Medical History:  Diagnosis Date   Arthritis    hands   Chronic systolic dysfunction of left ventricle    Obstructive sleep apnea    mild   Persistent atrial fibrillation (HCC)    Wears hearing aid in both ears    12/11/22.  Currently non-functional   Past Surgical History:  Procedure Laterality Date   ATRIAL FIBRILLATION ABLATION N/A 05/29/2017   Procedure: ATRIAL FIBRILLATION ABLATION;  Surgeon: Hillis Range, MD;  Location: [facility]  INVASIVE CV LAB;  Service: Cardiovascular;  Laterality: N/A;   CARDIAC CATHETERIZATION     CARDIOVERSION     CARDIOVERSION N/A 12/24/2016   Procedure: CARDIOVERSION;  Surgeon: Wendall Stade, MD;  Location: [facility] ENDOSCOPY;  Service: Cardiovascular;  Laterality: N/A;   CARDIOVERSION N/A 03/20/2017   Procedure: CARDIOVERSION;  Surgeon: Pricilla Riffle, MD;  Location: [facility] ENDOSCOPY;  Service: Cardiovascular;  Laterality: N/A;   CATARACT EXTRACTION W/PHACO Right 12/18/2022   Procedure: CATARACT EXTRACTION PHACO AND INTRAOCULAR LENS PLACEMENT (IOC) RIGHT 5.39 00:32.8;  Surgeon: Estanislado Pandy, MD;  Location: [facility] SURGERY CNTR;  Service: Ophthalmology;  Laterality: Right;   COLONOSCOPY WITH PROPOFOL N/A 12/10/2018   Procedure: COLONOSCOPY WITH PROPOFOL;  Surgeon: Wyline Mood, MD;  Location: [facility] ENDOSCOPY;  Service: Gastroenterology;  Laterality: N/A;   ELECTROPHYSIOLOGIC STUDY N/A 12/28/2014   Procedure: CARDIOVERSION;  Surgeon: Lamar Blinks, MD;  Location: [facility] ORS;  Service: Cardiovascular;  Laterality: N/A;   ELECTROPHYSIOLOGIC STUDY N/A 09/07/2015   Procedure: CARDIOVERSION;  Surgeon: Lamar Blinks, MD;  Location: [facility] ORS;  Service: Cardiovascular;  Laterality: N/A;   ELECTROPHYSIOLOGIC STUDY N/A 03/18/2016   Procedure: Atrial Fibrillation Ablation;  Surgeon: Hillis Range, MD;  Location: [facility] INVASIVE CV LAB;  Service: Cardiovascular;  Laterality: N/A;   RIGHT/LEFT HEART CATH AND CORONARY ANGIOGRAPHY N/A 08/06/2017   Procedure: RIGHT/LEFT HEART CATH AND CORONARY ANGIOGRAPHY;  Surgeon: Swaziland, Peter M, MD;  Location: [facility] INVASIVE CV LAB;  Service:  Cardiovascular;  Laterality: N/A;   TEE WITHOUT CARDIOVERSION N/A 09/07/2015   Procedure: TRANSESOPHAGEAL ECHOCARDIOGRAM (TEE);  Surgeon: Lamar Blinks, MD;  Location: [facility] ORS;  Service: Cardiovascular;  Laterality: N/A;   TEE WITHOUT CARDIOVERSION N/A 03/17/2016   Procedure: TRANSESOPHAGEAL ECHOCARDIOGRAM (TEE);  Surgeon: Lewayne Bunting, MD;   Location: [facility] ENDOSCOPY;  Service: Cardiovascular;  Laterality: N/A;   TEE WITHOUT CARDIOVERSION N/A 12/24/2016   Procedure: TRANSESOPHAGEAL ECHOCARDIOGRAM (TEE);  Surgeon: Wendall Stade, MD;  Location: [facility] ENDOSCOPY;  Service: Cardiovascular;  Laterality: N/A;   TEE WITHOUT CARDIOVERSION N/A 05/29/2017   Procedure: TRANSESOPHAGEAL ECHOCARDIOGRAM (TEE);  Surgeon: Lewayne Bunting, MD;  Location: [facility] ENDOSCOPY;  Service: Cardiovascular;  Laterality: N/A;    Current Outpatient Medications  Medication Sig Dispense Refill   acetaminophen (TYLENOL) 500 MG tablet Take 1,000 mg by mouth as needed for mild pain (pain score 1-3) or moderate pain (pain score 4-6).     apixaban (ELIQUIS) 5 MG TABS tablet TAKE 1 TABLET (5 MG TOTAL) 2 (TWO) TIMES DAILY BY MOUTH.  Overdue for follow-up and bloodwork. MUST see MD for future refills. 60 tablet 1   ascorbic acid (VITAMIN C) 1000 MG tablet Take 2 tablets by mouth daily.     Biotin 10 MG CAPS Take 1 capsule by mouth every morning.     Cholecalciferol (VITAMIN D-3 PO) Take 1 tablet by mouth every morning.     finasteride (PROPECIA) 1 MG tablet Take 1 tablet by mouth daily.     flecainide (TAMBOCOR) 50 MG tablet TAKE 1 TABLET BY MOUTH TWICE A DAY 60 tablet 0   tadalafil (CIALIS) 5 MG tablet Take 1 tablet (5 mg total) by mouth daily as needed for erectile dysfunction. 10 tablet 3   Turmeric (QC TUMERIC COMPLEX PO) Take 1 capsule by mouth daily.     No current facility-administered medications for this encounter.    No Known Allergies  ROS- All systems are reviewed and negative except as per the HPI above  Physical Exam: Vitals:   12/26/22 0823  BP: (!) 144/80  Pulse: 62  Weight: 95.7 kg  Height: 5\' 10"  (1.778 m)    Wt Readings from Last 3 Encounters:  12/26/22 95.7 kg  12/22/22 93 kg  12/18/22 95.3 kg    Labs: Lab Results  Component Value Date   NA 137 12/22/2022   K 4.2 12/22/2022   CL 101 12/22/2022   CO2 25 12/22/2022   GLUCOSE 123 (H)  12/22/2022   BUN 15 12/22/2022   CREATININE 0.80 12/22/2022   CALCIUM 9.7 12/22/2022   MG 1.8 09/06/2013   Lab Results  Component Value Date   INR 1.2 09/06/2013   No results found for: "CHOL", "HDL", "LDLCALC", "TRIG"  GEN- The patient is well appearing, alert and oriented x 3 today.   Neck - no JVD or carotid bruit noted Lungs- Clear to ausculation bilaterally, normal work of breathing Heart- Regular rate and rhythm, no murmurs, rubs or gallops, PMI not laterally displaced Extremities- no clubbing, cyanosis, or edema Skin - no rash or ecchymosis noted  EKG- Vent. rate 62 BPM PR interval 180 ms QRS duration 100 ms QT/QTcB 408/414 ms P-R-T axes 59 28 15 Normal sinus rhythm Normal ECG When compared with ECG of 22-Dec-2022 14:46, PREVIOUS ECG IS PRESENT  ECHO 03/11/21:  1. Left ventricular ejection fraction, by estimation, is 50 to 55%. The  left ventricle has low normal function. The left ventricle has no regional  wall motion abnormalities. The  left ventricular internal cavity size was  mildly dilated. Left ventricular  diastolic parameters were normal.   2. Right ventricular systolic function is normal. The right ventricular  size is normal.   3. The mitral valve is normal in structure. Mild mitral valve  regurgitation.   4. The aortic valve is normal in structure. Aortic valve regurgitation is  trivial.   5. Aortic dilatation noted. There is borderline dilatation of the aortic  root, measuring 38 mm. There is mild dilatation of the ascending aorta,  measuring 42 mm.    Assessment and Plan: 1. Persistent afib  He is currently in NSR.  Converted to SR on flecainide Recent ED visit on 11/4 s/p successful DCCV. He was taking a new whey protein that had a caffeine derivative and had also drank 2 diet mountain dew the day previous which could have all led to paroxysmal episode. He will avoid the whey protein supplement he purchased. We will not make changes at this time  and continue observation. Continue flecainide 50 mg bid  We discussed with him currently being on flecainide would like him to f/u every 6 months. He is currently not on an AV nodal agent due to flecainide keeping him bradycardic. He was on metoprolol historically but it caused hypotension. With regards to heart cath history, if he has progression of mild nonobstructive CAD in the future would have to discontinue flecainide and revisit treatment options.   2. CHA2DS2VASc score of 0 Pt prefers to stay on eliquis 5 mg bid for now despite understanding guidelines do not recommend given his Chads score.   F/u 6 months Afib clinic   Lake Bells, PA-C Af[facility] [address] [place], [place] [address] [phone]

## 2022-12-29 DIAGNOSIS — M5459 Other low back pain: Secondary | ICD-10-CM | POA: Diagnosis not present

## 2022-12-29 DIAGNOSIS — M6281 Muscle weakness (generalized): Secondary | ICD-10-CM | POA: Diagnosis not present

## 2022-12-29 DIAGNOSIS — M79661 Pain in right lower leg: Secondary | ICD-10-CM | POA: Diagnosis not present

## 2022-12-31 DIAGNOSIS — M5459 Other low back pain: Secondary | ICD-10-CM | POA: Diagnosis not present

## 2022-12-31 DIAGNOSIS — M79661 Pain in right lower leg: Secondary | ICD-10-CM | POA: Diagnosis not present

## 2022-12-31 DIAGNOSIS — H903 Sensorineural hearing loss, bilateral: Secondary | ICD-10-CM | POA: Diagnosis not present

## 2022-12-31 DIAGNOSIS — M6281 Muscle weakness (generalized): Secondary | ICD-10-CM | POA: Diagnosis not present

## 2023-01-02 DIAGNOSIS — M79661 Pain in right lower leg: Secondary | ICD-10-CM | POA: Diagnosis not present

## 2023-01-02 DIAGNOSIS — M6281 Muscle weakness (generalized): Secondary | ICD-10-CM | POA: Diagnosis not present

## 2023-01-02 DIAGNOSIS — M5459 Other low back pain: Secondary | ICD-10-CM | POA: Diagnosis not present

## 2023-01-07 DIAGNOSIS — M6281 Muscle weakness (generalized): Secondary | ICD-10-CM | POA: Diagnosis not present

## 2023-01-07 DIAGNOSIS — M5459 Other low back pain: Secondary | ICD-10-CM | POA: Diagnosis not present

## 2023-01-07 DIAGNOSIS — M79661 Pain in right lower leg: Secondary | ICD-10-CM | POA: Diagnosis not present

## 2023-01-12 DIAGNOSIS — M6281 Muscle weakness (generalized): Secondary | ICD-10-CM | POA: Diagnosis not present

## 2023-01-12 DIAGNOSIS — M5459 Other low back pain: Secondary | ICD-10-CM | POA: Diagnosis not present

## 2023-01-12 DIAGNOSIS — M79661 Pain in right lower leg: Secondary | ICD-10-CM | POA: Diagnosis not present

## 2023-01-14 DIAGNOSIS — M79661 Pain in right lower leg: Secondary | ICD-10-CM | POA: Diagnosis not present

## 2023-01-14 DIAGNOSIS — M6281 Muscle weakness (generalized): Secondary | ICD-10-CM | POA: Diagnosis not present

## 2023-01-14 DIAGNOSIS — M5459 Other low back pain: Secondary | ICD-10-CM | POA: Diagnosis not present

## 2023-01-19 DIAGNOSIS — M5459 Other low back pain: Secondary | ICD-10-CM | POA: Diagnosis not present

## 2023-01-19 DIAGNOSIS — M79661 Pain in right lower leg: Secondary | ICD-10-CM | POA: Diagnosis not present

## 2023-01-19 DIAGNOSIS — M6281 Muscle weakness (generalized): Secondary | ICD-10-CM | POA: Diagnosis not present

## 2023-01-22 ENCOUNTER — Ambulatory Visit (HOSPITAL_COMMUNITY): Payer: BC Managed Care – PPO | Admitting: Internal Medicine

## 2023-01-30 DIAGNOSIS — D2261 Melanocytic nevi of right upper limb, including shoulder: Secondary | ICD-10-CM | POA: Diagnosis not present

## 2023-01-30 DIAGNOSIS — D2272 Melanocytic nevi of left lower limb, including hip: Secondary | ICD-10-CM | POA: Diagnosis not present

## 2023-01-30 DIAGNOSIS — D225 Melanocytic nevi of trunk: Secondary | ICD-10-CM | POA: Diagnosis not present

## 2023-01-30 DIAGNOSIS — D2262 Melanocytic nevi of left upper limb, including shoulder: Secondary | ICD-10-CM | POA: Diagnosis not present

## 2023-07-31 ENCOUNTER — Ambulatory Visit (HOSPITAL_COMMUNITY)
Admission: RE | Admit: 2023-07-31 | Discharge: 2023-07-31 | Disposition: A | Discharge status: Home / Self Care | Payer: BC Managed Care – PPO | Source: Ambulatory Visit | Attending: Internal Medicine | Admitting: Internal Medicine

## 2023-07-31 VITALS — BP 132/86 | HR 57 | Ht 70.0 in | Wt 207.6 lb

## 2023-07-31 DIAGNOSIS — I4891 Unspecified atrial fibrillation: Secondary | ICD-10-CM

## 2023-07-31 DIAGNOSIS — I4819 Other persistent atrial fibrillation: Secondary | ICD-10-CM | POA: Diagnosis not present

## 2023-07-31 NOTE — Patient Instructions (Signed)
 Stop flecainide    [facility] 1 lead

## 2023-07-31 NOTE — Progress Notes (Addendum)
 Primary Care Physician: Yehuda Helms, MD Referring Physician:Dr. Nunzio Belch   Francisco Reed is a 63 y.o. male with a h/o persistent afib s/p 2 ablations, with return of afib. He was placed on flecainide  50 mg bid, returned to prior BB dose and restarted eliquis . He self converted on flecainide  after about 4 days on drug. He is back in the office and continues in SR. He feels well. He wants to continue on all drugs that he is currently on. Recent cardiac cath showed nonobstructive disease with 35% stenosis of LAD and with normal EF by cardiac cath.    On follow up 07/15/22, he is currently in NSR. He has not had any episodes of Afib recently. He has a history of 2 ablations (last in 05/2017) and multiple DCCV's. He is currently on flecainide  and Eliquis  5 mg BID. He currently does not meet guidelines for anticoagulation but wishes to continue Eliquis ; his father had a stroke because he stopped his blood thinner and he wishes to prevent this.   On follow up 12/26/22, he is currently in NSR. Recent ED visit on 12/22/22 for Afib with RVR (HR 170s) and underwent successful DCCV in the ED. He has felt well since then. He is currently taking flecainide  50 mg BID and Eliquis  5 mg BID.   On follow up 07/31/23, patient is here for flecainide  surveillance. He is currently in NSR. He has had no episodes of Afib since November. He takes flecainide  50 mg BID.   Today, he denies symptoms of palpitations, chest pain, shortness of breath, orthopnea, PND, lower extremity edema, dizziness, presyncope, syncope, or neurologic sequela. The patient is tolerating medications without difficulties and is otherwise without complaint today.     Past Medical History:  Diagnosis Date   Arthritis    hands   Chronic systolic dysfunction of left ventricle    Obstructive sleep apnea    mild   Persistent atrial fibrillation (HCC)    Wears hearing aid in both ears    12/11/22.  Currently non-functional   Past Surgical  History:  Procedure Laterality Date   ATRIAL FIBRILLATION ABLATION N/A 05/29/2017   Procedure: ATRIAL FIBRILLATION ABLATION;  Surgeon: Jolly Needle, MD;  Location: [facility] INVASIVE CV LAB;  Service: Cardiovascular;  Laterality: N/A;   CARDIAC CATHETERIZATION     CARDIOVERSION     CARDIOVERSION N/A 12/24/2016   Procedure: CARDIOVERSION;  Surgeon: Loyde Rule, MD;  Location: [facility] ENDOSCOPY;  Service: Cardiovascular;  Laterality: N/A;   CARDIOVERSION N/A 03/20/2017   Procedure: CARDIOVERSION;  Surgeon: Elmyra Haggard, MD;  Location: [facility] ENDOSCOPY;  Service: Cardiovascular;  Laterality: N/A;   CATARACT EXTRACTION W/PHACO Right 12/18/2022   Procedure: CATARACT EXTRACTION PHACO AND INTRAOCULAR LENS PLACEMENT (IOC) RIGHT 5.39 00:32.8;  Surgeon: Trudi Fus, MD;  Location: [facility] SURGERY CNTR;  Service: Ophthalmology;  Laterality: Right;   COLONOSCOPY WITH PROPOFOL  N/A 12/10/2018   Procedure: COLONOSCOPY WITH PROPOFOL ;  Surgeon: Luke Salaam, MD;  Location: [facility] ENDOSCOPY;  Service: Gastroenterology;  Laterality: N/A;   ELECTROPHYSIOLOGIC STUDY N/A 12/28/2014   Procedure: CARDIOVERSION;  Surgeon: Michelle Aid, MD;  Location: [facility] ORS;  Service: Cardiovascular;  Laterality: N/A;   ELECTROPHYSIOLOGIC STUDY N/A 09/07/2015   Procedure: CARDIOVERSION;  Surgeon: Michelle Aid, MD;  Location: [facility] ORS;  Service: Cardiovascular;  Laterality: N/A;   ELECTROPHYSIOLOGIC STUDY N/A 03/18/2016   Procedure: Atrial Fibrillation Ablation;  Surgeon: Jolly Needle, MD;  Location: [facility] INVASIVE CV LAB;  Service: Cardiovascular;  Laterality: N/A;  RIGHT/LEFT HEART CATH AND CORONARY ANGIOGRAPHY N/A 08/06/2017   Procedure: RIGHT/LEFT HEART CATH AND CORONARY ANGIOGRAPHY;  Surgeon: Swaziland, Peter M, MD;  Location: [facility] INVASIVE CV LAB;  Service: Cardiovascular;  Laterality: N/A;   TEE WITHOUT CARDIOVERSION N/A 09/07/2015   Procedure: TRANSESOPHAGEAL ECHOCARDIOGRAM (TEE);  Surgeon: Michelle Aid, MD;  Location: [facility] ORS;   Service: Cardiovascular;  Laterality: N/A;   TEE WITHOUT CARDIOVERSION N/A 03/17/2016   Procedure: TRANSESOPHAGEAL ECHOCARDIOGRAM (TEE);  Surgeon: Lenise Quince, MD;  Location: [facility] ENDOSCOPY;  Service: Cardiovascular;  Laterality: N/A;   TEE WITHOUT CARDIOVERSION N/A 12/24/2016   Procedure: TRANSESOPHAGEAL ECHOCARDIOGRAM (TEE);  Surgeon: Loyde Rule, MD;  Location: [facility] ENDOSCOPY;  Service: Cardiovascular;  Laterality: N/A;   TEE WITHOUT CARDIOVERSION N/A 05/29/2017   Procedure: TRANSESOPHAGEAL ECHOCARDIOGRAM (TEE);  Surgeon: Lenise Quince, MD;  Location: [facility] ENDOSCOPY;  Service: Cardiovascular;  Laterality: N/A;    Current Outpatient Medications  Medication Sig Dispense Refill   acetaminophen  (TYLENOL ) 500 MG tablet Take 1,000 mg by mouth as needed for mild pain (pain score 1-3) or moderate pain (pain score 4-6).     apixaban  (ELIQUIS ) 5 MG TABS tablet TAKE 1 TABLET (5 MG TOTAL) 2 (TWO) TIMES DAILY BY MOUTH.  Overdue for follow-up and bloodwork. MUST see MD for future refills. 60 tablet 1   ascorbic acid (VITAMIN C) 1000 MG tablet Take 2 tablets by mouth daily.     Biotin 10 MG CAPS Take 1 capsule by mouth every morning.     Cholecalciferol (VITAMIN D-3 PO) Take 1 tablet by mouth every morning.     finasteride (PROPECIA) 1 MG tablet Take 1 tablet by mouth daily.     tadalafil  (CIALIS ) 5 MG tablet Take 1 tablet (5 mg total) by mouth daily as needed for erectile dysfunction. 10 tablet 3   Turmeric (QC TUMERIC COMPLEX PO) Take 1 capsule by mouth daily.     No current facility-administered medications for this encounter.    No Known Allergies  ROS- All systems are reviewed and negative except as per the HPI above  Physical Exam: Vitals:   07/31/23 0905  BP: 132/86  Pulse: (!) 57  Weight: 94.2 kg  Height: 5' 10 (1.778 m)     Wt Readings from Last 3 Encounters:  07/31/23 94.2 kg  12/26/22 95.7 kg  12/22/22 93 kg    Labs: Lab Results  Component Value Date   NA 137  12/22/2022   K 4.2 12/22/2022   CL 101 12/22/2022   CO2 25 12/22/2022   GLUCOSE 123 (H) 12/22/2022   BUN 15 12/22/2022   CREATININE 0.80 12/22/2022   CALCIUM 9.7 12/22/2022   MG 1.8 09/06/2013   Lab Results  Component Value Date   INR 1.2 09/06/2013   No results found for: CHOL, HDL, LDLCALC, TRIG  GEN- The patient is well appearing, alert and oriented x 3 today.   Neck - no JVD or carotid bruit noted Lungs- Clear to ausculation bilaterally, normal work of breathing Heart- Regular rate and rhythm, no murmurs, rubs or gallops, PMI not laterally displaced Extremities- no clubbing, cyanosis, or edema Skin - no rash or ecchymosis noted   EKG- Vent. rate 57 BPM PR interval 168 ms QRS duration 106 ms QT/QTcB 432/420 ms P-R-T axes 7 20 19  Sinus bradycardia Otherwise normal ECG When compared with ECG of 26-Dec-2022 08:33, No significant change was found  ECHO 03/11/21:  1. Left ventricular ejection fraction, by estimation, is 50 to 55%. The  left  ventricle has low normal function. The left ventricle has no regional  wall motion abnormalities. The left ventricular internal cavity size was  mildly dilated. Left ventricular  diastolic parameters were normal.   2. Right ventricular systolic function is normal. The right ventricular  size is normal.   3. The mitral valve is normal in structure. Mild mitral valve  regurgitation.   4. The aortic valve is normal in structure. Aortic valve regurgitation is  trivial.   5. Aortic dilatation noted. There is borderline dilatation of the aortic  root, measuring 38 mm. There is mild dilatation of the ascending aorta,  measuring 42 mm.    Assessment and Plan: 1. Persistent afib  He is currently in NSR. We had a discussion regarding patient's flecainide  and history of mild nonobstructive CAD. Patient notes no Afib since November and Dr. Nunzio Belch had mentioned coming off the flecainide  in the future. His brother had a stroke at age  31 and he is scared it runs in the family. He wishes to try stopping the flecainide  and will pick up Kardiamobile device to monitor rhythm. He will continue Eliquis . This plan is reasonable and he will contact us  should he note increased Afib burden.    Follow up 1 year Afib clinic.   Minnie Amber, PA-C Afib Clinic [facility] [address] [place], [place] [address] [phone]

## 2023-07-31 NOTE — Addendum Note (Signed)
 Encounter addended by: Nathanel Bal, PA-C on: 07/31/2023 10:35 AM  Actions taken: Clinical Note Signed

## 2023-08-14 ENCOUNTER — Encounter: Payer: Self-pay | Admitting: Emergency Medicine

## 2023-08-14 ENCOUNTER — Emergency Department

## 2023-08-14 ENCOUNTER — Emergency Department
Admission: EM | Admit: 2023-08-14 | Discharge: 2023-08-14 | Disposition: A | Discharge status: Home / Self Care | Attending: Emergency Medicine | Admitting: Emergency Medicine

## 2023-08-14 ENCOUNTER — Other Ambulatory Visit: Payer: Self-pay

## 2023-08-14 DIAGNOSIS — Z7901 Long term (current) use of anticoagulants: Secondary | ICD-10-CM | POA: Insufficient documentation

## 2023-08-14 DIAGNOSIS — M79605 Pain in left leg: Secondary | ICD-10-CM | POA: Diagnosis present

## 2023-08-14 DIAGNOSIS — T79A0XA Compartment syndrome, unspecified, initial encounter: Secondary | ICD-10-CM | POA: Diagnosis not present

## 2023-08-14 DIAGNOSIS — I509 Heart failure, unspecified: Secondary | ICD-10-CM | POA: Diagnosis not present

## 2023-08-14 DIAGNOSIS — S86812A Strain of other muscle(s) and tendon(s) at lower leg level, left leg, initial encounter: Secondary | ICD-10-CM | POA: Diagnosis not present

## 2023-08-14 DIAGNOSIS — S8012XA Contusion of left lower leg, initial encounter: Secondary | ICD-10-CM

## 2023-08-14 DIAGNOSIS — R2242 Localized swelling, mass and lump, left lower limb: Secondary | ICD-10-CM | POA: Insufficient documentation

## 2023-08-14 HISTORY — DX: Unspecified atrial fibrillation: I48.91

## 2023-08-14 LAB — CBC WITH DIFFERENTIAL/PLATELET
Abs Immature Granulocytes: 0.01 10*3/uL (ref 0.00–0.07)
Basophils Absolute: 0 10*3/uL (ref 0.0–0.1)
Basophils Relative: 1 %
Eosinophils Absolute: 0.2 10*3/uL (ref 0.0–0.5)
Eosinophils Relative: 3 %
HCT: 39.3 % (ref 39.0–52.0)
Hemoglobin: 13 g/dL (ref 13.0–17.0)
Immature Granulocytes: 0 %
Lymphocytes Relative: 30 %
Lymphs Abs: 1.7 10*3/uL (ref 0.7–4.0)
MCH: 30.2 pg (ref 26.0–34.0)
MCHC: 33.1 g/dL (ref 30.0–36.0)
MCV: 91.2 fL (ref 80.0–100.0)
Monocytes Absolute: 0.6 10*3/uL (ref 0.1–1.0)
Monocytes Relative: 10 %
Neutro Abs: 3.1 10*3/uL (ref 1.7–7.7)
Neutrophils Relative %: 56 %
Platelets: 183 10*3/uL (ref 150–400)
RBC: 4.31 MIL/uL (ref 4.22–5.81)
RDW: 12.2 % (ref 11.5–15.5)
WBC: 5.6 10*3/uL (ref 4.0–10.5)
nRBC: 0 % (ref 0.0–0.2)

## 2023-08-14 LAB — BASIC METABOLIC PANEL WITH GFR
Anion gap: 5 (ref 5–15)
BUN: 14 mg/dL (ref 8–23)
CO2: 24 mmol/L (ref 22–32)
Calcium: 9 mg/dL (ref 8.9–10.3)
Chloride: 107 mmol/L (ref 98–111)
Creatinine, Ser: 0.83 mg/dL (ref 0.61–1.24)
GFR, Estimated: >60 mL/min (ref >60)
Glucose, Bld: 96 mg/dL (ref 70–99)
Potassium: 4 mmol/L (ref 3.5–5.1)
Sodium: 136 mmol/L (ref 135–145)

## 2023-08-14 LAB — PROTIME-INR
INR: 1.3 — ABNORMAL HIGH (ref 0.8–1.2)
Prothrombin Time: 16.4 s — ABNORMAL HIGH (ref 11.4–15.2)

## 2023-08-14 LAB — CK: Total CK: 159 U/L (ref 49–397)

## 2023-08-14 LAB — APTT: aPTT: 35 s (ref 24–36)

## 2023-08-14 MED ORDER — OXYCODONE-ACETAMINOPHEN 5-325 MG PO TABS
1.0000 | ORAL_TABLET | Freq: Once | ORAL | Status: AC
  Administered 2023-08-14: 1 via ORAL
  Filled 2023-08-14: qty 1

## 2023-08-14 MED ORDER — OXYCODONE-ACETAMINOPHEN 5-325 MG PO TABS: 1.0000 | ORAL_TABLET | ORAL | 0 refills | Status: DC | PRN

## 2023-08-14 MED ORDER — IOHEXOL 350 MG/ML SOLN
125.0000 mL | Freq: Once | INTRAVENOUS | Status: AC | PRN
  Administered 2023-08-14: 125 mL via INTRAVENOUS

## 2023-08-14 MED ORDER — MORPHINE SULFATE (PF) 2 MG/ML IV SOLN
2.0000 mg | Freq: Once | INTRAVENOUS | Status: AC
  Administered 2023-08-14: 2 mg via INTRAVENOUS
  Filled 2023-08-14: qty 1

## 2023-08-14 NOTE — ED Provider Notes (Signed)
 [facility] Provider Note    Event Date/Time   First MD Initiated Contact with Patient 08/14/23 1315     (approximate)   History   Leg Pain   HPI  Francisco Reed is a 63 y.o. male history of A-fib on Eliquis , CHF, presenting with left calf swelling and pain since yesterday.  Patient was playing pickle ball, said he heard a pop and felt like something hit him.  Did not fall to the ground.  States that he noticed left leg swelling yesterday and some pain but that has worsened today.  He denies any weakness or numbness, is able to ambulate.  No chest pain or shortness of breath.  Went to Ortho urgent care who sent him here for evaluation.    Per independent history from wife, he did take 2 Advil's today.  Wife states that when he stands up, his left foot and calf becomes more red.    On independent chart review, he was seen by his cardiologist in mid June, does have history of A-fib, he was continued on his Eliquis , stopped his flecainide .  Physical Exam   Triage Vital Signs: ED Triage Vitals  Encounter Vitals Group     BP 08/14/23 1153 (!) 149/106     Girls Systolic BP Percentile --      Girls Diastolic BP Percentile --      Boys Systolic BP Percentile --      Boys Diastolic BP Percentile --      Pulse Rate 08/14/23 1153 68     Resp 08/14/23 1153 16     Temp 08/14/23 1153 98.9 F (37.2 C)     Temp Source 08/14/23 1153 Oral     SpO2 08/14/23 1153 98 %     Weight 08/14/23 1153 207 lb 3.7 oz (94 kg)     Height --      Head Circumference --      Peak Flow --      Pain Score 08/14/23 1152 10     Pain Loc --      Pain Education --      Exclude from Growth Chart --     Most recent vital signs: Vitals:   08/14/23 1153  BP: (!) 149/106  Pulse: 68  Resp: 16  Temp: 98.9 F (37.2 C)  SpO2: 98%     General: Awake, no distress.  CV:  Good peripheral perfusion.  Resp:  Normal effort.  Abd:  No distention.  Other:  Palpable DP and PT pulses  bilaterally, no focal weakness or numbness, his left calf is swollen, compartments are soft, no pain out of proportion, there is edema to the left calf.  It is tender posterior laterally.  There is no open wounds or erythema.  No obvious discoloration noted.   ED Results / Procedures / Treatments   Labs (all labs ordered are listed, but only abnormal results are displayed) Labs Reviewed  PROTIME-INR - Abnormal; Notable for the following components:      Result Value   Prothrombin Time 16.4 (*)    INR 1.3 (*)    All other components within normal limits  CBC WITH DIFFERENTIAL/PLATELET  BASIC METABOLIC PANEL WITH GFR  APTT  CK     RADIOLOGY On my independent interpretation, ultrasound without obvious DVT   PROCEDURES:  Critical Care performed: No  Procedures   MEDICATIONS ORDERED IN ED: Medications  morphine (PF) 2 MG/ML injection 2 mg (2 mg Intravenous  Given 08/14/23 1330)     IMPRESSION / MDM / ASSESSMENT AND PLAN / ED COURSE  I reviewed the triage vital signs and the nursing notes.                              Differential diagnosis includes, but is not limited to, DVT, muscle strain, tear, rhabdomyolysis, electrolyte derangements, he has good pulses, no coolness to the foot, less likely arterial dissection or thrombus at this time.  Considered compartment syndrome but he has no pain out of proportion, he has good pulses, his compartment is soft, this is less likely at this time but will reassess continuously.  Will get labs, DVT ultrasound was ordered in triage, will add on CT angio with and without contrast to better evaluate the tissue as well as vessels.  Patient's presentation is most consistent with acute presentation with potential threat to life or bodily function.  Independent interpretation of labs and imaging below.  On reassessment patient's pain is controlled, no pain or proportion, no discoloration to his legs, it is warm and well-perfused, DP pulses are  still intact bilaterally, no sensory deficits, no weakness, compartments are still soft.  Will continue to reassess pending his imaging results.  Patient signed out pending CT results.  The patient is on the cardiac monitor to evaluate for evidence of arrhythmia and/or significant heart rate changes.   Clinical Course as of 08/14/23 1513  Fri Aug 14, 2023  1408 Independent review of labs, CK is normal, electrolytes not severely deranged, no leukocytosis. [TT]  1456 US  Venous Img Lower Unilateral Left IMPRESSION: 1. No evidence of deep venous thrombosis. 2. Posterior calf complex hypoechoic area as above, nonspecific but favored to represent hematoma related to the recent injury.   [TT]    Clinical Course User Index [TT] Waymond, Lorelle Cummins, MD     FINAL CLINICAL IMPRESSION(S) / ED DIAGNOSES   Final diagnoses:  Left leg pain  Localized swelling of left lower extremity     Rx / DC Orders   ED Discharge Orders     None        Note:  This document was prepared using Dragon voice recognition software and may include unintentional dictation errors.    Waymond Lorelle Cummins, MD 08/14/23 [phone]

## 2023-08-14 NOTE — ED Triage Notes (Signed)
 Playing pickleball last night. Injured left calf. Seen through [facility] today and sent to ED for concerns of compartment syndrome.  Left calf swollen and painful.  Takes Eliquis  for hx of afib.  Ibuprofen taken for pain.

## 2023-08-14 NOTE — ED Provider Notes (Signed)
-----------------------------------------   3:08 PM on 08/14/2023 -----------------------------------------  Blood pressure (!) 149/106, pulse 68, temperature 98.9 F (37.2 C), temperature source Oral, resp. rate 16, height 5' 10 (1.778 m), weight 93 kg, SpO2 98%.  Assuming care from Dr. Waymond.  In short, Francisco Reed is a 63 y.o. male with a chief complaint of Leg Pain .  Refer to the original H&P for additional details.  The current plan of care is to follow-up CTA of lower extremity.  ----------------------------------------- 5:28 PM on 08/14/2023 ----------------------------------------- CTA of left lower extremity shows large calf hematoma, but no acute vascular finding noted.  On reassessment, patient has firm calf with tenderness but is neurovascularly intact distally with well-controlled pain, very low suspicion for compartment syndrome.  He is appropriate for discharge home with outpatient follow-up with orthopedics, was counseled to return to the ED for new or worsening symptoms.  Patient agrees with plan.     Willo Dunnings, MD 08/14/23 [phone]

## 2023-08-14 NOTE — ED Notes (Signed)
 ED Provider at bedside.

## 2023-08-20 ENCOUNTER — Telehealth (HOSPITAL_COMMUNITY): Payer: Self-pay | Admitting: *Deleted

## 2023-08-20 NOTE — Telephone Encounter (Signed)
 Patient called in stating he has torn his calf muscle with extensive bruising. Patient has decided to stop Eliquis  at this time since he is having no afib issues.  2. CHA2DS2VASc score of 0 Pt prefers to stay on eliquis  5 mg bid for now despite understanding guidelines do not recommend given his Chads score.    Pt will call if issues arise.

## 2023-08-25 ENCOUNTER — Other Ambulatory Visit: Payer: Self-pay | Admitting: Orthopedic Surgery

## 2023-08-25 ENCOUNTER — Encounter: Payer: Self-pay | Admitting: Orthopedic Surgery

## 2023-08-25 DIAGNOSIS — S8012XA Contusion of left lower leg, initial encounter: Secondary | ICD-10-CM

## 2023-08-25 NOTE — Progress Notes (Signed)
 Chief Complaint: Chief Complaint  Patient presents with  . Follow-up     Hematoma of left lower extremity      History of Present Illness Francisco Reed is a 63 year old male who presents with severe leg pain after a recent injury. He is accompanied by his wife.  He experienced severe leg pain after playing pickleball on August 13, 2023, describing it as feeling like being hit in the back of the leg, which caused him to fall. This incident is similar to a previous right leg injury involving a muscle tear and hematoma. A CT and ultrasound at the ER showed no deep vein thrombosis or rhabdomyolysis.  On August 18, 2023, operating a vibrating piece of equipment for eight to nine hours worsened his pain. He experiences increasing pain, especially when standing or walking, even with crutches. The pain is described as excruciating, with difficulty moving the ankle due to tightness and swelling. A painful knot developed on the day of the injury.  Patient was seen last week placed into a posterior stirrup splint with foot and plantarflexion.  Has had some fluctuating pain and swelling, splint had become uncomfortable.  Denies any numbness or tingling.  Still having moderate pain.  Has taken some the oxycodone  with some relief    Past Medical History: Past Medical History:  Diagnosis Date  . Atrial fibrillation (CMS/HHS-HCC)   . Cardiomyopathy, secondary (CMS/HHS-HCC)   . Chest pain   . GERD (gastroesophageal reflux disease)   . Hyperlipidemia   . Paroxysmal A-fib (CMS/HHS-HCC) 01/27/2014  . Sleep apnea     Past Surgical History: Past Surgical History:  Procedure Laterality Date  . CARDIOVERSION EXTERNAL      Past Family History: Family History  Problem Relation Age of Onset  . Cancer Mother   . Coronary Artery Disease (Blocked arteries around heart) Father   . Cancer Father   . Kidney failure Father   . Coronary Artery Disease (Blocked arteries around heart) Other   . Myocardial  Infarction (Heart attack) Brother   . Sudden death (unexpected death due to unknown cause) Brother     Medications: Current Outpatient Medications  Medication Sig Dispense Refill  . ascorbic acid (VITAMIN C) 1000 MG tablet Take 1,000 mg by mouth once daily.    . biotin 10,000 mcg Cap Take by mouth    . ELIQUIS  5 mg tablet TAKE 1 TABLET BY MOUTH EVERY 12 HOURS (Patient not taking: Reported on 08/20/2023) 180 tablet 1  . finasteride (PROPECIA) 1 mg tablet TAKE 1 TABLET BY MOUTH EVERY DAY 90 tablet 1  . flecainide  (TAMBOCOR ) 50 MG tablet TAKE 1 TABLET BY MOUTH TWICE A DAY (Patient not taking: Reported on 08/20/2023) 180 tablet 1  . HYDROcodone -acetaminophen  (NORCO) 5-325 mg tablet Take 1 tablet by mouth every 6 (six) hours as needed for Pain 30 tablet 0  . ondansetron  (ZOFRAN -ODT) 4 MG disintegrating tablet Take 1 tablet (4 mg total) by mouth every 8 (eight) hours as needed for Nausea for up to 7 days 15 tablet 0  . predniSONE (DELTASONE) 10 MG tablet Taper 6-5-4-3-2-1-off (Patient not taking: Reported on 08/20/2023) 21 tablet 0  . tadalafiL  (CIALIS ) 5 MG tablet TAKE 1 TABLET BY MOUTH EVERY DAY 90 tablet 3   No current facility-administered medications for this visit.    Allergies: No Known Allergies   Review of Systems:  A comprehensive 14 point ROS was performed, reviewed by me today, and the pertinent orthopaedic findings are documented in the  HPI.   Exam: BP 122/84   Ht 177.8 cm (5' 10)   Wt 93.4 kg (206 lb)   BMI 29.56 kg/m  General/Constitutional: The patient appears to be well-nourished, well-developed, and in no acute distress. Neuro/Psych: Normal mood and affect, oriented to person, place and time. Eyes: Non-icteric.  Pupils are equal, round, and reactive to light, and exhibit synchronous movement. ENT: Unremarkable. Lymphatic: No palpable adenopathy. Respiratory: Non-labored breathing Cardiovascular: No edema, swelling or tenderness, except as noted in detailed  exam. Integumentary: No impressive skin lesions present, except as noted in detailed exam. Musculoskeletal: Unremarkable, except as noted in detailed exam.  Examination of the left lower leg shows moderate swelling throughout the calf region into the foot and ankle with mild bruising and ecchymosis along the ankle.  Ankle plantarflexion dorsiflexion is intact but slightly limited secondary to pain.  There is good skin color throughout the foot and toes.  2+ capillary refill throughout the toes.  2+ dorsalis pedis pulse present.  Sensation is intact distally.  Patient does have some tenderness throughout the calf that is mild to moderate but not does not elicit any significant grimacing or severe debilitating pain.  Overall swelling appears to be slightly better than last visit.       CLINICAL DATA:  Injury to left calf last night with probable  hematoma and concern for compartment syndrome.   EXAM:  CT ANGIOGRAPHY OF ABDOMINAL AORTA WITH ILIOFEMORAL RUNOFF   TECHNIQUE:  Multidetector CT imaging of the abdomen, pelvis and lower  extremities was performed using the standard protocol during bolus  administration of intravenous contrast. Multiplanar CT image  reconstructions and MIPs were obtained to evaluate the vascular  anatomy.   RADIATION DOSE REDUCTION: This exam was performed according to the  departmental dose-optimization program which includes automated  exposure control, adjustment of the mA and/or kV according to  patient size and/or use of iterative reconstruction technique.   CONTRAST:  OMNIPAQUE  IOHEXOL  350 MG/ML SOLN   COMPARISON:  Left lower extremity venous duplex ultrasound earlier  today.   FINDINGS:  VASCULAR   Aorta: Normally patent abdominal aorta without atherosclerosis,  stenosis or dissection.   Celiac: Normally patent. Normally patent branch vessels and  branching anatomy.   SMA: Normally patent.   Renals: Normally patent bilateral single  renal arteries.   IMA: Normally patent.   RIGHT Lower Extremity   Inflow: Normally patent common, internal and external iliac  arteries.   Outflow: Normally patent common femoral arteries and femoral  bifurcations. Normally patent profunda femoral, femoral and  popliteal arteries.   Runoff: Normally patent three-vessel runoff below the knee into the  foot.   LEFT Lower Extremity   Inflow: Normally patent common, internal and external iliac  arteries.   Outflow: Normally patent common femoral arteries and femoral  bifurcations. Normally patent profunda femoral, femoral and  popliteal arteries.   Runoff: Normally patent three-vessel runoff below the knee into the  foot with relatively small anterior tibial artery compared to  posterior tibial and peroneal arteries.   Review of the MIP images confirms the above findings.   NON-VASCULAR   Lower chest: No acute abnormality.   Hepatobiliary: No focal liver abnormality is seen. No gallstones,  gallbladder wall thickening, or biliary dilatation.   Pancreas: Unremarkable. No pancreatic ductal dilatation or  surrounding inflammatory changes.   Spleen: Normal in size without focal abnormality.   Adrenals/Urinary Tract: No adrenal masses. No hydronephrosis or  renal calculi. Simple Bosniak 1 cysts  of the left kidney have a  benign appearance and do not require further follow-up.   Stomach/Bowel: Bowel shows no evidence of obstruction, ileus,  inflammation or lesion. The appendix is normal. No free  intraperitoneal air.   Lymphatic: No enlarged lymph nodes identified.   Reproductive: Prostate is unremarkable.   Other: No abdominal wall hernia or abnormality. No abdominopelvic  ascites.   Musculoskeletal: Relative enlargement the left soleus muscle in the  proximal calf with suggestion of intramuscular hematoma over an area  on axial images measuring roughly 4 x 5.7 cm and on reconstructions  measuring at least 10 cm in  length. This likely corresponds to the  elongated probable hematoma seen by ultrasound yesterday. No  contrast extravasation to suggest active bleeding.   IMPRESSION:  1. No evidence of significant arterial occlusive disease or  atherosclerosis in either lower extremity.  2. No evidence of acute arterial injury.  3. Relative enlargement of the left soleus muscle in the proximal  calf with suggestion of intramuscular hematoma over an area on axial  images measuring roughly 4 x 5.7 cm and on reconstructions measuring  at least 10 cm in length. This likely corresponds to the elongated  probable hematoma seen by ultrasound yesterday.    Electronically Signed    By: Marcey Moan M.D.    On: 08/14/2023 17:08      CLINICAL DATA:  Left calf pain and swelling for 1 day, recent  injury, discoloration, anticoagulated   EXAM:  LEFT LOWER EXTREMITY VENOUS DOPPLER ULTRASOUND   TECHNIQUE:  Gray-scale sonography with graded compression, as well as color  Doppler and duplex ultrasound were performed to evaluate the lower  extremity deep venous systems from the level of the common femoral  vein and including the common femoral, femoral, profunda femoral,  popliteal and calf veins including the posterior tibial, peroneal  and gastrocnemius veins when visible. Spectral Doppler was utilized  to evaluate flow at rest and with distal augmentation maneuvers in  the common femoral, femoral and popliteal veins.   COMPARISON:  None Available.   FINDINGS:  Contralateral Common Femoral Vein: Respiratory phasicity is normal  and symmetric with the symptomatic side. No evidence of thrombus.  Normal compressibility.   Common Femoral Vein: No evidence of thrombus. Normal  compressibility, respiratory phasicity and response to augmentation.   Saphenofemoral Junction: No evidence of thrombus. Normal  compressibility and flow on color Doppler imaging.   Profunda Femoral Vein: No evidence of  thrombus. Normal  compressibility and flow on color Doppler imaging.   Femoral Vein: No evidence of thrombus. Normal compressibility,  respiratory phasicity and response to augmentation.   Popliteal Vein: No evidence of thrombus. Normal compressibility,  respiratory phasicity and response to augmentation.   Calf Veins: Limited visualization but appear grossly patent.   Other Findings: Posterior calf complex elongated and ill-defined  hypoechoic area along the calf musculature deep to the subcutaneous  fat noted this measures 15 cm in length and 7 cm in diameter. No  associated vascularity. Appearance is nonspecific but in the setting  of trauma, favored to represent hematoma related to the recent  injury.   IMPRESSION:  1. No evidence of deep venous thrombosis.  2. Posterior calf complex hypoechoic area as above, nonspecific but  favored to represent hematoma related to the recent injury.    Electronically Signed    By: CHRISTELLA.  Shick M.D.    On: 08/14/2023 14:47      Impression: Hematoma of left lower leg [  S80.12XA] Hematoma of left lower leg  (primary encounter diagnosis) Gastrocnemius tear, left, initial encounter  Plan:  40.  63 year old male with left calf strain/tear August 13, 2023.  He will continue with Tylenol , oxycodone  as needed.  He will continue nonweightbearing and is placed into a boot he brought from home with heel wedges to place the ankle into plantarflexion.  He will continue with this, keep elevated and stay nonweightbearing.  Ace wrap's x 2 were applied around the foot and ankle and lower leg to help with swelling today prior to placing him into the boot.  I have ordered a stat MRI of his calf to evaluate complexity of muscle tear.  Call after MRI to discuss results. This note was generated in part with voice recognition software and I apologize for any typographical errors that were not detected and corrected.   Debby Lonni Amber MPA-C

## 2023-08-27 ENCOUNTER — Other Ambulatory Visit

## 2023-09-02 NOTE — Progress Notes (Signed)
 Chief Complaint: Chief Complaint  Patient presents with  . Leg Pain    Left leg pain   Today 09/02/2023 patient comes in for recheck of his left calf pain, swelling.  He recently underwent an MRI showing complex fluid collection along the medial head of the gastroc with a partial tear.  Patient's pain has been slowly improving.  He has had some fluctuating swelling along the hamstring region mostly with elevation of the foot and ankle above the heart he has noticed some swelling in the hamstring region.  He denies any numbness tingling.  Ankle range of motion is improving.    History of Present Illness Francisco Reed is a 63 year old male who presents with severe leg pain after a recent injury. He is accompanied by his wife.  He experienced severe leg pain after playing pickleball on August 13, 2023, describing it as feeling like being hit in the back of the leg, which caused him to fall. This incident is similar to a previous right leg injury involving a muscle tear and hematoma. A CT and ultrasound at the ER showed no deep vein thrombosis or rhabdomyolysis.  On August 18, 2023, operating a vibrating piece of equipment for eight to nine hours worsened his pain. He experiences increasing pain, especially when standing or walking, even with crutches. The pain is described as excruciating, with difficulty moving the ankle due to tightness and swelling. A painful knot developed on the day of the injury.  Patient was seen last week placed into a posterior stirrup splint with foot and plantarflexion.  Has had some fluctuating pain and swelling, splint had become uncomfortable.  Denies any numbness or tingling.  Still having moderate pain.  Has taken some the oxycodone  with some relief    Past Medical History: Past Medical History:  Diagnosis Date  . Atrial fibrillation (CMS/HHS-HCC)   . Cardiomyopathy, secondary (CMS/HHS-HCC)   . Chest pain   . GERD (gastroesophageal reflux disease)   .  Hyperlipidemia   . Paroxysmal A-fib (CMS/HHS-HCC) 01/27/2014  . Sleep apnea     Past Surgical History: Past Surgical History:  Procedure Laterality Date  . CARDIOVERSION EXTERNAL      Past Family History: Family History  Problem Relation Age of Onset  . Cancer Mother   . Coronary Artery Disease (Blocked arteries around heart) Father   . Cancer Father   . Kidney failure Father   . Coronary Artery Disease (Blocked arteries around heart) Other   . Myocardial Infarction (Heart attack) Brother   . Sudden death (unexpected death due to unknown cause) Brother     Medications: Current Outpatient Medications  Medication Sig Dispense Refill  . ascorbic acid  (VITAMIN C ) 1000 MG tablet Take 1,000 mg by mouth once daily.    . biotin  10,000 mcg Cap Take by mouth    . finasteride  (PROPECIA ) 1 mg tablet TAKE 1 TABLET BY MOUTH EVERY DAY 90 tablet 1  . HYDROcodone -acetaminophen  (NORCO) 5-325 mg tablet Take 1 tablet by mouth every 6 (six) hours as needed for Pain 30 tablet 0  . tadalafiL  (CIALIS ) 5 MG tablet TAKE 1 TABLET BY MOUTH EVERY DAY 90 tablet 3   No current facility-administered medications for this visit.    Allergies: No Known Allergies   Review of Systems:  A comprehensive 14 point ROS was performed, reviewed by me today, and the pertinent orthopaedic findings are documented in the HPI.   Exam: BP (!) 140/90   Ht 177.8 cm (5' 10)  Wt 93.4 kg (206 lb)   BMI 29.56 kg/m  General/Constitutional: The patient appears to be well-nourished, well-developed, and in no acute distress. Neuro/Psych: Normal mood and affect, oriented to person, place and time. Eyes: Non-icteric.  Pupils are equal, round, and reactive to light, and exhibit synchronous movement. ENT: Unremarkable. Lymphatic: No palpable adenopathy. Respiratory: Non-labored breathing Cardiovascular: No edema, swelling or tenderness, except as noted in detailed exam. Integumentary: No impressive skin lesions present,  except as noted in detailed exam. Musculoskeletal: Unremarkable, except as noted in detailed exam.  Examination of the left lower leg shows moderate swelling throughout the calf region into the foot and ankle with mild bruising and ecchymosis along the ankle.  Ankle plantarflexion dorsiflexion is intact but limited secondary to pain.  There is good skin color throughout the foot and toes.  2+ capillary refill throughout the toes.  2+ dorsalis pedis pulse present.  Sensation is intact distally.  Patient does have some tenderness throughout the calf that is mild to moderate but not does not elicit any significant grimacing or severe debilitating pain.  Tenderness and overall swelling continues to be better from previous visit.  No warmth or redness throughout the lower extremity.  Minimally tender along the hamstring distally with no defect      CLINICAL DATA:  Injury to left calf last night with probable  hematoma and concern for compartment syndrome.   EXAM:  CT ANGIOGRAPHY OF ABDOMINAL AORTA WITH ILIOFEMORAL RUNOFF   TECHNIQUE:  Multidetector CT imaging of the abdomen, pelvis and lower  extremities was performed using the standard protocol during bolus  administration of intravenous contrast. Multiplanar CT image  reconstructions and MIPs were obtained to evaluate the vascular  anatomy.   RADIATION DOSE REDUCTION: This exam was performed according to the  departmental dose-optimization program which includes automated  exposure control, adjustment of the mA and/or kV according to  patient size and/or use of iterative reconstruction technique.   CONTRAST:  OMNIPAQUE  IOHEXOL  350 MG/ML SOLN   COMPARISON:  Left lower extremity venous duplex ultrasound earlier  today.   FINDINGS:  VASCULAR   Aorta: Normally patent abdominal aorta without atherosclerosis,  stenosis or dissection.   Celiac: Normally patent. Normally patent branch vessels and  branching anatomy.   SMA:  Normally patent.   Renals: Normally patent bilateral single renal arteries.   IMA: Normally patent.   RIGHT Lower Extremity   Inflow: Normally patent common, internal and external iliac  arteries.   Outflow: Normally patent common femoral arteries and femoral  bifurcations. Normally patent profunda femoral, femoral and  popliteal arteries.   Runoff: Normally patent three-vessel runoff below the knee into the  foot.   LEFT Lower Extremity   Inflow: Normally patent common, internal and external iliac  arteries.   Outflow: Normally patent common femoral arteries and femoral  bifurcations. Normally patent profunda femoral, femoral and  popliteal arteries.   Runoff: Normally patent three-vessel runoff below the knee into the  foot with relatively small anterior tibial artery compared to  posterior tibial and peroneal arteries.   Review of the MIP images confirms the above findings.   NON-VASCULAR   Lower chest: No acute abnormality.   Hepatobiliary: No focal liver abnormality is seen. No gallstones,  gallbladder wall thickening, or biliary dilatation.   Pancreas: Unremarkable. No pancreatic ductal dilatation or  surrounding inflammatory changes.   Spleen: Normal in size without focal abnormality.   Adrenals/Urinary Tract: No adrenal masses. No hydronephrosis or  renal calculi. Simple  Bosniak 1 cysts of the left kidney have a  benign appearance and do not require further follow-up.   Stomach/Bowel: Bowel shows no evidence of obstruction, ileus,  inflammation or lesion. The appendix is normal. No free  intraperitoneal air.   Lymphatic: No enlarged lymph nodes identified.   Reproductive: Prostate is unremarkable.   Other: No abdominal wall hernia or abnormality. No abdominopelvic  ascites.   Musculoskeletal: Relative enlargement the left soleus muscle in the  proximal calf with suggestion of intramuscular hematoma over an area  on axial images measuring roughly 4  x 5.7 cm and on reconstructions  measuring at least 10 cm in length. This likely corresponds to the  elongated probable hematoma seen by ultrasound yesterday. No  contrast extravasation to suggest active bleeding.   IMPRESSION:  1. No evidence of significant arterial occlusive disease or  atherosclerosis in either lower extremity.  2. No evidence of acute arterial injury.  3. Relative enlargement of the left soleus muscle in the proximal  calf with suggestion of intramuscular hematoma over an area on axial  images measuring roughly 4 x 5.7 cm and on reconstructions measuring  at least 10 cm in length. This likely corresponds to the elongated  probable hematoma seen by ultrasound yesterday.    Electronically Signed    By: Marcey Moan M.D.    On: 08/14/2023 17:08      CLINICAL DATA:  Left calf pain and swelling for 1 day, recent  injury, discoloration, anticoagulated   EXAM:  LEFT LOWER EXTREMITY VENOUS DOPPLER ULTRASOUND   TECHNIQUE:  Gray-scale sonography with graded compression, as well as color  Doppler and duplex ultrasound were performed to evaluate the lower  extremity deep venous systems from the level of the common femoral  vein and including the common femoral, femoral, profunda femoral,  popliteal and calf veins including the posterior tibial, peroneal  and gastrocnemius veins when visible. Spectral Doppler was utilized  to evaluate flow at rest and with distal augmentation maneuvers in  the common femoral, femoral and popliteal veins.   COMPARISON:  None Available.   FINDINGS:  Contralateral Common Femoral Vein: Respiratory phasicity is normal  and symmetric with the symptomatic side. No evidence of thrombus.  Normal compressibility.   Common Femoral Vein: No evidence of thrombus. Normal  compressibility, respiratory phasicity and response to augmentation.   Saphenofemoral Junction: No evidence of thrombus. Normal  compressibility and flow on color  Doppler imaging.   Profunda Femoral Vein: No evidence of thrombus. Normal  compressibility and flow on color Doppler imaging.   Femoral Vein: No evidence of thrombus. Normal compressibility,  respiratory phasicity and response to augmentation.   Popliteal Vein: No evidence of thrombus. Normal compressibility,  respiratory phasicity and response to augmentation.   Calf Veins: Limited visualization but appear grossly patent.   Other Findings: Posterior calf complex elongated and ill-defined  hypoechoic area along the calf musculature deep to the subcutaneous  fat noted this measures 15 cm in length and 7 cm in diameter. No  associated vascularity. Appearance is nonspecific but in the setting  of trauma, favored to represent hematoma related to the recent  injury.   IMPRESSION:  1. No evidence of deep venous thrombosis.  2. Posterior calf complex hypoechoic area as above, nonspecific but  favored to represent hematoma related to the recent injury.    Electronically Signed    By: CHRISTELLA.  Shick M.D.    On: 08/14/2023 14:47   Gretel Emeline PARAS, MD - 08/26/2023  Formatting of this note might be different from the original.  TECHNIQUE: MRI TIBIA FIBULA LEFT WO CONTRAST-- Multiplanar, multisequence MR imaging of the left tibia/fibula was performed (contrast: none).   INDICATION: Hematoma of lower limb, left, initial encounter.  Injury. Pain. Swelling. Hematoma. Gastrocnemius tear.   COMPARISON: None.   FINDINGS:   SOFT TISSUES:  #  Complex fluid collection deep to the medial head of the gastrocnemius and superficial to the soleus. This measures 17.1 x 8.6 x 3.1 cm. This has intrinsic T1 hyperintensity as well as evidence of hemosiderin.  #  Distortion/irregularity of the medial head of the gastrocnemius may relate to partial tear of the myotendinous junction of the medial head of the gastrocnemius.  #  Nonspecific subcutaneous edema along the leg and anterior knee.  #  Small Baker cyst.   #  The Achilles tendon appears intact distally.   BONES/JOINTS:  #  No acute fractures or dislocations.  #  No significant joint effusions.  #  Partially imaged degenerative changes of the knee.    IMPRESSION:  1. Complex fluid collection deep to the medial head of the gastrocnemius measures 17.1 x 8.6 x 3.1 cm. This is favored to relate to hematoma. Recommend follow-up MRI to document resolution.  2.  Distortion and irregularity along the myotendinous junction of the medial head of the gastrocnemius likely relates to partial tear. An occult plantaris tear is not excluded.    Electronically Signed by: Emeline JINNY Poisson, MD on 08/26/2023 11:29 AM    Impression: Gastrocnemius tear, left, initial encounter [S86.112A] Gastrocnemius tear, left, initial encounter  (primary encounter diagnosis) Hematoma of left lower leg  Plan:  34.  63 year old male with left calf strain/tear August 13, 2023.  He will continue with Tylenol  as needed.  He will continue nonweightbearing and is placed into a boot he brought from home with heel wedges to place the ankle into plantarflexion.  He will continue with Ace wrap compression.  I have recommended him purchasing knee-high TED hose to help with swelling.  We will get him started with outpatient physical therapy to start working on some gentle range of motion exercises and perform some manual treatment to try and help improve swelling in the calf.  Would like to see the patient back in about 3 weeks for repeat evaluation.  He will call sooner for any worsening symptoms or any urgent changes in his health   this note was generated in part with voice recognition software and I apologize for any typographical errors that were not detected and corrected.   Debby Lonni Amber MPA-C

## 2024-01-15 ENCOUNTER — Emergency Department

## 2024-01-15 ENCOUNTER — Inpatient Hospital Stay
Admission: EM | Admit: 2024-01-15 | Discharge: 2024-01-18 | DRG: 310 | Disposition: A | Discharge status: Home / Self Care | Attending: Family Medicine | Admitting: Family Medicine

## 2024-01-15 ENCOUNTER — Other Ambulatory Visit: Payer: Self-pay

## 2024-01-15 DIAGNOSIS — I2510 Atherosclerotic heart disease of native coronary artery without angina pectoris: Secondary | ICD-10-CM | POA: Diagnosis present

## 2024-01-15 DIAGNOSIS — I4891 Unspecified atrial fibrillation: Principal | ICD-10-CM | POA: Diagnosis present

## 2024-01-15 DIAGNOSIS — I160 Hypertensive urgency: Secondary | ICD-10-CM | POA: Diagnosis present

## 2024-01-15 DIAGNOSIS — R350 Frequency of micturition: Secondary | ICD-10-CM | POA: Diagnosis present

## 2024-01-15 DIAGNOSIS — R079 Chest pain, unspecified: Secondary | ICD-10-CM | POA: Diagnosis present

## 2024-01-15 DIAGNOSIS — E782 Mixed hyperlipidemia: Secondary | ICD-10-CM | POA: Diagnosis not present

## 2024-01-15 DIAGNOSIS — G4733 Obstructive sleep apnea (adult) (pediatric): Secondary | ICD-10-CM | POA: Diagnosis not present

## 2024-01-15 DIAGNOSIS — I361 Nonrheumatic tricuspid (valve) insufficiency: Secondary | ICD-10-CM | POA: Diagnosis not present

## 2024-01-15 DIAGNOSIS — I340 Nonrheumatic mitral (valve) insufficiency: Secondary | ICD-10-CM | POA: Diagnosis not present

## 2024-01-15 DIAGNOSIS — I10 Essential (primary) hypertension: Secondary | ICD-10-CM | POA: Diagnosis present

## 2024-01-15 DIAGNOSIS — R739 Hyperglycemia, unspecified: Secondary | ICD-10-CM | POA: Diagnosis present

## 2024-01-15 DIAGNOSIS — I4819 Other persistent atrial fibrillation: Principal | ICD-10-CM | POA: Diagnosis present

## 2024-01-15 DIAGNOSIS — M1990 Unspecified osteoarthritis, unspecified site: Secondary | ICD-10-CM | POA: Diagnosis present

## 2024-01-15 DIAGNOSIS — I77810 Thoracic aortic ectasia: Secondary | ICD-10-CM | POA: Diagnosis present

## 2024-01-15 DIAGNOSIS — Z79899 Other long term (current) drug therapy: Secondary | ICD-10-CM

## 2024-01-15 DIAGNOSIS — R7303 Prediabetes: Secondary | ICD-10-CM | POA: Diagnosis present

## 2024-01-15 DIAGNOSIS — Z7901 Long term (current) use of anticoagulants: Secondary | ICD-10-CM

## 2024-01-15 LAB — CBC
HCT: 41.1 % (ref 39.0–52.0)
Hemoglobin: 13.5 g/dL (ref 13.0–17.0)
MCH: 29.9 pg (ref 26.0–34.0)
MCHC: 32.8 g/dL (ref 30.0–36.0)
MCV: 91.1 fL (ref 80.0–100.0)
Platelets: 228 K/uL (ref 150–400)
RBC: 4.51 MIL/uL (ref 4.22–5.81)
RDW: 13 % (ref 11.5–15.5)
WBC: 7.2 K/uL (ref 4.0–10.5)
nRBC: 0 % (ref 0.0–0.2)

## 2024-01-15 LAB — BASIC METABOLIC PANEL WITH GFR
Anion gap: 12 (ref 5–15)
BUN: 19 mg/dL (ref 8–23)
CO2: 22 mmol/L (ref 22–32)
Calcium: 9 mg/dL (ref 8.9–10.3)
Chloride: 103 mmol/L (ref 98–111)
Creatinine, Ser: 0.87 mg/dL (ref 0.61–1.24)
GFR, Estimated: >60 mL/min (ref >60)
Glucose, Bld: 121 mg/dL — ABNORMAL HIGH (ref 70–99)
Potassium: 3.8 mmol/L (ref 3.5–5.1)
Sodium: 137 mmol/L (ref 135–145)

## 2024-01-15 LAB — TROPONIN T, HIGH SENSITIVITY
Troponin T High Sensitivity: <15 ng/L (ref 0–19)
Troponin T High Sensitivity: <15 ng/L (ref 0–19)

## 2024-01-15 LAB — PRO BRAIN NATRIURETIC PEPTIDE: Pro Brain Natriuretic Peptide: <50 pg/mL (ref <300.0)

## 2024-01-15 MED ORDER — DILTIAZEM HCL 25 MG/5ML IV SOLN
10.0000 mg | Freq: Once | INTRAVENOUS | Status: AC
  Administered 2024-01-15: 10 mg via INTRAVENOUS
  Filled 2024-01-15: qty 5

## 2024-01-15 MED ORDER — ONDANSETRON HCL 4 MG/2ML IJ SOLN: 4.0000 mg | Freq: Four times a day (QID) | INTRAMUSCULAR | Status: DC | PRN

## 2024-01-15 MED ORDER — ALPRAZOLAM 0.25 MG PO TABS: 0.2500 mg | ORAL_TABLET | Freq: Two times a day (BID) | ORAL | Status: DC | PRN

## 2024-01-15 MED ORDER — VITAMIN D 25 MCG (1000 UNIT) PO TABS
5000.0000 [IU] | ORAL_TABLET | Freq: Every morning | ORAL | Status: DC
  Administered 2024-01-16 – 2024-01-18 (×3): 5000 [IU] via ORAL
  Filled 2024-01-15 (×3): qty 5

## 2024-01-15 MED ORDER — FINASTERIDE 1 MG PO TABS: 1.0000 mg | ORAL_TABLET | Freq: Every day | ORAL | Status: DC

## 2024-01-15 MED ORDER — METOPROLOL TARTRATE 25 MG PO TABS
25.0000 mg | ORAL_TABLET | Freq: Four times a day (QID) | ORAL | Status: DC
  Administered 2024-01-16 (×2): 25 mg via ORAL
  Filled 2024-01-15 (×2): qty 1

## 2024-01-15 MED ORDER — METOPROLOL TARTRATE 25 MG PO TABS
25.0000 mg | ORAL_TABLET | Freq: Once | ORAL | Status: AC
Start: 2024-01-15 — End: 2024-01-15
  Administered 2024-01-15: 25 mg via ORAL
  Filled 2024-01-15: qty 1

## 2024-01-15 MED ORDER — OXYCODONE-ACETAMINOPHEN 5-325 MG PO TABS: 1.0000 | ORAL_TABLET | ORAL | Status: DC | PRN

## 2024-01-15 MED ORDER — APIXABAN 5 MG PO TABS: 5.0000 mg | ORAL_TABLET | Freq: Two times a day (BID) | ORAL | Status: DC

## 2024-01-15 MED ORDER — TRAZODONE HCL 50 MG PO TABS
25.0000 mg | ORAL_TABLET | Freq: Every evening | ORAL | Status: DC | PRN
  Administered 2024-01-16 – 2024-01-17 (×3): 25 mg via ORAL
  Filled 2024-01-15 (×3): qty 1

## 2024-01-15 MED ORDER — MAGNESIUM HYDROXIDE 400 MG/5ML PO SUSP: 30.0000 mL | Freq: Every day | ORAL | Status: DC | PRN

## 2024-01-15 MED ORDER — VITAMIN C 500 MG PO TABS
2000.0000 mg | ORAL_TABLET | Freq: Every day | ORAL | Status: DC
  Administered 2024-01-16 – 2024-01-18 (×3): 2000 mg via ORAL
  Filled 2024-01-15 (×3): qty 4

## 2024-01-15 MED ORDER — DILTIAZEM HCL-DEXTROSE 125-5 MG/125ML-% IV SOLN (PREMIX)
5.0000 mg/h | INTRAVENOUS | Status: DC
  Administered 2024-01-15: 5 mg/h via INTRAVENOUS
  Administered 2024-01-16: 15 mg/h via INTRAVENOUS
  Administered 2024-01-17: 7.5 mg/h via INTRAVENOUS
  Administered 2024-01-17: 10 mg/h via INTRAVENOUS
  Filled 2024-01-15 (×4): qty 125

## 2024-01-15 MED ORDER — ACETAMINOPHEN 325 MG PO TABS: 650.0000 mg | ORAL_TABLET | ORAL | Status: DC | PRN

## 2024-01-15 MED ORDER — APIXABAN 5 MG PO TABS
5.0000 mg | ORAL_TABLET | Freq: Two times a day (BID) | ORAL | Status: DC
  Administered 2024-01-15 – 2024-01-18 (×6): 5 mg via ORAL
  Filled 2024-01-15 (×6): qty 1

## 2024-01-15 MED ORDER — POTASSIUM CHLORIDE 20 MEQ PO PACK: 40.0000 meq | PACK | Freq: Once | ORAL | Status: DC

## 2024-01-15 MED ORDER — BIOTIN 10 MG PO CAPS: 1.0000 | ORAL_CAPSULE | Freq: Every morning | ORAL | Status: DC

## 2024-01-15 NOTE — ED Provider Notes (Signed)
 [facility] Provider Note    Event Date/Time   First MD Initiated Contact with Patient 01/15/24 1753     (approximate)   History   Chest Pain   HPI  Francisco Reed is a 63 y.o. male with a history of paroxysmal A-fib not on anticoagulation and CHF who presents with chest discomfort, acute onset an hour and a half ago, described as palpitations, feeling his heart racing, with some discomfort.  He denies acute pain.  He reports associated lightheadedness but no shortness of breath.  He states it feels like prior episodes of atrial fibrillation.  The patient states that he was last in A-fib about a year ago.  He stopped taking flecainide  and Eliquis  about 5 months ago.  He took flecainide  today after the symptoms started, without any change.  I reviewed the past medical records.  The patient's most recent outpatient encounters were with orthopedics.  Recently he was seen on 6/13 by cardiology for follow-up of his A-fib on flecainide .   Physical Exam   Triage Vital Signs: ED Triage Vitals  Encounter Vitals Group     BP 01/15/24 1751 (!) 157/132     Girls Systolic BP Percentile --      Girls Diastolic BP Percentile --      Boys Systolic BP Percentile --      Boys Diastolic BP Percentile --      Pulse Rate 01/15/24 1751 (!) 42     Resp 01/15/24 1751 18     Temp 01/15/24 1751 98 F (36.7 C)     Temp src --      SpO2 01/15/24 1751 99 %     Weight 01/15/24 1751 205 lb (93 kg)     Height 01/15/24 1751 5' 10 (1.778 m)     Head Circumference --      Peak Flow --      Pain Score 01/15/24 1750 4     Pain Loc --      Pain Education --      Exclude from Growth Chart --     Most recent vital signs: Vitals:   01/15/24 1945 01/15/24 2027  BP: (!) 132/91 (!) 123/96  Pulse: (!) 121 (!) 125  Resp:  19  Temp:    SpO2:  98%     General: Alert, relatively well-appearing, no distress.  CV:  Good peripheral perfusion.  Tachycardic, irregular  rhythm. Resp:  Normal effort.  Lungs CTAB. Abd:  No distention.  Other:  No peripheral edema.   ED Results / Procedures / Treatments   Labs (all labs ordered are listed, but only abnormal results are displayed) Labs Reviewed  BASIC METABOLIC PANEL WITH GFR - Abnormal; Notable for the following components:      Result Value   Glucose, Bld 121 (*)    All other components within normal limits  CBC  PRO BRAIN NATRIURETIC PEPTIDE  TROPONIN T, HIGH SENSITIVITY  TROPONIN T, HIGH SENSITIVITY     EKG  ED ECG REPORT I, Waylon Cassis, the attending physician, personally viewed and interpreted this ECG.  Date: 01/15/2024 EKG Time: 1759 Rate: 157 Rhythm: Atrial fibrillation with RVR QRS Axis: normal Intervals: normal ST/T Wave abnormalities: normal Narrative Interpretation: no evidence of acute ischemia    RADIOLOGY  Chest x-ray: I independently viewed and interpreted the images; there is no focal consolidation or edema   PROCEDURES:  Critical Care performed: Yes, see critical care procedure note(s)  .Critical Care  Performed by: Jacolyn Pae, MD Authorized by: Jacolyn Pae, MD   Critical care provider statement:    Critical care time (minutes):  30   Critical care time was exclusive of:  Separately billable procedures and treating other patients   Critical care was necessary to treat or prevent imminent or life-threatening deterioration of the following conditions:  Cardiac failure   Critical care was time spent personally by me on the following activities:  Development of treatment plan with patient or surrogate, discussions with consultants, evaluation of patient's response to treatment, examination of patient, ordering and review of laboratory studies, ordering and review of radiographic studies, ordering and performing treatments and interventions, pulse oximetry, re-evaluation of patient's condition, review of old charts and obtaining history from  patient or surrogate   Care discussed with: admitting provider      MEDICATIONS ORDERED IN ED: Medications  diltiazem  (CARDIZEM ) 125 mg in dextrose  5% 125 mL (1 mg/mL) infusion (10 mg/hr Intravenous Rate/Dose Change 01/15/24 1903)  apixaban  (ELIQUIS ) tablet 5 mg (has no administration in time range)  diltiazem  (CARDIZEM ) injection 10 mg (10 mg Intravenous Given 01/15/24 1811)  metoprolol  tartrate (LOPRESSOR ) tablet 25 mg (25 mg Oral Given 01/15/24 1945)     IMPRESSION / MDM / ASSESSMENT AND PLAN / ED COURSE  I reviewed the triage vital signs and the nursing notes.  63 year old male with PMH as noted above presents with acute onset of chest discomfort, palpitations, and lightheadedness.  On exam the patient is in atrial fibrillation with RVR, with a rate in the 150s to 170s.  Sickle exam is otherwise unremarkable for acute findings.  Blood pressure is elevated.  Differential diagnosis includes, but is not limited to, fibrillation/flutter.  Since the patient has not been anticoagulated and has a history of atrial fibrillation, he will not be amenable to bedside cardioversion and will need rate control.  I have ordered IV Cardizem  for treatment, as well as lab workup and chest x-ray for further evaluation.  Patient's presentation is most consistent with acute presentation with potential threat to life or bodily function.  The patient is on the cardiac monitor to evaluate for evidence of arrhythmia and/or significant heart rate changes.   ----------------------------------------- 8:26 PM on 01/15/2024 -----------------------------------------  The patient had no significant response to the first dose of IV Cardizem .  I started him on a Cardizem  infusion and he is now on the maximal dose.  His rate is now in the 120s to 130s, still in atrial fibrillation.  He has had two 9-10 beat runs of nonsustained V. tach.  Lab workup is unremarkable.  BMP, CBC show no acute findings.  Troponin is  negative.  BNP is negative.  Chest x-ray is clear.  I consulted and discussed the case with Dr. Lonni from cardiology.  She agrees that she does not recommend cardioversion given that he has not been anticoagulated.  She does recommend starting back up on his Eliquis  and continuing the Cardizem  infusion.  I suggested possibly giving amiodarone, however she advised against this unless the patient has sustained V. tach.  She recommends starting the patient on a fractionated dose of metoprolol  every 6 hours in combination with the Cardizem  for better rate control.  The patient appears somewhat frustrated with the situation, and would like to not be admitted.  He has asked several times about cardioversion although I have explained the risks and why we do not recommend it.  I have discussed the overall plan of care and reasoning for  the treatment as well as cardiology recommendations.  I advised that admission would be strongly recommended to continue rate control until he can be anticoagulated appropriately.  The patient agrees with the plan.  I consulted Dr. Lawence from the hospitalist service; based on our discussion he agrees to evaluate the patient for admission.   FINAL CLINICAL IMPRESSION(S) / ED DIAGNOSES   Final diagnoses:  Atrial fibrillation with RVR (HCC)     Rx / DC Orders   ED Discharge Orders     None        Note:  This document was prepared using Dragon voice recognition software and may include unintentional dictation errors.    Jacolyn Pae, MD 01/15/24 2029

## 2024-01-15 NOTE — ED Notes (Signed)
 Titrate Cardizem  per 15 minutes per verbal order from Jacolyn, MD

## 2024-01-15 NOTE — Assessment & Plan Note (Addendum)
-   The patient will be admitted to a progressive unit bed. - Will continue IV Cardizem  drip. - She will be continued on Lopressor  25 mg p.o. every 6 hours. - Will place him back on Eliquis . - Will continue flecainide . - Cardiology consult will be obtained. - Dr. Lonni was notified about the patient. - Unless he converts during the weekend, a cardioversion will be planned on Monday.

## 2024-01-15 NOTE — ED Notes (Signed)
 Pt taken to ED 3 since EMS was in ED 24. Pt placed on monitor and EKG performed. MD Siadecki at bedside, RN Hailey informed of pt in room and Charge RN Jon.

## 2024-01-15 NOTE — ED Triage Notes (Addendum)
 Pt comes with c/o cp started hour ago. Pt states hx of afib. Pt states it was beating in 180s. Pt is not on thinner. Pt states  no pain just racing in chest. Pt states little sob.

## 2024-01-15 NOTE — H&P (Signed)
 [place]   PATIENT NAME: Francisco Reed    MR#:  [id number]  DATE OF BIRTH:  03-19-1960  DATE OF ADMISSION:  01/15/2024  PRIMARY CARE PHYSICIAN: Auston Reyes BIRCH, MD   Patient is coming from: Home  REQUESTING/REFERRING PHYSICIAN: Siadecki, Sebastien, MD  CHIEF COMPLAINT:   Chief Complaint  Patient presents with   Chest Pain    HISTORY OF PRESENT ILLNESS:  Francisco Reed is a 63 y.o. male with medical history significant for chronic atrial fibrillation, chronic systolic dysfunction, OSA, and osteoarthritis, who presented to the emergency room with acute onset of midsternal chest pain felt as pressure with associated palpitations.  He was recently taken off of Eliquis  by his PCP as he had calf injury and hematoma.  He previously had 2 ablations for paroxysmal atrial fibrillation as well as 15 cardioversions.  He has been having urinary frequency without dysuria or hematuria or flank pain.  No fever or chills.  No cough or wheezing or dyspnea.  ED Course: When the patient came to the ER, BP was 157/132 and later 140/119 with heart rate of 42 and later 67 with otherwise normal vital signs.  Labs revealed unremarkable BMP and CBC.  High sensitive troponin I was less than 15 twice.  proBNP was less than 50. EKG as reviewed by me : EKG showed atrial fibrillation with RVR of 157. Imaging: Portable chest x-ray showed no acute cardiopulmonary disease.  The patient was given 10 mg of IV Cardizem  followed by IV Cardizem  drip.  After consultation with Dr. Lonni he recommended 25 mg p.o. Lopressor  every 6 hours.  The patient was also given Eliquis  5 mg p.o. daily.  He will be admitted to a PCU for further evaluation and management. PAST MEDICAL HISTORY:   Past Medical History:  Diagnosis Date   A-fib [facility])    Arthritis    hands   Chronic systolic dysfunction of left ventricle    Obstructive sleep apnea    mild   Persistent atrial fibrillation (HCC)    Wears hearing aid  in both ears    12/11/22.  Currently non-functional    PAST SURGICAL HISTORY:   Past Surgical History:  Procedure Laterality Date   ATRIAL FIBRILLATION ABLATION N/A 05/29/2017   Procedure: ATRIAL FIBRILLATION ABLATION;  Surgeon: Kelsie Agent, MD;  Location: [facility] INVASIVE CV LAB;  Service: Cardiovascular;  Laterality: N/A;   CARDIAC CATHETERIZATION     CARDIOVERSION     CARDIOVERSION N/A 12/24/2016   Procedure: CARDIOVERSION;  Surgeon: Delford Maude BROCKS, MD;  Location: [facility] ENDOSCOPY;  Service: Cardiovascular;  Laterality: N/A;   CARDIOVERSION N/A 03/20/2017   Procedure: CARDIOVERSION;  Surgeon: Okey Vina GAILS, MD;  Location: [facility] ENDOSCOPY;  Service: Cardiovascular;  Laterality: N/A;   CATARACT EXTRACTION W/PHACO Right 12/18/2022   Procedure: CATARACT EXTRACTION PHACO AND INTRAOCULAR LENS PLACEMENT (IOC) RIGHT 5.39 00:32.8;  Surgeon: Enola Feliciano Hugger, MD;  Location: [facility] SURGERY CNTR;  Service: Ophthalmology;  Laterality: Right;   COLONOSCOPY WITH PROPOFOL  N/A 12/10/2018   Procedure: COLONOSCOPY WITH PROPOFOL ;  Surgeon: Therisa Bi, MD;  Location: [facility] ENDOSCOPY;  Service: Gastroenterology;  Laterality: N/A;   ELECTROPHYSIOLOGIC STUDY N/A 12/28/2014   Procedure: CARDIOVERSION;  Surgeon: Wolm JINNY Rhyme, MD;  Location: [facility] ORS;  Service: Cardiovascular;  Laterality: N/A;   ELECTROPHYSIOLOGIC STUDY N/A 09/07/2015   Procedure: CARDIOVERSION;  Surgeon: Wolm JINNY Rhyme, MD;  Location: [facility] ORS;  Service: Cardiovascular;  Laterality: N/A;   ELECTROPHYSIOLOGIC STUDY N/A 03/18/2016   Procedure:  Atrial Fibrillation Ablation;  Surgeon: Lynwood Rakers, MD;  Location: [facility] INVASIVE CV LAB;  Service: Cardiovascular;  Laterality: N/A;   RIGHT/LEFT HEART CATH AND CORONARY ANGIOGRAPHY N/A 08/06/2017   Procedure: RIGHT/LEFT HEART CATH AND CORONARY ANGIOGRAPHY;  Surgeon: Jordan, Peter M, MD;  Location: [facility] INVASIVE CV LAB;  Service: Cardiovascular;  Laterality: N/A;   TEE WITHOUT CARDIOVERSION N/A 09/07/2015    Procedure: TRANSESOPHAGEAL ECHOCARDIOGRAM (TEE);  Surgeon: Wolm JINNY Rhyme, MD;  Location: [facility] ORS;  Service: Cardiovascular;  Laterality: N/A;   TEE WITHOUT CARDIOVERSION N/A 03/17/2016   Procedure: TRANSESOPHAGEAL ECHOCARDIOGRAM (TEE);  Surgeon: Redell GORMAN Shallow, MD;  Location: [facility] ENDOSCOPY;  Service: Cardiovascular;  Laterality: N/A;   TEE WITHOUT CARDIOVERSION N/A 12/24/2016   Procedure: TRANSESOPHAGEAL ECHOCARDIOGRAM (TEE);  Surgeon: Delford Maude BROCKS, MD;  Location: [facility] ENDOSCOPY;  Service: Cardiovascular;  Laterality: N/A;   TEE WITHOUT CARDIOVERSION N/A 05/29/2017   Procedure: TRANSESOPHAGEAL ECHOCARDIOGRAM (TEE);  Surgeon: Shallow Redell GORMAN, MD;  Location: [facility] ENDOSCOPY;  Service: Cardiovascular;  Laterality: N/A;    SOCIAL HISTORY:   Social History   Tobacco Use   Smoking status: Never   Smokeless tobacco: Never  Substance Use Topics   Alcohol use: No    FAMILY HISTORY:  History reviewed. No pertinent family history.  DRUG ALLERGIES:  No Known Allergies  REVIEW OF SYSTEMS:   ROS As per history of present illness. All pertinent systems were reviewed above. Constitutional, HEENT, cardiovascular, respiratory, GI, GU, musculoskeletal, neuro, psychiatric, endocrine, integumentary and hematologic systems were reviewed and are otherwise negative/unremarkable except for positive findings mentioned above in the HPI.   MEDICATIONS AT HOME:   Prior to Admission medications   Medication Sig Start Date End Date Taking? Authorizing Provider  acetaminophen  (TYLENOL ) 500 MG tablet Take 1,000 mg by mouth as needed for mild pain (pain score 1-3) or moderate pain (pain score 4-6).    [provider]  ascorbic acid (VITAMIN C) 1000 MG tablet Take 2 tablets by mouth daily.    [provider]  Biotin 10 MG CAPS Take 1 capsule by mouth every morning.    [provider]  Cholecalciferol (VITAMIN D-3 PO) Take 1 tablet by mouth every morning.    [provider]   finasteride (PROPECIA) 1 MG tablet Take 1 tablet by mouth daily. 04/18/20   [provider]  oxyCODONE -acetaminophen  (PERCOCET) 5-325 MG tablet Take 1 tablet by mouth every 4 (four) hours as needed. 08/14/23 08/13/24  Willo Dunnings, MD  tadalafil  (CIALIS ) 5 MG tablet Take 1 tablet (5 mg total) by mouth daily as needed for erectile dysfunction. 08/05/18   Allred, Lynwood, MD      VITAL SIGNS:  Blood pressure 128/88, pulse 96, temperature 97.8 F (36.6 C), temperature source Oral, resp. rate (!) 23, height 5' 10 (1.778 m), weight 93 kg, SpO2 98%.  PHYSICAL EXAMINATION:  Physical Exam  GENERAL:  63 y.o.-year-old Caucasian male patient lying in the bed with no acute distress.  EYES: Pupils equal, round, reactive to light and accommodation. No scleral icterus. Extraocular muscles intact.  HEENT: Head atraumatic, normocephalic. Oropharynx and nasopharynx clear.  NECK:  Supple, no jugular venous distention. No thyroid enlargement, no tenderness.  LUNGS: Normal breath sounds bilaterally, no wheezing, rales,rhonchi or crepitation. No use of accessory muscles of respiration.  CARDIOVASCULAR: Irregularly irregular tachycardic rhythm, S1, S2 normal. No murmurs, rubs, or gallops.  ABDOMEN: Soft, nondistended, nontender. Bowel sounds present. No organomegaly or mass.  EXTREMITIES: No pedal edema, cyanosis, or clubbing.  NEUROLOGIC: Cranial  nerves II through XII are intact. Muscle strength 5/5 in all extremities. Sensation intact. Gait not checked.  PSYCHIATRIC: The patient is alert and oriented x 3.  Normal affect and good eye contact. SKIN: No obvious rash, lesion, or ulcer.   LABORATORY PANEL:   CBC Recent Labs  Lab 01/15/24 1808  WBC 7.2  HGB 13.5  HCT 41.1  PLT 228   ------------------------------------------------------------------------------------------------------------------  Chemistries  Recent Labs  Lab 01/15/24 1808  NA 137  K 3.8  CL 103  CO2 22  GLUCOSE 121*  BUN  19  CREATININE 0.87  CALCIUM 9.0   ------------------------------------------------------------------------------------------------------------------  Cardiac Enzymes No results for input(s): TROPONINI in the last 168 hours. ------------------------------------------------------------------------------------------------------------------  RADIOLOGY:  DG Chest Port 1 View Result Date: 01/15/2024 CLINICAL DATA:  Chest pain beginning an hour prior to admission. EXAM: PORTABLE CHEST 1 VIEW COMPARISON:  12/22/2022 and older exams. FINDINGS: Cardiac silhouette is normal in size and configuration. No mediastinal or hilar masses. Clear lungs.  No pleural effusion or pneumothorax. Skeletal structures are grossly intact. IMPRESSION: No active disease. Electronically Signed   By: Alm Parkins M.D.   On: 01/15/2024 18:29      IMPRESSION AND PLAN:  Assessment and Plan: * Atrial fibrillation with RVR (HCC) - The patient will be admitted to a progressive unit bed. - Will continue IV Cardizem  drip. - She will be continued on Lopressor  25 mg p.o. every 6 hours. - Will place him back on Eliquis . - Will continue flecainide . - Cardiology consult will be obtained. - Dr. Lonni was notified about the patient. - Unless he converts during the weekend, a cardioversion will be planned on Monday.  Hypertensive urgency - Will continue antihypertensive therapy. - The patient will be on IV Cardizem  drip as mentioned above  Chest pain - This is likely secondary to #1. - Will follow cell troponins. - Will be placed on as needed supplement nitroglycerin IV morphine  sulfate for pain. - Management otherwise as above.   DVT prophylaxis: Eliquis . Advanced Care Planning:  Code Status: full code. Family Communication:  The plan of care was discussed in details with the patient (and family). I answered all questions. The patient agreed to proceed with the above mentioned plan. Further management will  depend upon hospital course. Disposition Plan: Back to previous home environment Consults called: Cardiology. All the records are reviewed and case discussed with ED provider.  Status is: Inpatient  At the time of the admission, it appears that the appropriate admission status for this patient is inpatient.  This is judged to be reasonable and necessary in order to provide the required intensity of service to ensure the patient's safety given the presenting symptoms, physical exam findings and initial radiographic and laboratory data in the context of comorbid conditions.  The patient requires inpatient status due to high intensity of service, high risk of further deterioration and high frequency of surveillance required.  I certify that at the time of admission, it is my clinical judgment that the patient will require inpatient hospital care extending more than 2 midnights.                            Dispo: The patient is from: Home              Anticipated d/c is to: Home              Patient currently is not medically stable to d/c.  Difficult to place patient: No  Madison DELENA Peaches M.D on 01/15/2024 at 11:18 PM  [facility]   From 7 PM-7 AM, contact night-coverage [email or web address]  CC: Primary care physician; Auston Reyes BIRCH, MD

## 2024-01-15 NOTE — ED Notes (Signed)
 MD Siadecki at bedside updating pt and family member on plan of care after discussion with the cardiology team. Pt remains asymptomatic at this time.

## 2024-01-15 NOTE — Assessment & Plan Note (Signed)
-   Will continue antihypertensive therapy. - The patient will be on IV Cardizem  drip as mentioned above

## 2024-01-15 NOTE — Assessment & Plan Note (Signed)
-   This is likely secondary to #1. - Will follow cell troponins. - Will be placed on as needed supplement nitroglycerin IV morphine  sulfate for pain. - Management otherwise as above.

## 2024-01-16 DIAGNOSIS — I160 Hypertensive urgency: Secondary | ICD-10-CM

## 2024-01-16 DIAGNOSIS — I2510 Atherosclerotic heart disease of native coronary artery without angina pectoris: Secondary | ICD-10-CM | POA: Diagnosis not present

## 2024-01-16 DIAGNOSIS — I4891 Unspecified atrial fibrillation: Secondary | ICD-10-CM | POA: Diagnosis not present

## 2024-01-16 LAB — BASIC METABOLIC PANEL WITH GFR
Anion gap: 9 (ref 5–15)
BUN: 18 mg/dL (ref 8–23)
CO2: 26 mmol/L (ref 22–32)
Calcium: 9.2 mg/dL (ref 8.9–10.3)
Chloride: 104 mmol/L (ref 98–111)
Creatinine, Ser: 0.78 mg/dL (ref 0.61–1.24)
GFR, Estimated: >60 mL/min (ref >60)
Glucose, Bld: 109 mg/dL — ABNORMAL HIGH (ref 70–99)
Potassium: 4 mmol/L (ref 3.5–5.1)
Sodium: 138 mmol/L (ref 135–145)

## 2024-01-16 LAB — HIV ANTIBODY (ROUTINE TESTING W REFLEX): HIV Screen 4th Generation wRfx: NONREACTIVE

## 2024-01-16 LAB — CBC
HCT: 40.8 % (ref 39.0–52.0)
Hemoglobin: 13.7 g/dL (ref 13.0–17.0)
MCH: 30.2 pg (ref 26.0–34.0)
MCHC: 33.6 g/dL (ref 30.0–36.0)
MCV: 89.9 fL (ref 80.0–100.0)
Platelets: 234 K/uL (ref 150–400)
RBC: 4.54 MIL/uL (ref 4.22–5.81)
RDW: 13 % (ref 11.5–15.5)
WBC: 7.1 K/uL (ref 4.0–10.5)
nRBC: 0 % (ref 0.0–0.2)

## 2024-01-16 LAB — MAGNESIUM: Magnesium: 2 mg/dL (ref 1.7–2.4)

## 2024-01-16 MED ORDER — FLECAINIDE ACETATE 50 MG PO TABS: 50.0000 mg | ORAL_TABLET | Freq: Two times a day (BID) | ORAL | Status: DC

## 2024-01-16 MED ORDER — FLECAINIDE ACETATE 50 MG PO TABS
50.0000 mg | ORAL_TABLET | Freq: Two times a day (BID) | ORAL | Status: DC
  Administered 2024-01-16 – 2024-01-18 (×4): 50 mg via ORAL
  Filled 2024-01-16 (×5): qty 1

## 2024-01-16 MED ORDER — DILTIAZEM HCL 30 MG PO TABS
30.0000 mg | ORAL_TABLET | Freq: Four times a day (QID) | ORAL | Status: DC
  Administered 2024-01-16 – 2024-01-17 (×4): 30 mg via ORAL
  Filled 2024-01-16 (×4): qty 1

## 2024-01-16 MED ORDER — FLECAINIDE ACETATE 50 MG PO TABS
50.0000 mg | ORAL_TABLET | Freq: Two times a day (BID) | ORAL | Status: DC
  Administered 2024-01-16: 50 mg via ORAL
  Filled 2024-01-16 (×2): qty 1

## 2024-01-16 MED ORDER — METOPROLOL TARTRATE 25 MG PO TABS
25.0000 mg | ORAL_TABLET | Freq: Two times a day (BID) | ORAL | Status: DC
  Administered 2024-01-16 – 2024-01-18 (×4): 25 mg via ORAL
  Filled 2024-01-16 (×4): qty 1

## 2024-01-16 MED ORDER — METOPROLOL TARTRATE 25 MG PO TABS: 25.0000 mg | ORAL_TABLET | Freq: Two times a day (BID) | ORAL | Status: DC

## 2024-01-16 NOTE — Plan of Care (Signed)
  Problem: Education: Goal: Knowledge of General Education information will improve Description: Including pain rating scale, medication(s)/side effects and non-pharmacologic comfort measures Outcome: Progressing   Problem: Health Behavior/Discharge Planning: Goal: Ability to manage health-related needs will improve Outcome: Progressing   Problem: Clinical Measurements: Goal: Ability to maintain clinical measurements within normal limits will improve Outcome: Progressing Goal: Will remain free from infection Outcome: Progressing Goal: Diagnostic test results will improve Outcome: Progressing Goal: Respiratory complications will improve Outcome: Progressing Goal: Cardiovascular complication will be avoided Outcome: Progressing   Problem: Activity: Goal: Risk for activity intolerance will decrease Outcome: Progressing   Problem: Nutrition: Goal: Adequate nutrition will be maintained Outcome: Progressing   Problem: Coping: Goal: Level of anxiety will decrease Outcome: Progressing   Problem: Elimination: Goal: Will not experience complications related to bowel motility Outcome: Progressing Goal: Will not experience complications related to urinary retention Outcome: Progressing   Problem: Pain Managment: Goal: General experience of comfort will improve and/or be controlled Outcome: Progressing   Problem: Safety: Goal: Ability to remain free from injury will improve Outcome: Progressing   Problem: Skin Integrity: Goal: Risk for impaired skin integrity will decrease Outcome: Progressing   Problem: Education: Goal: Knowledge of disease or condition will improve Outcome: Progressing Goal: Understanding of medication regimen will improve Outcome: Progressing Goal: Individualized Educational Video(s) Outcome: Progressing   Problem: Activity: Goal: Ability to tolerate increased activity will improve Outcome: Progressing

## 2024-01-16 NOTE — ED Notes (Signed)
 At this time, this EDT provided ice water to pt. No other request's at this time, call light within reach.

## 2024-01-16 NOTE — Progress Notes (Signed)
 PROGRESS NOTE    Francisco Reed  FMW:[id number] DOB: 17-Jul-1960 DOA: 01/15/2024 PCP: Auston Reyes JONETTA, MD  Chief Complaint  Patient presents with   Chest Pain    Hospital Course:  Francisco Reed 63 year old male with chronic atrial fibrillation, chronic systolic dysfunction, OSA, osteoarthritis, who presented to the ED with acute onset midsternal chest pain and pressure and palpitations.  He was recently taken off his Eliquis  by PCP after sustaining a calf injury with large hematoma.  He has previously had 2 ablations for paroxysmal A-fib as well as 15 cardioversions.  On arrival to the ED heart rate was elevated 160s.  Cardiology was consulted.  Patient was started on scheduled metoprolol  every 6 hours as well as IV Cardizem .  He was also initiated again on Eliquis .  Subjective: This morning patient denies any chest pain or shortness of breath.  He reports he is feeling much better and he is anxious to discharge home.  We had extensive discussion about Eliquis .   Objective: Vitals:   01/16/24 1051 01/16/24 1105 01/16/24 1400 01/16/24 1550  BP: 116/87  106/70 99/75  Pulse: 95  81 97  Resp: (!) 23  (!) 26 18  Temp:  97.7 F (36.5 C)  98 F (36.7 C)  TempSrc:    Oral  SpO2: 96%  98% 97%  Weight:      Height:        Intake/Output Summary (Last 24 hours) at 01/16/2024 1636 Last data filed at 01/16/2024 1555 Gross per 24 hour  Intake 150 ml  Output --  Net 150 ml   Filed Weights   01/15/24 1751  Weight: 93 kg    Examination: General exam: Appears calm and comfortable, NAD  Respiratory system: No work of breathing, symmetric chest wall expansion Cardiovascular system: S1 & S2 heard, rapid irregular rhythm Gastrointestinal system: Abdomen is nondistended, soft and nontender.  Neuro: Alert and oriented. No focal neurological deficits. Extremities: Symmetric, expected ROM Skin: No rashes, lesions Psychiatry: Demonstrates appropriate judgement and insight. Mood &  affect appropriate for situation.   Assessment & Plan:  Principal Problem:   Atrial fibrillation with RVR (HCC) Active Problems:   Hypertensive urgency   Chest pain    A-fib RVR - Currently on Cardizem  drip and p.o. Cardizem .  Tapering off of Cardizem  drip per cardiology - Continue with Lopressor  and flecainide  per cardiology - Has been started on Eliquis  - CHA2DS2-VASc: 1.  Patient is hesitant about resuming Eliquis  - Last echocardiogram 2023, new echo has been ordered and pending - Follows in A-fib clinic - Cardiology following, appreciate further recommendations  Hypertensive urgency - Resolved now.  Blood pressure meds as above  Nonobstructive CAD - Troponin unremarkable during this admission - Cath 2019 with nonobstructive CAD  Prediabetes - Repeat hemoglobin A1c pending, has been over 1 year since tested.  Mild hyperglycemia on arrival  Body mass index is 29.41 kg/m. - Outpatient follow up for lifestyle modification and risk factor management   DVT prophylaxis: Eliquis  currently   Code Status: Full Code Disposition:  Inpatient pending clinical resolution   Consultants:  Treatment Team:  Consulting Physician: Darron Deatrice LABOR, MD  Procedures:    Antimicrobials:  Anti-infectives (From admission, onward)    None       Data Reviewed: I have personally reviewed following labs and imaging studies CBC: Recent Labs  Lab 01/15/24 1808 01/16/24 0447  WBC 7.2 7.1  HGB 13.5 13.7  HCT 41.1 40.8  MCV 91.1 89.9  PLT  228 234   Basic Metabolic Panel: Recent Labs  Lab 01/15/24 1808 01/15/24 1954 01/16/24 0447  NA 137  --  138  K 3.8  --  4.0  CL 103  --  104  CO2 22  --  26  GLUCOSE 121*  --  109*  BUN 19  --  18  CREATININE 0.87  --  0.78  CALCIUM 9.0  --  9.2  MG  --  2.0  --    GFR: Estimated Creatinine Clearance: 109.7 mL/min (by C-G formula based on SCr of 0.78 mg/dL). Liver Function Tests: No results for input(s): AST, ALT,  ALKPHOS, BILITOT, PROT, ALBUMIN in the last 168 hours. CBG: No results for input(s): GLUCAP in the last 168 hours.  No results found for this or any previous visit (from the past 240 hours).   Radiology Studies: DG Chest Port 1 View Result Date: 01/15/2024 CLINICAL DATA:  Chest pain beginning an hour prior to admission. EXAM: PORTABLE CHEST 1 VIEW COMPARISON:  12/22/2022 and older exams. FINDINGS: Cardiac silhouette is normal in size and configuration. No mediastinal or hilar masses. Clear lungs.  No pleural effusion or pneumothorax. Skeletal structures are grossly intact. IMPRESSION: No active disease. Electronically Signed   By: Alm Parkins M.D.   On: 01/15/2024 18:29    Scheduled Meds:  apixaban   5 mg Oral BID   ascorbic acid  2,000 mg Oral Daily   cholecalciferol  5,000 Units Oral q morning   diltiazem   30 mg Oral QID   flecainide   50 mg Oral BID   metoprolol  tartrate  25 mg Oral BID   potassium chloride  40 mEq Oral Once   Continuous Infusions:  diltiazem  (CARDIZEM ) infusion Stopped (01/16/24 1436)     LOS: 1 day  MDM: Patient is high risk for one or more organ failure.  They necessitate ongoing hospitalization for continued IV therapies and subsequent lab monitoring. Total time spent interpreting labs and vitals, reviewing the medical record, coordinating care amongst consultants and care team members, directly assessing and discussing care with the patient and/or family: 55 min  Kier Smead, DO [facility]  To contact the attending physician between 7A-7P please use Epic Chat. To contact the covering physician during after hours 7P-7A, please review Amion.  01/16/2024, 4:36 PM   *This document has been created with the assistance of dictation software. Please excuse typographical errors. *

## 2024-01-16 NOTE — Progress Notes (Signed)
 Patient remains in afib HR persistently increasing 110's-130's, Dr Leesa Notified, orders modified to give scheduled metoprolol  and flecainide  early.

## 2024-01-16 NOTE — ED Notes (Signed)
 Advised nurse that patient has ready bed

## 2024-01-16 NOTE — Consult Note (Addendum)
 Cardiology Consultation   Patient ID: Francisco Reed MRN: [id number]; DOB: [birth year]  Admit date: 01/15/2024 Date of Consult: 01/16/2024  PCP:  Auston Reyes JONETTA, MD   [facility] Providers Cardiologist:  None    Patient Profile: Francisco Reed is a 63 y.o. male with a hx of persistent A-fib s/p 2 ablations, nonobstructive CAD who is being seen 01/16/2024 for the evaluation of A-fib RVR at the request of Dr. Leesa.  History of Present Illness: Mr. Ruan is followed by EP in the A-fib clinic.  Patient has a history with persistent A-fib s/p multiple cardioversions and 2 ablations (most recent ablation 05/2017) with return of A-fib.  He was placed on flecainide  50 mg twice daily, return to prior beta-blocker dose and restarted on Eliquis .  He self converted on flecainide  after about 4 days.  He was seen back in the office in sinus rhythm.  In 2019 the patient reported chest pain and underwent Myoview  Lexi scan which returned as high risk with a reversible defect of moderate severity.  EF was reportedly normal.  He was set up for cardiac cath which showed proximal LAD lesion of 35%, normal LVEDP, normal right heart pressures, normal cardiac output.  Recommended for medical management.  Echo showed LVEF 50 to 55%, ascending aortic dilation of 43 mm, trivial MR.  Echo in 2023 showed EF 50 to 55%, normal diastolic parameters, mild MR, trivial AI.  Patient was last seen 07/31/2023 by the A-fib clinic.  He was in normal sinus rhythm patient wished to try and stop flecainide .  He was continued on Eliquis .  The patient presented to the ER 01/15/2024 with chest pain.  He stated it felt like a prior episode of A-fib.  Patient reports he stopped flecainide  and Eliquis  5 months ago due to hematoma in his calf.   In the ER blood pressure 157/132, pulse rate 42 bpm, afebrile, respiratory rate 18, 99% O2.  Labs show high-sensitivity troponin negative x 2.  proBNP less than 50.  Other labs  unremarkable.  EKG showed A-fib, 157 bpm.  Chest x-ray showed no focal consolidation or edema.  The patient was started on IV Dilt and admitted for further workup.   Past Medical History:  Diagnosis Date   A-fib [facility])    Arthritis    hands   Chronic systolic dysfunction of left ventricle    Obstructive sleep apnea    mild   Persistent atrial fibrillation (HCC)    Wears hearing aid in both ears    12/11/22.  Currently non-functional    Past Surgical History:  Procedure Laterality Date   ATRIAL FIBRILLATION ABLATION N/A 05/29/2017   Procedure: ATRIAL FIBRILLATION ABLATION;  Surgeon: Kelsie Agent, MD;  Location: [facility] INVASIVE CV LAB;  Service: Cardiovascular;  Laterality: N/A;   CARDIAC CATHETERIZATION     CARDIOVERSION     CARDIOVERSION N/A 12/24/2016   Procedure: CARDIOVERSION;  Surgeon: Delford Maude BROCKS, MD;  Location: [facility] ENDOSCOPY;  Service: Cardiovascular;  Laterality: N/A;   CARDIOVERSION N/A 03/20/2017   Procedure: CARDIOVERSION;  Surgeon: Okey Vina GAILS, MD;  Location: [facility] ENDOSCOPY;  Service: Cardiovascular;  Laterality: N/A;   CATARACT EXTRACTION W/PHACO Right 12/18/2022   Procedure: CATARACT EXTRACTION PHACO AND INTRAOCULAR LENS PLACEMENT (IOC) RIGHT 5.39 00:32.8;  Surgeon: Enola Feliciano Hugger, MD;  Location: [facility];  Service: Ophthalmology;  Laterality: Right;   COLONOSCOPY WITH PROPOFOL  N/A 12/10/2018   Procedure: COLONOSCOPY WITH PROPOFOL ;  Surgeon: Therisa Bi, MD;  Location: [facility] ENDOSCOPY;  Service: Gastroenterology;  Laterality: N/A;   ELECTROPHYSIOLOGIC STUDY N/A 12/28/2014   Procedure: CARDIOVERSION;  Surgeon: Wolm JINNY Rhyme, MD;  Location: [facility] ORS;  Service: Cardiovascular;  Laterality: N/A;   ELECTROPHYSIOLOGIC STUDY N/A 09/07/2015   Procedure: CARDIOVERSION;  Surgeon: Wolm JINNY Rhyme, MD;  Location: [facility] ORS;  Service: Cardiovascular;  Laterality: N/A;   ELECTROPHYSIOLOGIC STUDY N/A 03/18/2016   Procedure: Atrial Fibrillation Ablation;  Surgeon: Lynwood Rakers, MD;  Location: [facility] INVASIVE CV LAB;  Service: Cardiovascular;  Laterality: N/A;   RIGHT/LEFT HEART CATH AND CORONARY ANGIOGRAPHY N/A 08/06/2017   Procedure: RIGHT/LEFT HEART CATH AND CORONARY ANGIOGRAPHY;  Surgeon: Jordan, Peter M, MD;  Location: [facility] INVASIVE CV LAB;  Service: Cardiovascular;  Laterality: N/A;   TEE WITHOUT CARDIOVERSION N/A 09/07/2015   Procedure: TRANSESOPHAGEAL ECHOCARDIOGRAM (TEE);  Surgeon: Wolm JINNY Rhyme, MD;  Location: [facility] ORS;  Service: Cardiovascular;  Laterality: N/A;   TEE WITHOUT CARDIOVERSION N/A 03/17/2016   Procedure: TRANSESOPHAGEAL ECHOCARDIOGRAM (TEE);  Surgeon: Redell GORMAN Shallow, MD;  Location: [facility] ENDOSCOPY;  Service: Cardiovascular;  Laterality: N/A;   TEE WITHOUT CARDIOVERSION N/A 12/24/2016   Procedure: TRANSESOPHAGEAL ECHOCARDIOGRAM (TEE);  Surgeon: Delford Maude BROCKS, MD;  Location: [facility] ENDOSCOPY;  Service: Cardiovascular;  Laterality: N/A;   TEE WITHOUT CARDIOVERSION N/A 05/29/2017   Procedure: TRANSESOPHAGEAL ECHOCARDIOGRAM (TEE);  Surgeon: Shallow Redell GORMAN, MD;  Location: [facility] ENDOSCOPY;  Service: Cardiovascular;  Laterality: N/A;     Home Medications:  Prior to Admission medications   Medication Sig Start Date End Date Taking? Authorizing Provider  acetaminophen  (TYLENOL ) 500 MG tablet Take 1,000 mg by mouth as needed for mild pain (pain score 1-3) or moderate pain (pain score 4-6).   Yes [provider]  ascorbic acid  (VITAMIN C ) 1000 MG tablet Take 1,000 mg by mouth 2 (two) times daily.   Yes [provider]  Biotin  10 MG CAPS Take 1 capsule by mouth every evening.   Yes [provider]  Cholecalciferol  (VITAMIN D -3 PO) Take 1 tablet by mouth every morning.   Yes [provider]  finasteride  (PROPECIA ) 1 MG tablet Take 1 tablet by mouth daily. 04/18/20  Yes [provider]  tadalafil  (CIALIS ) 5 MG tablet Take 1 tablet (5 mg total) by mouth daily as needed for erectile dysfunction. 08/05/18  Yes Allred, Lynwood,  MD  oxyCODONE -acetaminophen  (PERCOCET) 5-325 MG tablet Take 1 tablet by mouth every 4 (four) hours as needed. Patient not taking: Reported on 01/15/2024 08/14/23 08/13/24  Willo Dunnings, MD    Scheduled Meds:  apixaban   5 mg Oral BID   ascorbic acid   2,000 mg Oral Daily   cholecalciferol   5,000 Units Oral q morning   metoprolol  tartrate  25 mg Oral Q6H   potassium chloride   40 mEq Oral Once   Continuous Infusions:  diltiazem  (CARDIZEM ) infusion 15 mg/hr (01/16/24 0350)   PRN Meds: acetaminophen , ALPRAZolam , magnesium  hydroxide, ondansetron  (ZOFRAN ) IV, oxyCODONE -acetaminophen , traZODone   Allergies:   No Known Allergies  Social History:   Social History   Socioeconomic History   Marital status: Married    Spouse name: Not on file   Number of children: Not on file   Years of education: Not on file   Highest education level: Not on file  Occupational History   Not on file  Tobacco Use   Smoking status: Never   Smokeless tobacco: Never  Vaping Use   Vaping status: Never Used  Substance and Sexual Activity   Alcohol use: No   Drug use: No  Sexual activity: Not on file  Other Topics Concern   Not on file  Social History Narrative   Pt lives in [place] with spouse.  Works as a surveyor, minerals.   Social Drivers of Corporate Investment Banker Strain: Low Risk  (08/20/2023)   Received from [facility]   Overall Financial Resource Strain (CARDIA)    Difficulty of Paying Living Expenses: Not very hard  Food Insecurity: No Food Insecurity (08/20/2023)   Received from [facility]   Hunger Vital Sign    Within the past 12 months, you worried that your food would run out before you got the money to buy more.: Never true    Within the past 12 months, the food you bought just didn't last and you didn't have money to get more.: Never true  Transportation Needs: No Transportation Needs (08/20/2023)   Received from [facility] - Transportation    In the past 12 months, has lack of transportation kept you from medical appointments or from getting medications?: No    Lack of Transportation (Non-Medical): No  Physical Activity: Not on file  Stress: Not on file  Social Connections: Not on file  Intimate Partner Violence: Not on file    Family History:   History reviewed. No pertinent family history.   ROS:  Please see the history of present illness.   All other ROS reviewed and negative.     Physical Exam/Data: Vitals:   01/16/24 0600 01/16/24 0630 01/16/24 0700 01/16/24 0704  BP: 110/73 (!) 107/93 93/66   Pulse: 99 87 (!) 103   Resp: 20 15 13    Temp:  98 F (36.7 C)    TempSrc:  Oral    SpO2: 96% 95% 96% 99%  Weight:      Height:       No intake or output data in the 24 hours ending 01/16/24 0712    01/15/2024    5:51 PM 08/14/2023    1:28 PM 08/14/2023   11:53 AM  Last 3 Weights  Weight (lbs) 205 lb 205 lb 207 lb 3.7 oz  Weight (kg) 92.987 kg 92.987 kg 94 kg     Body mass index is 29.41 kg/m.  General:  Well nourished, well developed, in no acute distress HEENT: normal Neck: no JVD Vascular: No carotid bruits; Distal pulses 2+ bilaterally Cardiac:  normal S1, S2; Irreg Irreg; no murmur  Lungs:  clear to auscultation bilaterally, no wheezing, rhonchi or rales  Abd: soft, nontender, no hepatomegaly  Ext: no edema Musculoskeletal:  No deformities, BUE and BLE strength normal and equal Skin: warm and dry  Neuro:  CNs 2-12 intact, no focal abnormalities noted Psych:  Normal affect   EKG:  The EKG was personally reviewed and demonstrates:  A-fib, 157 bpm Telemetry:  Telemetry was personally reviewed and demonstrates:  Afib HR 90-110bpm  Relevant CV Studies:  ECHO 03/11/21:  1. Left ventricular ejection fraction, by estimation, is 50 to 55%. The  left ventricle has low normal function. The left ventricle has no regional  wall motion abnormalities. The left ventricular internal  cavity size was  mildly dilated. Left ventricular  diastolic parameters were normal.   2. Right ventricular systolic function is normal. The right ventricular  size is normal.   3. The mitral valve is normal in structure. Mild mitral valve  regurgitation.   4. The aortic valve is normal in structure. Aortic valve regurgitation is  trivial.   5. Aortic dilatation noted. There is borderline dilatation of the aortic  root, measuring 38 mm. There is mild dilatation of the ascending aorta,  measuring 42 mm.   Echo 11/2017 Study Conclusions   - Left ventricle: The cavity size was moderately dilated. Systolic    function was normal. The estimated ejection fraction was in the    range of 50% to 55%. Wall motion was normal; there were no    regional wall motion abnormalities. Left ventricular diastolic    function parameters were normal with indeterminate filling    pressures.  - Aortic valve: Trileaflet; normal thickness leaflets.    Transvalvular velocity was within the normal range. There was no    stenosis. There was trivial regurgitation.  - Ascending aorta: The ascending aorta was dilated, measuring 43 mm    at the proximal ascending aorta (when indexed to body surface    area and age).  - Mitral valve: Structurally normal valve. There was trivial    regurgitation.  - Left atrium: The atrium was normal in size.  - Right lower pulmonary vein: The Doppler velocity and flow profile    were normal. No evidence of pulmonary vein stenosis.  - Right ventricle: The cavity size was normal. Wall thickness was    normal. Systolic function was mildly reduced.  - Tricuspid valve: Structurally normal valve. There was trivial    regurgitation.  - Inferior vena cava: The vessel was mildly dilated (21 mm)  - Pericardium, extracardiac: There was no pericardial effusion.   R/L heart cath 07/2017 Prox LAD lesion is 35% stenosed. The left ventricular systolic function is normal. LV end diastolic  pressure is normal. The left ventricular ejection fraction is 50-55% by visual estimate.   1. Nonobstructive CAD 2. Low normal LV function. EF 50-55% 3. Normal LV filling pressures.  4. Normal right heart pressures. 5. Normal cardiac output.   Plan: medical management.   Laboratory Data: High Sensitivity Troponin:  No results for input(s): TROPONINIHS in the last 720 hours.   Chemistry Recent Labs  Lab 01/15/24 1808 01/15/24 1954 01/16/24 0447  NA 137  --  138  K 3.8  --  4.0  CL 103  --  104  CO2 22  --  26  GLUCOSE 121*  --  109*  BUN 19  --  18  CREATININE 0.87  --  0.78  CALCIUM 9.0  --  9.2  MG  --  2.0  --   GFRNONAA >60  --  >60  ANIONGAP 12  --  9    No results for input(s): PROT, ALBUMIN, AST, ALT, ALKPHOS, BILITOT in the last 168 hours. Lipids No results for input(s): CHOL, TRIG, HDL, LABVLDL, LDLCALC, CHOLHDL in the last 168 hours.  Hematology Recent Labs  Lab 01/15/24 1808 01/16/24 0447  WBC 7.2 7.1  RBC 4.51 4.54  HGB 13.5 13.7  HCT 41.1 40.8  MCV 91.1 89.9  MCH 29.9 30.2  MCHC 32.8 33.6  RDW 13.0 13.0  PLT 228 234   Thyroid No results for input(s): TSH, FREET4 in the last 168 hours.  BNP Recent Labs  Lab 01/15/24 1808  PROBNP <50.0    DDimer No results for input(s): DDIMER in the last 168 hours.  Radiology/Studies:  DG Chest Port 1 View Result Date: 01/15/2024 CLINICAL DATA:  Chest pain beginning an hour prior to admission. EXAM: PORTABLE CHEST 1 VIEW COMPARISON:  12/22/2022 and older exams. FINDINGS: Cardiac silhouette is normal in  size and configuration. No mediastinal or hilar masses. Clear lungs.  No pleural effusion or pneumothorax. Skeletal structures are grossly intact. IMPRESSION: No active disease. Electronically Signed   By: Alm Parkins M.D.   On: 01/15/2024 18:29     Assessment and Plan:  Afib RVR H/o persistent Afib s/p multiple DCCVs and ablation x2 - Patient presents with chest pain found  to be in rapid A-fib started on IV Cardizem  drip -Patient regularly followed with EP/A-fib clinic.  He reports he stopped Eliquis  and flecainide  5 months ago due to calf hematoma and low CHADSVASC - Continued on Lopressor  25 mg every 6 hours>can consolidate  - CHA2DS2-VASc of 1, placed back on Eliquis  5mg BID - can restart on flecainide  50mg  bIDand follow up with Afib clinic. Md to see. Wean IV dilt as able  Hypertensive urgency - IV Cardizem > wean as able - BP soft  Nonobstructive CAD - Troponin negative x 2 - Cath in 2019 showed nonobstructive CAD   For questions or updates, please contact [facility] Please consult [email or web address] for contact info under      Signed, Laterria Lasota VEAR Fishman, PA-C  01/16/2024 7:12 AM

## 2024-01-17 ENCOUNTER — Inpatient Hospital Stay: Admit: 2024-01-17 | Discharge: 2024-01-17 | Disposition: A | Attending: Family Medicine

## 2024-01-17 DIAGNOSIS — I4891 Unspecified atrial fibrillation: Secondary | ICD-10-CM | POA: Diagnosis not present

## 2024-01-17 DIAGNOSIS — I509 Heart failure, unspecified: Secondary | ICD-10-CM | POA: Diagnosis not present

## 2024-01-17 LAB — ECHOCARDIOGRAM COMPLETE
AR max vel: 1.58 cm2
AV Area VTI: 1.43 cm2
AV Area mean vel: 1.5 cm2
AV Mean grad: 6 mmHg
AV Peak grad: 11.3 mmHg
Ao pk vel: 1.68 m/s
Height: 70 in
S' Lateral: 3.6 cm
Weight: 3280 [oz_av]

## 2024-01-17 LAB — POTASSIUM: Potassium: 4.2 mmol/L (ref 3.5–5.1)

## 2024-01-17 MED ORDER — SODIUM CHLORIDE 0.9 % IV SOLN: INTRAVENOUS | Status: DC

## 2024-01-17 MED ORDER — DILTIAZEM HCL 30 MG PO TABS
60.0000 mg | ORAL_TABLET | Freq: Four times a day (QID) | ORAL | Status: DC
  Administered 2024-01-17 – 2024-01-18 (×4): 60 mg via ORAL
  Filled 2024-01-17 (×4): qty 2

## 2024-01-17 MED ORDER — METOPROLOL TARTRATE 5 MG/5ML IV SOLN
5.0000 mg | Freq: Once | INTRAVENOUS | Status: AC
  Administered 2024-01-17: 5 mg via INTRAVENOUS
  Filled 2024-01-17: qty 5

## 2024-01-17 NOTE — Progress Notes (Signed)
 Rounding Note   Patient Name: Francisco Reed Date of Encounter: 01/17/2024  [facility] Cardiologist: None   Subjective Denies chest pain or palpitations.  Still on Cardizem  drip.  Scheduled Meds:  apixaban   5 mg Oral BID   ascorbic acid  2,000 mg Oral Daily   cholecalciferol  5,000 Units Oral q morning   diltiazem   60 mg Oral QID   flecainide   50 mg Oral BID   metoprolol  tartrate  25 mg Oral BID   potassium chloride  40 mEq Oral Once   Continuous Infusions:  diltiazem  (CARDIZEM ) infusion 10 mg/hr (01/17/24 0958)   PRN Meds: acetaminophen , ALPRAZolam, magnesium hydroxide, ondansetron  (ZOFRAN ) IV, oxyCODONE -acetaminophen , traZODone   Vital Signs  Vitals:   01/17/24 1429 01/17/24 1500 01/17/24 1528 01/17/24 1600  BP: 121/81  97/84 110/85  Pulse:      Resp: (!) 21 (!) 31 19 18   Temp:   97.8 F (36.6 C)   TempSrc:   Oral   SpO2:   95%   Weight:      Height:        Intake/Output Summary (Last 24 hours) at 01/17/2024 1633 Last data filed at 01/17/2024 1413 Gross per 24 hour  Intake 180 ml  Output 725 ml  Net -545 ml      01/15/2024    5:51 PM 08/14/2023    1:28 PM 08/14/2023   11:53 AM  Last 3 Weights  Weight (lbs) 205 lb 205 lb 207 lb 3.7 oz  Weight (kg) 92.987 kg 92.987 kg 94 kg      Telemetry Atrial fibrillation, heart rate 107- Personally Reviewed  ECG   - Personally Reviewed  Physical Exam  GEN: No acute distress.   Neck: No JVD Cardiac: Irregular irregular Respiratory: Clear to auscultation bilaterally. GI: Soft, nontender, non-distended  MS: No edema; No deformity. Neuro:  Nonfocal  Psych: Normal affect   Labs High Sensitivity Troponin:  No results for input(s): TROPONINIHS in the last 720 hours.   Chemistry Recent Labs  Lab 01/15/24 1808 01/15/24 1954 01/16/24 0447 01/17/24 1610  NA 137  --  138  --   K 3.8  --  4.0 4.2  CL 103  --  104  --   CO2 22  --  26  --   GLUCOSE 121*  --  109*  --   BUN 19  --  18  --    CREATININE 0.87  --  0.78  --   CALCIUM 9.0  --  9.2  --   MG  --  2.0  --   --   GFRNONAA >60  --  >60  --   ANIONGAP 12  --  9  --     Lipids No results for input(s): CHOL, TRIG, HDL, LABVLDL, LDLCALC, CHOLHDL in the last 168 hours.  Hematology Recent Labs  Lab 01/15/24 1808 01/16/24 0447  WBC 7.2 7.1  RBC 4.51 4.54  HGB 13.5 13.7  HCT 41.1 40.8  MCV 91.1 89.9  MCH 29.9 30.2  MCHC 32.8 33.6  RDW 13.0 13.0  PLT 228 234   Thyroid No results for input(s): TSH, FREET4 in the last 168 hours.  BNP Recent Labs  Lab 01/15/24 1808  PROBNP <50.0    DDimer No results for input(s): DDIMER in the last 168 hours.   Radiology  ECHOCARDIOGRAM COMPLETE Result Date: 01/17/2024    ECHOCARDIOGRAM REPORT   Patient Name:   Francisco Reed Date of Exam: 01/17/2024 Medical  Rec #:  [id number]        Height:       70.0 in Accession #:    [id number]       Weight:       205.0 lb Date of Birth:  10/07/1960        BSA:          2.109 m Patient Age:    63 years         BP:           99/73 mmHg Patient Gender: M                HR:           161 bpm. Exam Location:  [facility] Procedure: 2D Echo, Cardiac Doppler and Color Doppler (Both Spectral and Color            Flow Doppler were utilized during procedure). Indications:     I50.20* Unspecified systolic (congestive) heart failure  History:         Patient has prior history of Echocardiogram examinations, most                  recent 03/11/2021. Cardiomyopathy, Abnormal ECG,                  Arrythmias:Tachycardia and Atrial Fibrillation,                  Signs/Symptoms:Edema and Chest Pain; Risk Factors:Dyslipidemia                  and Non-Smoker.  Sonographer:     Doyal Point MHA, BS, RDCS Referring Phys:  [id number] LORANE POLAND Diagnosing Phys: Redell Cave MD IMPRESSIONS  1. Left ventricular ejection fraction, by estimation, is 50 to 55%. The left ventricle has low normal function. The left ventricle demonstrates global hypokinesis.  There is mild left ventricular hypertrophy. Left ventricular diastolic parameters are indeterminate.  2. Right ventricular systolic function is normal. The right ventricular size is normal.  3. The mitral valve is normal in structure. Mild mitral valve regurgitation.  4. The aortic valve is tricuspid. Aortic valve regurgitation is mild. FINDINGS  Left Ventricle: Left ventricular ejection fraction, by estimation, is 50 to 55%. The left ventricle has low normal function. The left ventricle demonstrates global hypokinesis. The left ventricular internal cavity size was normal in size. There is mild left ventricular hypertrophy. Left ventricular diastolic parameters are indeterminate. Right Ventricle: The right ventricular size is normal. No increase in right ventricular wall thickness. Right ventricular systolic function is normal. Left Atrium: Left atrial size was normal in size. Right Atrium: Right atrial size was normal in size. Pericardium: There is no evidence of pericardial effusion. Mitral Valve: The mitral valve is normal in structure. Mild mitral valve regurgitation. Tricuspid Valve: The tricuspid valve is normal in structure. Tricuspid valve regurgitation is mild. Aortic Valve: The aortic valve is tricuspid. Aortic valve regurgitation is mild. Aortic valve mean gradient measures 6.0 mmHg. Aortic valve peak gradient measures 11.3 mmHg. Aortic valve area, by VTI measures 1.43 cm. Pulmonic Valve: The pulmonic valve was not well visualized. Pulmonic valve regurgitation is not visualized. Aorta: The aortic root is normal in size and structure. Venous: The inferior vena cava was not well visualized. IAS/Shunts: No atrial level shunt detected by color flow Doppler.  LEFT VENTRICLE PLAX 2D LVIDd:         4.10 cm LVIDs:         3.60 cm LV  PW:         1.30 cm LV IVS:        1.40 cm LVOT diam:     2.10 cm LV SV:         33 LV SV Index:   16 LVOT Area:     3.46 cm  RIGHT VENTRICLE RV Basal diam:  3.90 cm RV Mid diam:     3.10 cm LEFT ATRIUM           Index        RIGHT ATRIUM           Index LA diam:      3.80 cm 1.80 cm/m   RA Area:     16.80 cm LA Vol (A2C): 40.6 ml 19.25 ml/m  RA Volume:   40.30 ml  19.11 ml/m LA Vol (A4C): 50.0 ml 23.70 ml/m  AORTIC VALVE AV Area (Vmax):    1.58 cm AV Area (Vmean):   1.50 cm AV Area (VTI):     1.43 cm AV Vmax:           168.00 cm/s AV Vmean:          117.000 cm/s AV VTI:            0.232 m AV Peak Grad:      11.3 mmHg AV Mean Grad:      6.0 mmHg LVOT Vmax:         76.50 cm/s LVOT Vmean:        50.800 cm/s LVOT VTI:          0.096 m LVOT/AV VTI ratio: 0.41  AORTA Ao Root diam: 3.80 cm Ao Asc diam:  3.40 cm  SHUNTS Systemic VTI:  0.10 m Systemic Diam: 2.10 cm Redell Cave MD Electronically signed by Redell Cave MD Signature Date/Time: 01/17/2024/11:23:46 AM    Final    DG Chest Port 1 View Result Date: 01/15/2024 CLINICAL DATA:  Chest pain beginning an hour prior to admission. EXAM: PORTABLE CHEST 1 VIEW COMPARISON:  12/22/2022 and older exams. FINDINGS: Cardiac silhouette is normal in size and configuration. No mediastinal or hilar masses. Clear lungs.  No pleural effusion or pneumothorax. Skeletal structures are grossly intact. IMPRESSION: No active disease. Electronically Signed   By: Alm Parkins M.D.   On: 01/15/2024 18:29    Cardiac Studies Echo EF 50 to 55%  Patient Profile   63 y.o. male with history of persistent atrial fibrillation s/p ablation x 2 (last in 05/2017), presenting with chest pain and palpitations.  Seen for A-fib RVR.   Assessment & Plan  A-fib RVR - Tachycardia, increased renal cardiogram conservative medical history states.  Titrate Cardizem  drip as BP permits. - Continue Lopressor  25 twice daily, flecainide  50 mg twice daily. - Eliquis  5 mg twice daily - Plan TEE guided cardioversion tomorrow if patient still in A-fib. - Patient maintained sinus rhythm while on flecainide  in the past.  Flecainide , Eliquis  was discontinued 5 months  ago.  Signed, Redell Cave, MD  01/17/2024, 4:33 PM

## 2024-01-17 NOTE — Progress Notes (Signed)
 PROGRESS NOTE    Francisco Reed  FMW:[id number] DOB: 1960-08-01 DOA: 01/15/2024 PCP: Auston Reyes JONETTA, MD  Chief Complaint  Patient presents with   Chest Pain    Hospital Course:  [name] 63 year old male with chronic atrial fibrillation, chronic systolic dysfunction, OSA, osteoarthritis, who presented to the ED with acute onset midsternal chest pain and pressure and palpitations.  He was recently taken off his Eliquis  by PCP after sustaining a calf injury with large hematoma.  He has previously had 2 ablations for paroxysmal A-fib as well as 15 cardioversions.  On arrival to the ED heart rate was elevated 160s.  Cardiology was consulted.  Patient was started on scheduled metoprolol  every 6 hours as well as IV Cardizem .  He was also initiated again on Eliquis .  Rate gradually improved but worsened again overnight on 11/30.  Subjective: Persistent RVR overnight up into the 130s.  Dilt drip was stopped yesterday, have resumed this a.m.  Patient denies any chest pain.   Objective: Vitals:   01/17/24 1424 01/17/24 1429 01/17/24 1500 01/17/24 1528  BP:  121/81  97/84  Pulse:      Resp: 17 (!) 21 (!) 31 19  Temp:    97.8 F (36.6 C)  TempSrc:    Oral  SpO2:    95%  Weight:      Height:        Intake/Output Summary (Last 24 hours) at 01/17/2024 1535 Last data filed at 01/17/2024 1413 Gross per 24 hour  Intake 330 ml  Output 725 ml  Net -395 ml   Filed Weights   01/15/24 1751  Weight: 93 kg    Examination: General exam: Appears calm and comfortable, NAD  Respiratory system: No work of breathing, symmetric chest wall expansion Cardiovascular system: S1 & S2 heard, rapid irregular rhythm Gastrointestinal system: Abdomen is nondistended, soft and nontender.  Neuro: Alert and oriented. No focal neurological deficits. Extremities: Symmetric, expected ROM Skin: No rashes, lesions Psychiatry: Demonstrates appropriate judgement and insight. Mood & affect appropriate  for situation.   Assessment & Plan:  Principal Problem:   Atrial fibrillation with RVR (HCC) Active Problems:   Hypertensive urgency   Chest pain    A-fib RVR - Reinitiated Cardizem  drip today.  Continue with p.o. Cardizem , Lopressor  and flecainide . - Cardiology titrating p.o. medications.  Continue to wean off of Cardizem  as rate becomes better controlled - Patient was initiated on Eliquis  this admission.  Has been on it in the past - CHA2DS2-VASc:1 patient was reasonably taken off of Eliquis  outpatient.  He does however have recurrent A-fib RVR which has been historically difficult to treat.  We did discuss initiating antiarrhythmic therapy but appears this was limited earlier in his hospitalization as he had not been on anticoagulation.  He is considering continue Eliquis  for now in the event of future hospitalizations - Echocardiogram this admission: EF 50 to 55%, left ventricle demonstrates global hypokinesis, mild left ventricular hypertrophy, diastolic parameters indeterminate. - Follows in A-fib clinic - Cardiology following, planning for TEE/DCCV in a.m.  Nonobstructive CAD - Troponin unremarkable during this admission - Cath 2019 with nonobstructive CAD  Prediabetes - Repeat hemoglobin A1c pending, has been over 1 year since tested.  Mild hyperglycemia on arrival - No indication for sliding scale at this time  Body mass index is 29.41 kg/m. - Outpatient follow up for lifestyle modification and risk factor management  OSA - Patient reports he does not use a CPAP at night  Osteoarthritis -stable.  PRN tylenol    DVT prophylaxis: Eliquis  currently   Code Status: Full Code Disposition: Planned for TEE/DCCV in a.m.  Consultants:  Treatment Team:  Consulting Physician: Darron Deatrice LABOR, MD  Procedures:    Antimicrobials:  Anti-infectives (From admission, onward)    None       Data Reviewed: I have personally reviewed following labs and imaging  studies CBC: Recent Labs  Lab 01/15/24 1808 01/16/24 0447  WBC 7.2 7.1  HGB 13.5 13.7  HCT 41.1 40.8  MCV 91.1 89.9  PLT 228 234   Basic Metabolic Panel: Recent Labs  Lab 01/15/24 1808 01/15/24 1954 01/16/24 0447  NA 137  --  138  K 3.8  --  4.0  CL 103  --  104  CO2 22  --  26  GLUCOSE 121*  --  109*  BUN 19  --  18  CREATININE 0.87  --  0.78  CALCIUM 9.0  --  9.2  MG  --  2.0  --    GFR: Estimated Creatinine Clearance: 109.7 mL/min (by C-G formula based on SCr of 0.78 mg/dL). Liver Function Tests: No results for input(s): AST, ALT, ALKPHOS, BILITOT, PROT, ALBUMIN in the last 168 hours. CBG: No results for input(s): GLUCAP in the last 168 hours.  No results found for this or any previous visit (from the past 240 hours).   Radiology Studies: ECHOCARDIOGRAM COMPLETE Result Date: 01/17/2024    ECHOCARDIOGRAM REPORT   Patient Name:   Francisco Reed Date of Exam: 01/17/2024 Medical Rec #:  [id number]        Height:       70.0 in Accession #:    [id number]       Weight:       205.0 lb Date of Birth:  Apr 03, 1960        BSA:          2.109 m Patient Age:    63 years         BP:           99/73 mmHg Patient Gender: M                HR:           161 bpm. Exam Location:  [facility] Procedure: 2D Echo, Cardiac Doppler and Color Doppler (Both Spectral and Color            Flow Doppler were utilized during procedure). Indications:     I50.20* Unspecified systolic (congestive) heart failure  History:         Patient has prior history of Echocardiogram examinations, most                  recent 03/11/2021. Cardiomyopathy, Abnormal ECG,                  Arrythmias:Tachycardia and Atrial Fibrillation,                  Signs/Symptoms:Edema and Chest Pain; Risk Factors:Dyslipidemia                  and Non-Smoker.  Sonographer:     Doyal Point MHA, BS, RDCS Referring Phys:  [id number] LORANE POLAND Diagnosing Phys: Redell Cave MD IMPRESSIONS  1. Left ventricular ejection  fraction, by estimation, is 50 to 55%. The left ventricle has low normal function. The left ventricle demonstrates global hypokinesis. There is mild left ventricular hypertrophy. Left ventricular diastolic parameters are indeterminate.  2. Right ventricular systolic  function is normal. The right ventricular size is normal.  3. The mitral valve is normal in structure. Mild mitral valve regurgitation.  4. The aortic valve is tricuspid. Aortic valve regurgitation is mild. FINDINGS  Left Ventricle: Left ventricular ejection fraction, by estimation, is 50 to 55%. The left ventricle has low normal function. The left ventricle demonstrates global hypokinesis. The left ventricular internal cavity size was normal in size. There is mild left ventricular hypertrophy. Left ventricular diastolic parameters are indeterminate. Right Ventricle: The right ventricular size is normal. No increase in right ventricular wall thickness. Right ventricular systolic function is normal. Left Atrium: Left atrial size was normal in size. Right Atrium: Right atrial size was normal in size. Pericardium: There is no evidence of pericardial effusion. Mitral Valve: The mitral valve is normal in structure. Mild mitral valve regurgitation. Tricuspid Valve: The tricuspid valve is normal in structure. Tricuspid valve regurgitation is mild. Aortic Valve: The aortic valve is tricuspid. Aortic valve regurgitation is mild. Aortic valve mean gradient measures 6.0 mmHg. Aortic valve peak gradient measures 11.3 mmHg. Aortic valve area, by VTI measures 1.43 cm. Pulmonic Valve: The pulmonic valve was not well visualized. Pulmonic valve regurgitation is not visualized. Aorta: The aortic root is normal in size and structure. Venous: The inferior vena cava was not well visualized. IAS/Shunts: No atrial level shunt detected by color flow Doppler.  LEFT VENTRICLE PLAX 2D LVIDd:         4.10 cm LVIDs:         3.60 cm LV PW:         1.30 cm LV IVS:        1.40 cm LVOT  diam:     2.10 cm LV SV:         33 LV SV Index:   16 LVOT Area:     3.46 cm  RIGHT VENTRICLE RV Basal diam:  3.90 cm RV Mid diam:    3.10 cm LEFT ATRIUM           Index        RIGHT ATRIUM           Index LA diam:      3.80 cm 1.80 cm/m   RA Area:     16.80 cm LA Vol (A2C): 40.6 ml 19.25 ml/m  RA Volume:   40.30 ml  19.11 ml/m LA Vol (A4C): 50.0 ml 23.70 ml/m  AORTIC VALVE AV Area (Vmax):    1.58 cm AV Area (Vmean):   1.50 cm AV Area (VTI):     1.43 cm AV Vmax:           168.00 cm/s AV Vmean:          117.000 cm/s AV VTI:            0.232 m AV Peak Grad:      11.3 mmHg AV Mean Grad:      6.0 mmHg LVOT Vmax:         76.50 cm/s LVOT Vmean:        50.800 cm/s LVOT VTI:          0.096 m LVOT/AV VTI ratio: 0.41  AORTA Ao Root diam: 3.80 cm Ao Asc diam:  3.40 cm  SHUNTS Systemic VTI:  0.10 m Systemic Diam: 2.10 cm Redell Cave MD Electronically signed by Redell Cave MD Signature Date/Time: 01/17/2024/11:23:46 AM    Final    DG Chest Port 1 View Result Date: 01/15/2024 CLINICAL DATA:  Chest pain  beginning an hour prior to admission. EXAM: PORTABLE CHEST 1 VIEW COMPARISON:  12/22/2022 and older exams. FINDINGS: Cardiac silhouette is normal in size and configuration. No mediastinal or hilar masses. Clear lungs.  No pleural effusion or pneumothorax. Skeletal structures are grossly intact. IMPRESSION: No active disease. Electronically Signed   By: Alm Parkins M.D.   On: 01/15/2024 18:29    Scheduled Meds:  apixaban   5 mg Oral BID   ascorbic acid  2,000 mg Oral Daily   cholecalciferol  5,000 Units Oral q morning   diltiazem   60 mg Oral QID   flecainide   50 mg Oral BID   metoprolol  tartrate  25 mg Oral BID   potassium chloride  40 mEq Oral Once   Continuous Infusions:  diltiazem  (CARDIZEM ) infusion 10 mg/hr (01/17/24 0958)     LOS: 2 days  MDM: Patient is high risk for one or more organ failure.  They necessitate ongoing hospitalization for continued IV therapies and subsequent lab  monitoring. Total time spent interpreting labs and vitals, reviewing the medical record, coordinating care amongst consultants and care team members, directly assessing and discussing care with the patient and/or family: 55 min  Izaha Shughart, DO [facility]  To contact the attending physician between 7A-7P please use Epic Chat. To contact the covering physician during after hours 7P-7A, please review Amion.  01/17/2024, 3:35 PM   *This document has been created with the assistance of dictation software. Please excuse typographical errors. *

## 2024-01-17 NOTE — Plan of Care (Signed)

## 2024-01-17 NOTE — Plan of Care (Signed)
   Problem: Education: Goal: Knowledge of General Education information will improve Description: Including pain rating scale, medication(s)/side effects and non-pharmacologic comfort measures Outcome: Progressing   Problem: Clinical Measurements: Goal: Ability to maintain clinical measurements within normal limits will improve Outcome: Progressing Goal: Will remain free from infection Outcome: Progressing

## 2024-01-18 ENCOUNTER — Telehealth (HOSPITAL_COMMUNITY): Payer: Self-pay | Admitting: Pharmacy Technician

## 2024-01-18 ENCOUNTER — Inpatient Hospital Stay: Admitting: Anesthesiology

## 2024-01-18 ENCOUNTER — Other Ambulatory Visit (HOSPITAL_COMMUNITY): Payer: Self-pay

## 2024-01-18 ENCOUNTER — Other Ambulatory Visit: Payer: Self-pay

## 2024-01-18 ENCOUNTER — Encounter: Admission: EM | Disposition: A | Payer: Self-pay | Source: Home / Self Care | Attending: Family Medicine

## 2024-01-18 ENCOUNTER — Inpatient Hospital Stay: Admit: 2024-01-18 | Discharge: 2024-01-18 | Disposition: A | Attending: Medical

## 2024-01-18 DIAGNOSIS — I4819 Other persistent atrial fibrillation: Secondary | ICD-10-CM

## 2024-01-18 DIAGNOSIS — R079 Chest pain, unspecified: Secondary | ICD-10-CM

## 2024-01-18 DIAGNOSIS — I340 Nonrheumatic mitral (valve) insufficiency: Secondary | ICD-10-CM

## 2024-01-18 DIAGNOSIS — I361 Nonrheumatic tricuspid (valve) insufficiency: Secondary | ICD-10-CM

## 2024-01-18 DIAGNOSIS — G4733 Obstructive sleep apnea (adult) (pediatric): Secondary | ICD-10-CM

## 2024-01-18 DIAGNOSIS — I4891 Unspecified atrial fibrillation: Secondary | ICD-10-CM | POA: Diagnosis not present

## 2024-01-18 DIAGNOSIS — E782 Mixed hyperlipidemia: Secondary | ICD-10-CM

## 2024-01-18 HISTORY — PX: CARDIOVERSION: SHX1299

## 2024-01-18 HISTORY — PX: INTRAOPERATIVE TRANSESOPHAGEAL ECHOCARDIOGRAM: SHX5062

## 2024-01-18 LAB — CBC WITH DIFFERENTIAL/PLATELET
Abs Immature Granulocytes: 0.05 K/uL (ref 0.00–0.07)
Basophils Absolute: 0 K/uL (ref 0.0–0.1)
Basophils Relative: 1 %
Eosinophils Absolute: 0.2 K/uL (ref 0.0–0.5)
Eosinophils Relative: 2 %
HCT: 42.3 % (ref 39.0–52.0)
Hemoglobin: 13.9 g/dL (ref 13.0–17.0)
Immature Granulocytes: 1 %
Lymphocytes Relative: 34 %
Lymphs Abs: 2.6 K/uL (ref 0.7–4.0)
MCH: 29.8 pg (ref 26.0–34.0)
MCHC: 32.9 g/dL (ref 30.0–36.0)
MCV: 90.8 fL (ref 80.0–100.0)
Monocytes Absolute: 0.8 K/uL (ref 0.1–1.0)
Monocytes Relative: 11 %
Neutro Abs: 3.8 K/uL (ref 1.7–7.7)
Neutrophils Relative %: 51 %
Platelets: 249 K/uL (ref 150–400)
RBC: 4.66 MIL/uL (ref 4.22–5.81)
RDW: 13.2 % (ref 11.5–15.5)
WBC: 7.5 K/uL (ref 4.0–10.5)
nRBC: 0 % (ref 0.0–0.2)

## 2024-01-18 LAB — COMPREHENSIVE METABOLIC PANEL WITH GFR
ALT: 37 U/L (ref 0–44)
AST: 30 U/L (ref 15–41)
Albumin: 3.9 g/dL (ref 3.5–5.0)
Alkaline Phosphatase: 75 U/L (ref 38–126)
Anion gap: 11 (ref 5–15)
BUN: 18 mg/dL (ref 8–23)
CO2: 24 mmol/L (ref 22–32)
Calcium: 8.9 mg/dL (ref 8.9–10.3)
Chloride: 104 mmol/L (ref 98–111)
Creatinine, Ser: 0.9 mg/dL (ref 0.61–1.24)
GFR, Estimated: >60 mL/min (ref >60)
Glucose, Bld: 93 mg/dL (ref 70–99)
Potassium: 4 mmol/L (ref 3.5–5.1)
Sodium: 139 mmol/L (ref 135–145)
Total Bilirubin: 0.3 mg/dL (ref 0.0–1.2)
Total Protein: 6.9 g/dL (ref 6.5–8.1)

## 2024-01-18 LAB — PHOSPHORUS: Phosphorus: 3.7 mg/dL (ref 2.5–4.6)

## 2024-01-18 LAB — TSH: TSH: 1.5 u[IU]/mL (ref 0.350–4.500)

## 2024-01-18 LAB — MAGNESIUM: Magnesium: 2.2 mg/dL (ref 1.7–2.4)

## 2024-01-18 LAB — HEMOGLOBIN A1C
Hgb A1c MFr Bld: 6.1 % — ABNORMAL HIGH (ref 4.8–5.6)
Mean Plasma Glucose: 128 mg/dL

## 2024-01-18 LAB — ECHO TEE

## 2024-01-18 SURGERY — ECHOCARDIOGRAM, TRANSESOPHAGEAL, INTRAOPERATIVE
Anesthesia: Moderate Sedation

## 2024-01-18 MED ORDER — DILTIAZEM HCL 30 MG PO TABS: 60.0000 mg | ORAL_TABLET | Freq: Four times a day (QID) | ORAL | Status: DC

## 2024-01-18 MED ORDER — PHENYLEPHRINE 80 MCG/ML (10ML) SYRINGE FOR IV PUSH (FOR BLOOD PRESSURE SUPPORT)
PREFILLED_SYRINGE | INTRAVENOUS | Status: AC
  Filled 2024-01-18: qty 20

## 2024-01-18 MED ORDER — SODIUM CHLORIDE 0.9 % IV BOLUS
250.0000 mL | Freq: Once | INTRAVENOUS | Status: AC
  Administered 2024-01-18: 250 mL via INTRAVENOUS

## 2024-01-18 MED ORDER — APIXABAN 5 MG PO TABS
5.0000 mg | ORAL_TABLET | Freq: Two times a day (BID) | ORAL | 2 refills | Status: DC
  Filled 2024-01-18: qty 60, 30d supply, fill #0

## 2024-01-18 MED ORDER — METOPROLOL TARTRATE 25 MG PO TABS
25.0000 mg | ORAL_TABLET | Freq: Two times a day (BID) | ORAL | 0 refills | Status: DC
  Filled 2024-01-18: qty 60, 30d supply, fill #0

## 2024-01-18 MED ORDER — BUTAMBEN-TETRACAINE-BENZOCAINE 2-2-14 % EX AERO
INHALATION_SPRAY | CUTANEOUS | Status: DC
  Filled 2024-01-18: qty 5

## 2024-01-18 MED ORDER — PHENYLEPHRINE 80 MCG/ML (10ML) SYRINGE FOR IV PUSH (FOR BLOOD PRESSURE SUPPORT)
PREFILLED_SYRINGE | INTRAVENOUS | Status: DC | PRN
  Administered 2024-01-18: 160 ug via INTRAVENOUS
  Administered 2024-01-18 (×2): 80 ug via INTRAVENOUS

## 2024-01-18 MED ORDER — FLECAINIDE ACETATE 50 MG PO TABS
50.0000 mg | ORAL_TABLET | Freq: Two times a day (BID) | ORAL | 0 refills | Status: DC
  Filled 2024-01-18: qty 60, 30d supply, fill #0

## 2024-01-18 MED ORDER — LIDOCAINE VISCOUS HCL 2 % MT SOLN
OROMUCOSAL | Status: DC
  Filled 2024-01-18: qty 15

## 2024-01-18 MED ORDER — PROPOFOL 10 MG/ML IV BOLUS
INTRAVENOUS | Status: AC
  Filled 2024-01-18: qty 40

## 2024-01-18 MED ORDER — DILTIAZEM HCL ER COATED BEADS 120 MG PO CP24: 240.0000 mg | ORAL_CAPSULE | Freq: Every day | ORAL | Status: DC

## 2024-01-18 MED ORDER — DILTIAZEM HCL ER COATED BEADS 240 MG PO CP24
240.0000 mg | ORAL_CAPSULE | Freq: Every day | ORAL | 0 refills | Status: DC
  Filled 2024-01-18: qty 30, 30d supply, fill #0

## 2024-01-18 MED ORDER — PROPOFOL 10 MG/ML IV BOLUS
INTRAVENOUS | Status: DC | PRN
  Administered 2024-01-18 (×3): 20 mg via INTRAVENOUS
  Administered 2024-01-18: 100 mg via INTRAVENOUS
  Administered 2024-01-18: 20 mg via INTRAVENOUS

## 2024-01-18 MED ORDER — LIDOCAINE VISCOUS HCL 2 % MT SOLN
15.0000 mL | OROMUCOSAL | Status: AC
  Administered 2024-01-18: 15 mL via OROMUCOSAL
  Filled 2024-01-18: qty 15

## 2024-01-18 NOTE — Progress Notes (Signed)
 Cardizem  drip was titrated to 7.5mg /hr. the Patient's HR remained in the 90's. BP were 85/67(MAP 75), 87/70(MAP 77), 78/59(MAP 67). Due to the hypotension, the Cardizem  was discontinued and providerETTER Madison Peaches, MD) was notified. A 250 ml bolus was administered per provider's order. Will continue to monitor patient closely

## 2024-01-18 NOTE — Anesthesia Preprocedure Evaluation (Signed)
 Anesthesia Evaluation  Patient identified by MRN, date of birth, ID band Patient awake    Reviewed: Allergy & Precautions, NPO status , Patient's Chart, lab work & pertinent test results  History of Anesthesia Complications Negative for: history of anesthetic complications  Airway Mallampati: III  TM Distance: <3 FB Neck ROM: full    Dental  (+) Chipped   Pulmonary sleep apnea    Pulmonary exam normal        Cardiovascular hypertension, + dysrhythmias Atrial Fibrillation  Rhythm:irregular Rate:Tachycardia     Neuro/Psych negative neurological ROS  negative psych ROS   GI/Hepatic Neg liver ROS,GERD  Controlled,,  Endo/Other  negative endocrine ROS    Renal/GU negative Renal ROS  negative genitourinary   Musculoskeletal   Abdominal   Peds  Hematology negative hematology ROS (+)   Anesthesia Other Findings Past Medical History: No date: A-fib (HCC) No date: Arthritis     Comment:  hands No date: Chronic systolic dysfunction of left ventricle No date: Obstructive sleep apnea     Comment:  mild No date: Persistent atrial fibrillation (HCC) No date: Wears hearing aid in both ears     Comment:  12/11/22.  Currently non-functional  Past Surgical History: 05/29/2017: ATRIAL FIBRILLATION ABLATION; N/A     Comment:  Procedure: ATRIAL FIBRILLATION ABLATION;  Surgeon:               Kelsie Agent, MD;  Location: [facility] INVASIVE CV LAB;                Service: Cardiovascular;  Laterality: N/A; No date: CARDIAC CATHETERIZATION No date: CARDIOVERSION 12/24/2016: CARDIOVERSION; N/A     Comment:  Procedure: CARDIOVERSION;  Surgeon: Delford Maude BROCKS, MD;              Location: [facility] ENDOSCOPY;  Service: Cardiovascular;                Laterality: N/A; 03/20/2017: CARDIOVERSION; N/A     Comment:  Procedure: CARDIOVERSION;  Surgeon: Okey Vina GAILS, MD;                Location: [facility] ENDOSCOPY;  Service: Cardiovascular;                 Laterality: N/A; 12/18/2022: CATARACT EXTRACTION W/PHACO; Right     Comment:  Procedure: CATARACT EXTRACTION PHACO AND INTRAOCULAR               LENS PLACEMENT (IOC) RIGHT 5.39 00:32.8;  Surgeon:               Enola Feliciano Hugger, MD;  Location: [facility] SURGERY               CN[facility];  Service: Ophthalmology;  Laterality: Right; 12/10/2018: COLONOSCOPY WITH PROPOFOL ; N/A     Comment:  Procedure: COLONOSCOPY WITH PROPOFOL ;  Surgeon: Therisa Bi, MD;  Location: [facility] ENDOSCOPY;  Service:               Gastroenterology;  Laterality: N/A; 12/28/2014: ELECTROPHYSIOLOGIC STUDY; N/A     Comment:  Procedure: CARDIOVERSION;  Surgeon: Wolm JINNY Rhyme,               MD;  Location: [facility] ORS;  Service: Cardiovascular;                Laterality: N/A; 09/07/2015: ELECTROPHYSIOLOGIC STUDY; N/A     Comment:  Procedure: CARDIOVERSION;  Surgeon: Wolm JINNY Rhyme,  MD;  Location: [facility] ORS;  Service: Cardiovascular;                Laterality: N/A; 03/18/2016: ELECTROPHYSIOLOGIC STUDY; N/A     Comment:  Procedure: Atrial Fibrillation Ablation;  Surgeon: Lynwood Rakers, MD;  Location: [facility] INVAS[facility] CV LAB;  Service:               Cardiovascular;  Laterality: N/A; 08/06/2017: RIGHT/LEFT HEART CATH AND CORONARY ANGIOGRAPHY; N/A     Comment:  Procedure: RIGHT/LEFT HEART CATH AND CORONARY               ANGIOGRAPHY;  Surgeon: Jordan, Peter M, MD;  Location: [facility]              INVASIVE CV LAB;  Service: Cardiovascular;  Laterality:               N/A; 09/07/2015: TEE WITHOUT CARDIOVERSION; N/A     Comment:  Procedure: TRANSESOPHAGEAL ECHOCARDIOGRAM (TEE);                Surgeon: Wolm JINNY Rhyme, MD;  Location: [facility] ORS;                Service: Cardiovascular;  Laterality: N/A; 03/17/2016: TEE WITHOUT CARDIOVERSION; N/A     Comment:  Procedure: TRANSESOPHAGEAL ECHOCARDIOGRAM (TEE);                Surgeon: Redell GORMAN Shallow, MD;  Location: [facility] ENDOSCOPY;                Service:  Cardiovascular;  Laterality: N/A; 12/24/2016: TEE WITHOUT CARDIOVERSION; N/A     Comment:  Procedure: TRANSESOPHAGEAL ECHOCARDIOGRAM (TEE);                Surgeon: Delford Maude BROCKS, MD;  Location: [facility] ENDOSCOPY;                Service: Cardiovascular;  Laterality: N/A; 05/29/2017: TEE WITHOUT CARDIOVERSION; N/A     Comment:  Procedure: TRANSESOPHAGEAL ECHOCARDIOGRAM (TEE);                Surgeon: Shallow Redell GORMAN, MD;  Location: [facility] ENDOSCOPY;               Service: Cardiovascular;  Laterality: N/A;  BMI    Body Mass Index: 29.42 kg/m      Reproductive/Obstetrics negative OB ROS                              Anesthesia Physical Anesthesia Plan  ASA: 4  Anesthesia Plan: General   Post-op Pain Management:    Induction: Intravenous  PONV Risk Score and Plan: Propofol  infusion and TIVA  Airway Management Planned: Natural Airway and Nasal Cannula  Additional Equipment:   Intra-op Plan:   Post-operative Plan:   Informed Consent: I have reviewed the patients History and Physical, chart, labs and discussed the procedure including the risks, benefits and alternatives for the proposed anesthesia with the patient or authorized representative who has indicated his/her understanding and acceptance.     Dental Advisory Given  Plan Discussed with: Anesthesiologist, CRNA and Surgeon  Anesthesia Plan Comments: (Patient consented for risks of anesthesia including but not limited to:  - adverse reactions to medications - risk of airway placement if required - damage to eyes, teeth, lips or other oral mucosa - nerve damage due to positioning  -  sore throat or hoarseness - Damage to heart, brain, nerves, lungs, other parts of body or loss of life  Patient voiced understanding and assent.)        Anesthesia Quick Evaluation

## 2024-01-18 NOTE — Procedures (Addendum)
 Transesophageal Echocardiogram :  Indication: atrial fibrillation Requesting/ordering  physician:   Procedure: 10 ml of viscous lidocaine  were given orally to provide local anesthesia to the oropharynx. The patient was positioned supine on the left side, bite block provided. The patient was sedated per anesthesia team.  Using digital technique an omniplane probe was advanced into the esophagus without incident.   See report in EPIC  for complete details: In brief, imaging revealed normal LV function with no RWMAs and no mural apical thrombus.  .  Estimated ejection fraction was 50-55%.  Right sided cardiac chambers were normal with no evidence of pulmonary hypertension.  Imaging of the septum showed no ASD or VSD Bubble study was negative for shunt 2D and color flow confirmed no PFO  The LA was well visualized in orthogonal views.  There was no spontaneous contrast and no thrombus in the LA and LA appendage   The descending thoracic aorta had no  mural aortic debris with no evidence of aneurysmal dilation or disection  Cardioversion procedure note For atrial fibrillation.  Procedure Details:  Consent: Risks of procedure as well as the alternatives and risks of each were explained to the (patient/caregiver).  Consent for procedure obtained.  Patient placed on cardiac monitor, pulse oximetry, supplemental oxygen as necessary.   Sedation given: propofol  IV per anesthesia Pacer pads placed anterior and posterior chest.  Cardioverted 1 time(s).   Cardioverted at  200J. Synchronized biphasic Converted to NSR  Evaluation: Findings: Post procedure EKG shows: NSR Complications: None Patient did tolerate procedure well.  Recommendations Cont flecainide , eliquis . -possible discharge later today. -f/u with ep as outpatient   Redell Agbor-Etang 01/18/2024 12:56 PM

## 2024-01-18 NOTE — Plan of Care (Signed)

## 2024-01-18 NOTE — Discharge Summary (Addendum)
 DISCHARGE SUMMARY    Francisco Reed:[id number] DOB: 1960-03-28 DOA: 01/15/2024  PCP: Auston Reyes JONETTA, MD  Admit date: 01/15/2024 Discharge date: 01/18/2024   Recommendations for Outpatient Follow-up:  Follow up with PCP in 1-2  weeks to monitor blood pressure and pulse.  Consider repeat sleep study Follow-up with cardiology as scheduled  Hospital Course: Francisco Reed 63 year old male with chronic atrial fibrillation, chronic systolic dysfunction, OSA, osteoarthritis, who presented to the ED with acute onset midsternal chest pain and pressure and palpitations.  He was recently taken off his Eliquis  by PCP after sustaining a calf injury with large hematoma.  He has previously had 2 ablations for paroxysmal A-fib as well as 15 cardioversions.  On arrival to the ED heart rate was elevated 160s.  Cardiology was consulted.  Patient was started on scheduled metoprolol , flecainide , Cardizem .  He was also initiated again on Eliquis .  Rate proved difficult to control.  Patient underwent successful TEE/DCCV on 12/1.  At discharge he will be going home with Cardizem , flecainide , and Eliquis  per cardiology. He has excellent outpatient follow-up with his primary care physician and cardiology already established.    A-fib RVR - Status post successful TEE/DCCV 12/1 - Continue with Cardizem  and flecainide  at DC - Advised the patient to continue monitoring his blood pressure and pulse at home.  He may need to decrease these medications if he is becoming hypotensive or bradycardic. - Was resumed on Eliquis  this admission.  He would like to continue taking it at DC. - CHA2DS2-VASc:1  - Echocardiogram this admission: EF 50 to 55%, left ventricle demonstrates global hypokinesis, mild left ventricular hypertrophy, diastolic parameters indeterminate. - Follows in A-fib clinic   Nonobstructive CAD - Troponin unremarkable during this admission - Cath 2019 with nonobstructive CAD - Echo with left  ventricular hypokinesis as above. Not noted in TEE. Follow-up with cardiology closely   Prediabetes - Repeat hemoglobin A1c this admission 6.1%, still consistent with prediabetes - Outpatient follow-up with PCP  Body mass index is 29.41 kg/m. - Outpatient follow up for lifestyle modification and risk factor management   OSA - Patient reports he does not use a CPAP at night - Given his persistent A-fib recommend repeat sleep study   Osteoarthritis -stable. PRN tylenol     Discharge Instructions  Discharge Instructions     Amb referral to AFIB Clinic   Complete by: As directed    Call MD for:  difficulty breathing, headache or visual disturbances   Complete by: As directed    Call MD for:  persistant dizziness or light-headedness   Complete by: As directed    Call MD for:  persistant nausea and vomiting   Complete by: As directed    Call MD for:  severe uncontrolled pain   Complete by: As directed    Call MD for:  temperature >100.4   Complete by: As directed    Diet general   Complete by: As directed    Discharge instructions   Complete by: As directed    Please follow up with cardiology as scheduled. See your PCP to review that changes made during this hospitalization.   Increase activity slowly   Complete by: As directed       Allergies as of 01/18/2024   No Known Allergies      Medication List     STOP taking these medications    oxyCODONE -acetaminophen  5-325 MG tablet Commonly known as: Percocet       TAKE these medications  acetaminophen  500 MG tablet Commonly known as: TYLENOL  Take 1,000 mg by mouth as needed for mild pain (pain score 1-3) or moderate pain (pain score 4-6).   ascorbic acid 1000 MG tablet Commonly known as: VITAMIN C Take 1,000 mg by mouth 2 (two) times daily.   Biotin 10 MG Caps Take 1 capsule by mouth every evening.   diltiazem  240 MG 24 hr capsule Commonly known as: CARDIZEM  CD Take 1 capsule (240 mg total) by mouth  daily. Start taking on: January 19, 2024   Eliquis  5 MG Tabs tablet Generic drug: apixaban  Take 1 tablet (5 mg total) by mouth 2 (two) times daily.   finasteride 1 MG tablet Commonly known as: PROPECIA Take 1 tablet by mouth daily.   flecainide  50 MG tablet Commonly known as: TAMBOCOR  Take 1 tablet (50 mg total) by mouth 2 (two) times daily.   tadalafil  5 MG tablet Commonly known as: Cialis  Take 1 tablet (5 mg total) by mouth daily as needed for erectile dysfunction.   VITAMIN D-3 PO Take 1 tablet by mouth every morning.        No Known Allergies  Consultations: Treatment Team:  Darron Deatrice LABOR, MD   Procedures/Studies: ECHOCARDIOGRAM COMPLETE Result Date: 01/17/2024    ECHOCARDIOGRAM REPORT   Patient Name:   Francisco Reed Date of Exam: 01/17/2024 Medical Rec #:  [id number]        Height:       70.0 in Accession #:    [id number]       Weight:       205.0 lb Date of Birth:  January 21, 1961        BSA:          2.109 m Patient Age:    63 years         BP:           99/73 mmHg Patient Gender: M                HR:           161 bpm. Exam Location:  [facility] Procedure: 2D Echo, Cardiac Doppler and Color Doppler (Both Spectral and Color            Flow Doppler were utilized during procedure). Indications:     I50.20* Unspecified systolic (congestive) heart failure  History:         Patient has prior history of Echocardiogram examinations, most                  recent 03/11/2021. Cardiomyopathy, Abnormal ECG,                  Arrythmias:Tachycardia and Atrial Fibrillation,                  Signs/Symptoms:Edema and Chest Pain; Risk Factors:Dyslipidemia                  and Non-Smoker.  Sonographer:     Doyal Point MHA, BS, RDCS Referring Phys:  [id number] LORANE POLAND Diagnosing Phys: Redell Cave MD IMPRESSIONS  1. Left ventricular ejection fraction, by estimation, is 50 to 55%. The left ventricle has low normal function. The left ventricle demonstrates global hypokinesis. There is  mild left ventricular hypertrophy. Left ventricular diastolic parameters are indeterminate.  2. Right ventricular systolic function is normal. The right ventricular size is normal.  3. The mitral valve is normal in structure. Mild mitral valve regurgitation.  4. The aortic valve is tricuspid. Aortic valve  regurgitation is mild. FINDINGS  Left Ventricle: Left ventricular ejection fraction, by estimation, is 50 to 55%. The left ventricle has low normal function. The left ventricle demonstrates global hypokinesis. The left ventricular internal cavity size was normal in size. There is mild left ventricular hypertrophy. Left ventricular diastolic parameters are indeterminate. Right Ventricle: The right ventricular size is normal. No increase in right ventricular wall thickness. Right ventricular systolic function is normal. Left Atrium: Left atrial size was normal in size. Right Atrium: Right atrial size was normal in size. Pericardium: There is no evidence of pericardial effusion. Mitral Valve: The mitral valve is normal in structure. Mild mitral valve regurgitation. Tricuspid Valve: The tricuspid valve is normal in structure. Tricuspid valve regurgitation is mild. Aortic Valve: The aortic valve is tricuspid. Aortic valve regurgitation is mild. Aortic valve mean gradient measures 6.0 mmHg. Aortic valve peak gradient measures 11.3 mmHg. Aortic valve area, by VTI measures 1.43 cm. Pulmonic Valve: The pulmonic valve was not well visualized. Pulmonic valve regurgitation is not visualized. Aorta: The aortic root is normal in size and structure. Venous: The inferior vena cava was not well visualized. IAS/Shunts: No atrial level shunt detected by color flow Doppler.  LEFT VENTRICLE PLAX 2D LVIDd:         4.10 cm LVIDs:         3.60 cm LV PW:         1.30 cm LV IVS:        1.40 cm LVOT diam:     2.10 cm LV SV:         33 LV SV Index:   16 LVOT Area:     3.46 cm  RIGHT VENTRICLE RV Basal diam:  3.90 cm RV Mid diam:    3.10 cm  LEFT ATRIUM           Index        RIGHT ATRIUM           Index LA diam:      3.80 cm 1.80 cm/m   RA Area:     16.80 cm LA Vol (A2C): 40.6 ml 19.25 ml/m  RA Volume:   40.30 ml  19.11 ml/m LA Vol (A4C): 50.0 ml 23.70 ml/m  AORTIC VALVE AV Area (Vmax):    1.58 cm AV Area (Vmean):   1.50 cm AV Area (VTI):     1.43 cm AV Vmax:           168.00 cm/s AV Vmean:          117.000 cm/s AV VTI:            0.232 m AV Peak Grad:      11.3 mmHg AV Mean Grad:      6.0 mmHg LVOT Vmax:         76.50 cm/s LVOT Vmean:        50.800 cm/s LVOT VTI:          0.096 m LVOT/AV VTI ratio: 0.41  AORTA Ao Root diam: 3.80 cm Ao Asc diam:  3.40 cm  SHUNTS Systemic VTI:  0.10 m Systemic Diam: 2.10 cm Redell Cave MD Electronically signed by Redell Cave MD Signature Date/Time: 01/17/2024/11:23:46 AM    Final    DG Chest Port 1 View Result Date: 01/15/2024 CLINICAL DATA:  Chest pain beginning an hour prior to admission. EXAM: PORTABLE CHEST 1 VIEW COMPARISON:  12/22/2022 and older exams. FINDINGS: Cardiac silhouette is normal in size and configuration. No mediastinal or hilar masses.  Clear lungs.  No pleural effusion or pneumothorax. Skeletal structures are grossly intact. IMPRESSION: No active disease. Electronically Signed   By: Alm Parkins M.D.   On: 01/15/2024 18:29      Discharge Exam: Vitals:   01/18/24 1330 01/18/24 1409  BP: 105/72 116/77  Pulse: 65 62  Resp: (!) 0 18  Temp: (!) 97.5 F (36.4 C) (!) 97.5 F (36.4 C)  SpO2: 95% 98%   Vitals:   01/18/24 1300 01/18/24 1315 01/18/24 1330 01/18/24 1409  BP: 100/71 98/74 105/72 116/77  Pulse: 60 61 65 62  Resp: (!) 0 12 (!) 0 18  Temp:   (!) 97.5 F (36.4 C) (!) 97.5 F (36.4 C)  TempSrc:   Temporal Oral  SpO2: 95% 98% 95% 98%  Weight:      Height:        Constitutional:  Normal appearance. Non toxic-appearing.  HENT: Head Normocephalic and atraumatic.  Mucous membranes are moist.  Eyes:  Extraocular intact. Conjunctivae normal.   Cardiovascular: Rate and Rhythm: Normal rate and regular rhythm.  Pulmonary: Non labored, symmetric rise of chest wall.  Skin: warm and dry. not jaundiced.  Neurological: No focal deficit present. alert. Oriented.  Psychiatric: Mood and Affect congruent.    The results of significant diagnostics from this hospitalization (including imaging, microbiology, ancillary and laboratory) are listed below for reference.     Microbiology: No results found for this or any previous visit (from the past 240 hours).   Labs: BNP (last 3 results) No results for input(s): BNP in the last 8760 hours. Basic Metabolic Panel: Recent Labs  Lab 01/15/24 1808 01/15/24 1954 01/16/24 0447 01/17/24 1610 01/18/24 0338  NA 137  --  138  --  139  K 3.8  --  4.0 4.2 4.0  CL 103  --  104  --  104  CO2 22  --  26  --  24  GLUCOSE 121*  --  109*  --  93  BUN 19  --  18  --  18  CREATININE 0.87  --  0.78  --  0.90  CALCIUM 9.0  --  9.2  --  8.9  MG  --  2.0  --   --  2.2  PHOS  --   --   --   --  3.7   Liver Function Tests: Recent Labs  Lab 01/18/24 0338  AST 30  ALT 37  ALKPHOS 75  BILITOT 0.3  PROT 6.9  ALBUMIN 3.9   No results for input(s): LIPASE, AMYLASE in the last 168 hours. No results for input(s): AMMONIA in the last 168 hours. CBC: Recent Labs  Lab 01/15/24 1808 01/16/24 0447 01/18/24 0338  WBC 7.2 7.1 7.5  NEUTROABS  --   --  3.8  HGB 13.5 13.7 13.9  HCT 41.1 40.8 42.3  MCV 91.1 89.9 90.8  PLT 228 234 249   Cardiac Enzymes: No results for input(s): CKTOTAL, CKMB, CKMBINDEX, TROPONINI in the last 168 hours. BNP: Invalid input(s): POCBNP CBG: No results for input(s): GLUCAP in the last 168 hours. D-Dimer No results for input(s): DDIMER in the last 72 hours. Hgb A1c Recent Labs    01/16/24 1506  HGBA1C 6.1*   Lipid Profile No results for input(s): CHOL, HDL, LDLCALC, TRIG, CHOLHDL, LDLDIRECT in the last 72 hours. Thyroid function  studies Recent Labs    01/18/24 0338  TSH 1.500   Anemia work up No results for input(s): VITAMINB12, FOLATE, FERRITIN, TIBC,  IRON, RETICCTPCT in the last 72 hours. Urinalysis    Component Value Date/Time   COLORURINE Yellow 08/17/2012 1356   APPEARANCEUR Cloudy 08/17/2012 1356   LABSPEC 1.025 08/17/2012 1356   PHURINE 5.0 08/17/2012 1356   GLUCOSEU Negative 08/17/2012 1356   HGBUR Negative 08/17/2012 1356   BILIRUBINUR Negative 08/17/2012 1356   KETONESUR Negative 08/17/2012 1356   PROTEINUR Negative 08/17/2012 1356   NITRITE Negative 08/17/2012 1356   LEUKOCYTESUR Negative 08/17/2012 1356   Sepsis Labs Recent Labs  Lab 01/15/24 1808 01/16/24 0447 01/18/24 0338  WBC 7.2 7.1 7.5   Microbiology No results found for this or any previous visit (from the past 240 hours).   Time coordinating discharge: 32 min   SIGNED: Sharifa Bucholz, DO [facility] 01/18/2024, 3:37 PM Pager   If 7PM-7AM, please contact night-coverage

## 2024-01-18 NOTE — Progress Notes (Signed)
*  PRELIMINARY RESULTS* Echocardiogram Echocardiogram Transesophageal has been performed.  Francisco Reed 01/18/2024, 12:53 PM

## 2024-01-18 NOTE — Progress Notes (Deleted)
*  PRELIMINARY RESULTS* Echocardiogram 2D Echocardiogram has been performed.  Floydene Harder 01/18/2024, 12:53 PM

## 2024-01-18 NOTE — Progress Notes (Addendum)
 Rounding Note   Patient Name: Francisco Reed Date of Encounter: 01/18/2024  [facility] Cardiologist: None   Subjective Remains in Afib with ventricular rates trending into the low 100s to 130s bpm off Cardizem  gtt due to soft BP in the 90s mmHg systolic. With this, notes some chest tightness. NPO for TEE/DCCV later today.    Scheduled Meds:  apixaban   5 mg Oral BID   ascorbic acid   2,000 mg Oral Daily   cholecalciferol   5,000 Units Oral q morning   diltiazem   60 mg Oral QID   flecainide   50 mg Oral BID   metoprolol  tartrate  25 mg Oral BID   potassium chloride   40 mEq Oral Once   Continuous Infusions:  sodium chloride  20 mL/hr at 01/18/24 0041   diltiazem  (CARDIZEM ) infusion 7.5 mg/hr (01/17/24 2249)   PRN Meds: acetaminophen , ALPRAZolam , magnesium  hydroxide, ondansetron  (ZOFRAN ) IV, oxyCODONE -acetaminophen , traZODone    Vital Signs  Vitals:   01/18/24 0430 01/18/24 0500 01/18/24 0530 01/18/24 0701  BP: (!) 114/92 (!) 110/90  126/84  Pulse:    98  Resp: 17 16 13 18   Temp:    (!) 97.3 F (36.3 C)  TempSrc:    Oral  SpO2:    97%  Weight:      Height:        Intake/Output Summary (Last 24 hours) at 01/18/2024 0814 Last data filed at 01/17/2024 1912 Gross per 24 hour  Intake 300 ml  Output 550 ml  Net -250 ml      01/15/2024    5:51 PM 08/14/2023    1:28 PM 08/14/2023   11:53 AM  Last 3 Weights  Weight (lbs) 205 lb 205 lb 207 lb 3.7 oz  Weight (kg) 92.987 kg 92.987 kg 94 kg      Telemetry Atrial fibrillation with ventricular rates in the 90s to low 100s bpm with brief paroxysms into the 130s bpm, PVCs/NSVT vs aberrancy - Personally Reviewed  ECG  No new tracings - Personally Reviewed  Physical Exam  GEN: No acute distress.   Neck: No JVD Cardiac: Mildly tachycardic, irregular irregular. Respiratory: Clear to auscultation bilaterally. GI: Soft, nontender, non-distended  MS: No edema; No deformity. Neuro:  Nonfocal  Psych: Normal affect    Labs High Sensitivity Troponin:  No results for input(s): TROPONINIHS in the last 720 hours.   Chemistry Recent Labs  Lab 01/15/24 1808 01/15/24 1954 01/16/24 0447 01/17/24 1610 01/18/24 0338  NA 137  --  138  --  139  K 3.8  --  4.0 4.2 4.0  CL 103  --  104  --  104  CO2 22  --  26  --  24  GLUCOSE 121*  --  109*  --  93  BUN 19  --  18  --  18  CREATININE 0.87  --  0.78  --  0.90  CALCIUM 9.0  --  9.2  --  8.9  MG  --  2.0  --   --  2.2  PROT  --   --   --   --  6.9  ALBUMIN  --   --   --   --  3.9  AST  --   --   --   --  30  ALT  --   --   --   --  37  ALKPHOS  --   --   --   --  75  BILITOT  --   --   --   --  0.3  GFRNONAA >60  --  >60  --  >60  ANIONGAP 12  --  9  --  11    Lipids No results for input(s): CHOL, TRIG, HDL, LABVLDL, LDLCALC, CHOLHDL in the last 168 hours.  Hematology Recent Labs  Lab 01/15/24 1808 01/16/24 0447 01/18/24 0338  WBC 7.2 7.1 7.5  RBC 4.51 4.54 4.66  HGB 13.5 13.7 13.9  HCT 41.1 40.8 42.3  MCV 91.1 89.9 90.8  MCH 29.9 30.2 29.8  MCHC 32.8 33.6 32.9  RDW 13.0 13.0 13.2  PLT 228 234 249   Thyroid No results for input(s): TSH, FREET4 in the last 168 hours.  BNP Recent Labs  Lab 01/15/24 1808  PROBNP <50.0    DDimer No results for input(s): DDIMER in the last 168 hours.   Radiology    Cardiac Studies 2D echo 01/17/2024: 1. Left ventricular ejection fraction, by estimation, is 50 to 55%. The  left ventricle has low normal function. The left ventricle demonstrates  global hypokinesis. There is mild left ventricular hypertrophy. Left  ventricular diastolic parameters are  indeterminate.   2. Right ventricular systolic function is normal. The right ventricular  size is normal.   3. The mitral valve is normal in structure. Mild mitral valve  regurgitation.   4. The aortic valve is tricuspid. Aortic valve regurgitation is mild.   Patient Profile   63 y.o. male with history of persistent atrial  fibrillation s/p ablation x 2 (last in 05/2017), presenting with chest pain and palpitations, who we are seeing for A-fib RVR.   Assessment & Plan  A-fib RVR - Remains in Afib  - Cardizem  gtt held with relative hypotension - Remain on Lopressor  25 mg bid, and flecainide  50 mg bid - NPO for TEE/DCCV later today - Patient maintained sinus rhythm while on flecainide  in the past - Flecainide  and Eliquis  were discontinued 5 months ago - Potassium and magnesium at goal - Check TSH - Follow up with Afib clinic/EP in the outpatient setting     Informed Consent   Shared Decision Making/Informed Consent{  The risks [stroke, cardiac arrhythmias rarely resulting in the need for a temporary or permanent pacemaker, skin irritation or burns, esophageal damage, perforation (1:10,000 risk), bleeding, pharyngeal hematoma as well as other potential complications associated with conscious sedation including aspiration, arrhythmia, respiratory failure and death], benefits (treatment guidance, restoration of normal sinus rhythm, diagnostic support) and alternatives of a transesophageal echocardiogram guided cardioversion were discussed in detail with Francisco Reed and he is willing to proceed.      Signed, Francisco Bring, PA-C  01/18/2024, 8:14 AM

## 2024-01-18 NOTE — TOC CM/SW Note (Signed)
 Transition of Care [facility]) - Inpatient Brief Assessment   Patient Details  Name: Francisco Reed MRN: [id number] Date of Birth: Mar 21, 1960  [facility] CM/SW Contact:    Shasta DELENA Daring, RN Phone Number: 01/18/2024, 10:46 AM   Clinical Narrative: Chart reviewed. No TOC needs identified. Patient has orders to discharge home. RNCM signing off.   Transition of Care Asessment: Insurance and Status: Insurance coverage has been reviewed Patient has primary care physician: Yes     Prior/Current Home Services: No current home services Social Drivers of Health Review: SDOH reviewed no interventions necessary Readmission risk has been reviewed: Yes Transition of care needs: no transition of care needs at this time

## 2024-01-18 NOTE — Telephone Encounter (Signed)
 Patient Product/process Development Scientist completed.    The patient is insured through [facility]. Patient has [employer], may use a copay card, and/or apply for patient assistance if available.    Ran test claim for Eliquis  5 mg and the current 30 day co-pay is $294.48.   This test claim was processed through [facility]- copay amounts may vary at other pharmacies due to pharmacy/plan contracts, or as the patient moves through the different stages of their insurance plan.     Reyes Sharps, CPHT Pharmacy Technician Patient Advocate Specialist Lead [facility] Patient Advocate Team Direct Number: [phone]  Fax: [phone]

## 2024-01-18 NOTE — Anesthesia Postprocedure Evaluation (Signed)
 Anesthesia Post Note  Patient: Francisco Reed  Procedure(s) Performed: ECHOCARDIOGRAM, TRANSESOPHAGEAL, INTRAOPERATIVE CARDIOVERSION ECHO TEE  Patient location during evaluation: Specials Recovery Anesthesia Type: General Level of consciousness: awake and alert Pain management: pain level controlled Vital Signs Assessment: post-procedure vital signs reviewed and stable Respiratory status: spontaneous breathing, nonlabored ventilation, respiratory function stable and patient connected to nasal cannula oxygen Cardiovascular status: blood pressure returned to baseline and stable Postop Assessment: no apparent nausea or vomiting Anesthetic complications: no   No notable events documented.   Last Vitals:  Vitals:   01/18/24 1315 01/18/24 1330  BP: 98/74 105/72  Pulse: 61 65  Resp: 12 (!) 0  Temp:  (!) 36.4 C  SpO2: 98% 95%    Last Pain:  Vitals:   01/18/24 1330  TempSrc: Temporal  PainSc:                  Fairy POUR [place]

## 2024-01-18 NOTE — Transfer of Care (Signed)
 Immediate Anesthesia Transfer of Care Note  Patient: CRAWFORD TAMURA  Procedure(s) Performed: ECHOCARDIOGRAM, TRANSESOPHAGEAL, INTRAOPERATIVE CARDIOVERSION ECHO TEE  Patient Location: Cath Lab  Anesthesia Type:General  Level of Consciousness: awake and drowsy  Airway & Oxygen Therapy: Patient Spontanous Breathing and Patient connected to nasal cannula oxygen  Post-op Assessment: Report given to RN and Post -op Vital signs reviewed and stable  Post vital signs: Reviewed and stable  Last Vitals:  Vitals Value Taken Time  BP 112/71 01/18/24 12:55  Temp 35.9 1255  Pulse 58 01/18/24 12:56  Resp 12 01/18/24 12:56  SpO2 98 % 01/18/24 12:56  Vitals shown include unfiled device data.  Last Pain:  Vitals:   01/18/24 1151  TempSrc: Temporal  PainSc: 0-No pain      Patients Stated Pain Goal: 0 (01/17/24 2259)  Complications: No notable events documented.

## 2024-01-19 ENCOUNTER — Encounter: Payer: Self-pay | Admitting: Cardiology

## 2024-01-27 ENCOUNTER — Ambulatory Visit (HOSPITAL_COMMUNITY): Admission: RE | Admit: 2024-01-27 | Discharge: 2024-01-27 | Attending: Internal Medicine | Admitting: Internal Medicine

## 2024-01-27 ENCOUNTER — Encounter (HOSPITAL_COMMUNITY): Payer: Self-pay | Admitting: Internal Medicine

## 2024-01-27 VITALS — BP 124/84 | HR 65 | Ht 70.0 in | Wt 216.4 lb

## 2024-01-27 DIAGNOSIS — I4819 Other persistent atrial fibrillation: Secondary | ICD-10-CM

## 2024-01-27 DIAGNOSIS — Z79899 Other long term (current) drug therapy: Secondary | ICD-10-CM | POA: Diagnosis not present

## 2024-01-27 DIAGNOSIS — Z5181 Encounter for therapeutic drug level monitoring: Secondary | ICD-10-CM

## 2024-01-27 NOTE — Progress Notes (Signed)
 Primary Care Physician: Auston Reyes BIRCH, MD Referring Physician:Dr. Kelsie   Francisco Reed is a 63 y.o. male with a h/o persistent afib s/p 2 ablations, with return of afib. He was placed on flecainide  50 mg bid, returned to prior BB dose and restarted eliquis . He self converted on flecainide  after about 4 days on drug. He is back in the office and continues in SR. He feels well. He wants to continue on all drugs that he is currently on. Recent cardiac cath showed nonobstructive disease with 35% stenosis of LAD and with normal EF by cardiac cath.    On follow up 07/15/22, he is currently in NSR. He has not had any episodes of Afib recently. He has a history of 2 ablations (last in 05/2017) and multiple DCCV's. He is currently on flecainide  and Eliquis  5 mg BID. He currently does not meet guidelines for anticoagulation but wishes to continue Eliquis ; his father had a stroke because he stopped his blood thinner and he wishes to prevent this.   On follow up 12/26/22, he is currently in NSR. Recent ED visit on 12/22/22 for Afib with RVR (HR 170s) and underwent successful DCCV in the ED. He has felt well since then. He is currently taking flecainide  50 mg BID and Eliquis  5 mg BID.   On follow up 07/31/23, patient is here for flecainide  surveillance. He is currently in NSR. He has had no episodes of Afib since November. He takes flecainide  50 mg BID.   Follow-up 01/27/2024.  Patient is currently in NSR.  S/p hospital admission 11/28 - 12/1 for chest pain and palpitations found to be in A-fib with RVR.  Patient underwent successful TEE/DCCV on 12/1 and was discharged on flecainide , Cardizem , and Eliquis .  Patient seen by me previously in June and had decided to stop flecainide  at that time due to overall low A-fib burden.  Since hospital discharge, patient has been feeling well overall.  He is tolerating the flecainide  and has not missed any doses of Eliquis .  Today, he denies symptoms of palpitations,  chest pain, shortness of breath, orthopnea, PND, lower extremity edema, dizziness, presyncope, syncope, or neurologic sequela. The patient is tolerating medications without difficulties and is otherwise without complaint today.     Past Medical History:  Diagnosis Date   A-fib [facility])    Arthritis    hands   Chronic systolic dysfunction of left ventricle    Obstructive sleep apnea    mild   Persistent atrial fibrillation (HCC)    Wears hearing aid in both ears    12/11/22.  Currently non-functional   Past Surgical History:  Procedure Laterality Date   ATRIAL FIBRILLATION ABLATION N/A 05/29/2017   Procedure: ATRIAL FIBRILLATION ABLATION;  Surgeon: Kelsie Agent, MD;  Location: [facility] INVASIVE CV LAB;  Service: Cardiovascular;  Laterality: N/A;   CARDIAC CATHETERIZATION     CARDIOVERSION     CARDIOVERSION N/A 12/24/2016   Procedure: CARDIOVERSION;  Surgeon: Delford Maude BROCKS, MD;  Location: [facility] ENDOSCOPY;  Service: Cardiovascular;  Laterality: N/A;   CARDIOVERSION N/A 03/20/2017   Procedure: CARDIOVERSION;  Surgeon: Okey Vina GAILS, MD;  Location: [facility] ENDOSCOPY;  Service: Cardiovascular;  Laterality: N/A;   CARDIOVERSION N/A 01/18/2024   Procedure: CARDIOVERSION;  Surgeon: Darliss Rogue, MD;  Location: [facility] ORS;  Service: Cardiovascular;  Laterality: N/A;   CATARACT EXTRACTION W/PHACO Right 12/18/2022   Procedure: CATARACT EXTRACTION PHACO AND INTRAOCULAR LENS PLACEMENT (IOC) RIGHT 5.39 00:32.8;  Surgeon: Enola Feliciano Hugger, MD;  Location:  [facility];  Service: Ophthalmology;  Laterality: Right;   COLONOSCOPY WITH PROPOFOL  N/A 12/10/2018   Procedure: COLONOSCOPY WITH PROPOFOL ;  Surgeon: Therisa Bi, MD;  Location: [facility] ENDOSCOPY;  Service: Gastroenterology;  Laterality: N/A;   ELECTROPHYSIOLOGIC STUDY N/A 12/28/2014   Procedure: CARDIOVERSION;  Surgeon: Wolm JINNY Rhyme, MD;  Location: [facility] ORS;  Service: Cardiovascular;  Laterality: N/A;   ELECTROPHYSIOLOGIC STUDY N/A 09/07/2015    Procedure: CARDIOVERSION;  Surgeon: Wolm JINNY Rhyme, MD;  Location: [facility] ORS;  Service: Cardiovascular;  Laterality: N/A;   ELECTROPHYSIOLOGIC STUDY N/A 03/18/2016   Procedure: Atrial Fibrillation Ablation;  Surgeon: Lynwood Rakers, MD;  Location: [facility] INVASIVE CV LAB;  Service: Cardiovascular;  Laterality: N/A;   INTRAOPERATIVE TRANSESOPHAGEAL ECHOCARDIOGRAM N/A 01/18/2024   Procedure: ECHOCARDIOGRAM, TRANSESOPHAGEAL, INTRAOPERATIVE;  Surgeon: Darliss Rogue, MD;  Location: [facility] ORS;  Service: Cardiovascular;  Laterality: N/A;   RIGHT/LEFT HEART CATH AND CORONARY ANGIOGRAPHY N/A 08/06/2017   Procedure: RIGHT/LEFT HEART CATH AND CORONARY ANGIOGRAPHY;  Surgeon: Jordan, Peter M, MD;  Location: [facility] INVASIVE CV LAB;  Service: Cardiovascular;  Laterality: N/A;   TEE WITHOUT CARDIOVERSION N/A 09/07/2015   Procedure: TRANSESOPHAGEAL ECHOCARDIOGRAM (TEE);  Surgeon: Wolm JINNY Rhyme, MD;  Location: [facility] ORS;  Service: Cardiovascular;  Laterality: N/A;   TEE WITHOUT CARDIOVERSION N/A 03/17/2016   Procedure: TRANSESOPHAGEAL ECHOCARDIOGRAM (TEE);  Surgeon: Rogue GORMAN Shallow, MD;  Location: [facility] ENDOSCOPY;  Service: Cardiovascular;  Laterality: N/A;   TEE WITHOUT CARDIOVERSION N/A 12/24/2016   Procedure: TRANSESOPHAGEAL ECHOCARDIOGRAM (TEE);  Surgeon: Delford Maude BROCKS, MD;  Location: [facility] ENDOSCOPY;  Service: Cardiovascular;  Laterality: N/A;   TEE WITHOUT CARDIOVERSION N/A 05/29/2017   Procedure: TRANSESOPHAGEAL ECHOCARDIOGRAM (TEE);  Surgeon: Shallow Rogue GORMAN, MD;  Location: [facility] ENDOSCOPY;  Service: Cardiovascular;  Laterality: N/A;    Current Outpatient Medications  Medication Sig Dispense Refill   acetaminophen  (TYLENOL ) 500 MG tablet Take 1,000 mg by mouth as needed for mild pain (pain score 1-3) or moderate pain (pain score 4-6).     apixaban  (ELIQUIS ) 5 MG TABS tablet Take 1 tablet (5 mg total) by mouth 2 (two) times daily. 60 tablet 2   ascorbic acid  (VITAMIN C ) 1000 MG tablet Take 1,000 mg by mouth 2 (two) times  daily.     Biotin  10 MG CAPS Take 1 capsule by mouth every evening.     Cholecalciferol  (VITAMIN D -3 PO) Take 1 tablet by mouth every morning.     diltiazem  (CARDIZEM  CD) 240 MG 24 hr capsule Take 1 capsule (240 mg total) by mouth daily. 30 capsule 0   finasteride  (PROPECIA ) 1 MG tablet Take 1 tablet by mouth daily.     flecainide  (TAMBOCOR ) 50 MG tablet Take 1 tablet (50 mg total) by mouth 2 (two) times daily. 60 tablet 0   tadalafil  (CIALIS ) 5 MG tablet Take 1 tablet (5 mg total) by mouth daily as needed for erectile dysfunction. 10 tablet 3   No current facility-administered medications for this encounter.    No Known Allergies  ROS- All systems are reviewed and negative except as per the HPI above  Physical Exam: Vitals:   01/27/24 1100  BP: 124/84  Pulse: 65  Weight: 98.2 kg  Height: 5' 10 (1.778 m)     Wt Readings from Last 3 Encounters:  01/27/24 98.2 kg  01/18/24 93 kg  08/14/23 93 kg    Labs: Lab Results  Component Value Date   NA 139 01/18/2024   K 4.0 01/18/2024   CL 104 01/18/2024   CO2 24  01/18/2024   GLUCOSE 93 01/18/2024   BUN 18 01/18/2024   CREATININE 0.90 01/18/2024   CALCIUM 8.9 01/18/2024   PHOS 3.7 01/18/2024   MG 2.2 01/18/2024   Lab Results  Component Value Date   INR 1.3 (H) 08/14/2023   No results found for: CHOL, HDL, LDLCALC, TRIG  GEN- The patient is well appearing, alert and oriented x 3 today.   Neck - no JVD or carotid bruit noted Lungs- Clear to ausculation bilaterally, normal work of breathing Heart- Regular rate and rhythm, no murmurs, rubs or gallops, PMI not laterally displaced Extremities- no clubbing, cyanosis, or edema Skin - no rash or ecchymosis noted   EKG- EKG Interpretation Date/Time:  Wednesday January 27 2024 11:02:26 EST Ventricular Rate:  65 PR Interval:  176 QRS Duration:  98 QT Interval:  402 QTC Calculation: 418 R Axis:   23  Text Interpretation: Normal sinus rhythm Normal ECG When  compared with ECG of 18-Jan-2024 12:54, PREVIOUS ECG IS PRESENT Confirmed by Terra Pac ([id number]) on 01/27/2024 11:42:11 AM    ECHO 01/17/2024:  1. Left ventricular ejection fraction, by estimation, is 50 to 55%. The  left ventricle has low normal function. The left ventricle demonstrates  global hypokinesis. There is mild left ventricular hypertrophy. Left  ventricular diastolic parameters are  indeterminate.   2. Right ventricular systolic function is normal. The right ventricular  size is normal.   3. The mitral valve is normal in structure. Mild mitral valve  regurgitation.   4. The aortic valve is tricuspid. Aortic valve regurgitation is mild.   CHA2DS2-VASc Score = 1  The patient's score is based upon: CHF History: 0 HTN History: 0 Diabetes History: 0 Stroke History: 0 Vascular Disease History: 1 Age Score: 0 Gender Score: 0      Assessment and Plan: 1. Persistent afib S/p TEE/DCCV on 01/18/2024.  Patient is currently in NSR.  Continue diltiazem  240 mg daily.  Patient wishes to continue anticoagulation and this is reasonable per his preference.  Continue Eliquis  5 mg twice daily.  We briefly discussed pulsed field ablation as an option for long-term rhythm control but patient declines intervention at this time.  He notes that if he were to have increased A-fib burden in the future he would then like to speak with EP regarding ablation.  High risk medication monitoring (ICD10: [id number]) Patient requires ongoing monitoring for anti-arrhythmic medication which has the potential to cause life threatening arrhythmias or AV block. ECG intervals are stable. Continue flecainide  50 mg twice daily.     Follow up 6 months for flecainide  surveillance.   Pac Terra, PA-C [facility] [address] [place], [place] [address] [phone]

## 2024-02-03 ENCOUNTER — Other Ambulatory Visit (HOSPITAL_COMMUNITY): Payer: Self-pay | Admitting: *Deleted

## 2024-02-03 MED ORDER — APIXABAN 5 MG PO TABS: 5.0000 mg | ORAL_TABLET | Freq: Two times a day (BID) | ORAL | 6 refills | Status: AC

## 2024-02-03 MED ORDER — DILTIAZEM HCL ER COATED BEADS 240 MG PO CP24: 240.0000 mg | ORAL_CAPSULE | Freq: Every day | ORAL | 2 refills | Status: AC

## 2024-02-03 MED ORDER — FLECAINIDE ACETATE 50 MG PO TABS: 50.0000 mg | ORAL_TABLET | Freq: Two times a day (BID) | ORAL | 2 refills | Status: AC

## 2024-07-27 ENCOUNTER — Ambulatory Visit (HOSPITAL_COMMUNITY): Admitting: Internal Medicine
